# Patient Record
Sex: Male | Born: 1955 | Smoking: Former smoker
Health system: Southern US, Community
[De-identification: ages and names within clinical notes are randomized; demographics above are authoritative.]

## PROBLEM LIST (undated history)

## (undated) DIAGNOSIS — I82409 Acute embolism and thrombosis of unspecified deep veins of unspecified lower extremity: Secondary | ICD-10-CM

## (undated) DIAGNOSIS — I219 Acute myocardial infarction, unspecified: Secondary | ICD-10-CM

## (undated) DIAGNOSIS — I10 Essential (primary) hypertension: Secondary | ICD-10-CM

## (undated) DIAGNOSIS — E785 Hyperlipidemia, unspecified: Secondary | ICD-10-CM

## (undated) HISTORY — DX: Acute myocardial infarction, unspecified: I21.9

## (undated) HISTORY — DX: Essential (primary) hypertension: I10

## (undated) HISTORY — DX: Acute embolism and thrombosis of unspecified deep veins of unspecified lower extremity: I82.409

## (undated) HISTORY — DX: Hyperlipidemia, unspecified: E78.5

---

## 2013-10-25 ENCOUNTER — Non-Acute Institutional Stay (SKILLED_NURSING_FACILITY): Payer: Medicare HMO | Admitting: Adult Health

## 2013-10-25 DIAGNOSIS — I6350 Cerebral infarction due to unspecified occlusion or stenosis of unspecified cerebral artery: Secondary | ICD-10-CM

## 2013-10-25 DIAGNOSIS — I82409 Acute embolism and thrombosis of unspecified deep veins of unspecified lower extremity: Secondary | ICD-10-CM

## 2013-10-25 DIAGNOSIS — I639 Cerebral infarction, unspecified: Secondary | ICD-10-CM

## 2013-10-25 DIAGNOSIS — I10 Essential (primary) hypertension: Secondary | ICD-10-CM

## 2013-10-25 DIAGNOSIS — I2510 Atherosclerotic heart disease of native coronary artery without angina pectoris: Secondary | ICD-10-CM

## 2013-10-25 DIAGNOSIS — E785 Hyperlipidemia, unspecified: Secondary | ICD-10-CM

## 2013-10-25 DIAGNOSIS — I219 Acute myocardial infarction, unspecified: Secondary | ICD-10-CM

## 2013-10-27 ENCOUNTER — Non-Acute Institutional Stay (SKILLED_NURSING_FACILITY): Payer: Medicare HMO | Admitting: Internal Medicine

## 2013-10-27 ENCOUNTER — Encounter: Payer: Self-pay | Admitting: Internal Medicine

## 2013-10-27 DIAGNOSIS — I219 Acute myocardial infarction, unspecified: Secondary | ICD-10-CM

## 2013-10-27 DIAGNOSIS — E785 Hyperlipidemia, unspecified: Secondary | ICD-10-CM | POA: Insufficient documentation

## 2013-10-27 DIAGNOSIS — I6350 Cerebral infarction due to unspecified occlusion or stenosis of unspecified cerebral artery: Secondary | ICD-10-CM

## 2013-10-27 DIAGNOSIS — I10 Essential (primary) hypertension: Secondary | ICD-10-CM | POA: Insufficient documentation

## 2013-10-27 DIAGNOSIS — I639 Cerebral infarction, unspecified: Secondary | ICD-10-CM | POA: Insufficient documentation

## 2013-10-27 DIAGNOSIS — I82409 Acute embolism and thrombosis of unspecified deep veins of unspecified lower extremity: Secondary | ICD-10-CM

## 2013-10-27 NOTE — Progress Notes (Signed)
Patient ID: Kenneth Yu, male   DOB: 30-Aug-1955, 58 y.o.   MRN: [id number]    [facility]  Chief Complaint  Patient presents with  . Hospitalization Follow-up    new admission   No Known Allergies  HPI 58 y/o male patient is here for short term rehabilitation after hospital admission from 10/16/13- 10/21/13 with acute CVA. He had right temporal and occipital stroke. Neurology was consulted and he was started on xarelto. 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 He is seen in his room today. He denies any complaints. He has been working with therapy team  Review of Systems  Constitutional: Negative for fever, chills, weight loss, malaise/fatigue and diaphoresis.  HENT: Negative for congestion, hearing loss and sore throat.   Eyes: Negative for blurred vision, double vision and discharge.  Respiratory: Negative for cough, sputum production, shortness of breath and wheezing.   Cardiovascular: Negative for chest pain, palpitations, orthopnea and leg swelling.  Gastrointestinal: Negative for heartburn, nausea, vomiting, abdominal pain, diarrhea and constipation.  Genitourinary: Negative for dysuria, urgency, frequency and flank pain.  Musculoskeletal: Negative for back pain, falls, joint pain and myalgias.  Skin: Negative for itching and rash.  Neurological: Positive for weakness. Negative for dizziness, tingling, focal weakness and headaches.  Psychiatric/Behavioral: Negative for depression and memory loss. The patient is not nervous/anxious.    Past Medical History  Diagnosis Date  . Myocardial infarction   . Hypertension   . DVT (deep venous thrombosis)   . Hyperlipidemia    Family History  Problem Relation Age of Onset  . Diabetes Sister   . Diabetes Brother    History   Social History  . Marital Status: Unknown    Spouse Name: N/A    Number of Children: N/A  . Years of Education: N/A   Occupational History  . divorced   .  lives alone    Social History Main Topics  . Smoking status: Former Games developer  . Smokeless tobacco: Not on file  . Alcohol Use: Yes  . Drug Use: Not on file  . Sexual Activity: Not on file   Other Topics Concern  . Not on file   Social History Narrative   Divorced, lives alone, smoked in past for 91 year    Outpatient Encounter Prescriptions as of 10/27/2013  Medication Sig  . acetaminophen (TYLENOL) 325 MG tablet Take 650 mg by mouth every 4 (four) hours as needed.  Marland Kitchen aspirin 81 MG tablet Take 81 mg by mouth daily.  Marland Kitchen atorvastatin (LIPITOR) 80 MG tablet Take 80 mg by mouth daily.  . bisacodyl (DULCOLAX) 10 MG suppository Place 10 mg rectally daily as needed for moderate constipation.  . docusate sodium (COLACE) 100 MG capsule Take 100 mg by mouth 2 (two) times daily.  Marland Kitchen lisinopril (PRINIVIL,ZESTRIL) 10 MG tablet Take 10 mg by mouth daily.  . magnesium hydroxide (MILK OF MAGNESIA) 800 MG/5ML suspension Take 30 mLs by mouth daily as needed for constipation.  . Rivaroxaban (XARELTO) 20 MG TABS tablet Take 20 mg by mouth daily with supper.   Physical exam BP 130/70  Pulse 88  Temp(Src) 98.1 F (36.7 C)  Resp 18  Ht 5\' 7"  (1.702 m)  Wt 237 lb (107.502 kg)  BMI 37.11 kg/m2  SpO2 96%  General- adult male in no acute distress Head- atraumatic, normocephalic Eyes- PERRLA, EOMI, no pallor, no icterus, no discharge Neck- no lymphadenopathy, no thyromegaly, no jugular vein distension, no  carotid bruit Nose- normal nasaal mucosa, no maxillary sinus tenderness, no frontal sinus tenderness Mouth- normal mucus membrane, no oral thrush, normal oropharynx Chest- no chest wall deformities, no chest wall tenderness Cardiovascular- normal s1,s2, no murmurs/ rubs/ gallops, normal distal pulses Respiratory- bilateral clear to auscultation, no wheeze, no rhonchi, no crackles Abdomen- bowel sounds present, soft, non tender, no CVA tenderness Musculoskeletal- able to move all 4 extremities, using a  cane, no spinal and paraspinal tenderness, no leg edema, right sided weakness noted more than left Neurological- no focal deficit Skin- warm and dry Psychiatry- alert and oriented to person, place and time, normal mood and affect  Labs- none available for review  Imaging and tests performed in hospital  T.chol 130, LDL 76, tg 127 Echocardiogram- no intracardiac thrombus, EF 50% with mild LVH MRI brain- acute infarct of right posterior cerebral artery involving dorsal right thalamus and posterior limb of internal capsule Carotid doppler- no significant stenosis noted Cta neck- no acute findings  Assessment/plan  Acute cva- here for rehabilitation. Will have him work with therapy team-PT and OT for gait training and strengthening exercises. Fall precautions to be taken. Continue xarelto with aspirin and statin  Hypertension- bp stable at present. Continue lisinopril 10 mg daily with baby aspirin and monitor bp. Check cmp  Hyperlipidemia- continue lipitor. Reviewed lipid panel  Constipation- last bowel movement yesterday. Monitor bowel movement. Continue colace bid with prn suppository and MOM  CAD- remains chest pain free. Continue baby aspirin and ACEI. Monitor clinically  DVT- continue xarelto, monitor for signs of bleeding. Check cbc  Family/ staff Communication: reviewed care plan with patient and nursing supervisor  Goal of care-str  Labs- cbc, cmp

## 2013-10-28 DIAGNOSIS — I2510 Atherosclerotic heart disease of native coronary artery without angina pectoris: Secondary | ICD-10-CM | POA: Insufficient documentation

## 2013-10-28 NOTE — Progress Notes (Signed)
Patient ID: Janeece AgeeWalter Kuhar, male   DOB: 04/27/1956, 58 y.o.   MRN: [id number]     [facility]  No Known Allergies   Chief Complaint  Patient presents with  . Hospitalization Follow-up    HPI:  He has been hospitalized for a cva. He has a history of a previous cva in the past. He is here at this time for short term rehab. He is not voicing any complaints today. There are no concerns being voiced by the nursing staff at this time.    Past Medical History  Diagnosis Date  . Myocardial infarction   . Hypertension   . DVT (deep venous thrombosis)   . Hyperlipidemia     No past surgical history on file.  VITAL SIGNS BP 142/70  Pulse 60  Ht 5\' 7"  (1.702 m)  Wt 237 lb (107.502 kg)  BMI 37.11 kg/m2   Patient's Medications  New Prescriptions   No medications on file  Previous Medications   ACETAMINOPHEN (TYLENOL) 325 MG TABLET    Take 650 mg by mouth every 4 (four) hours as needed.   ASPIRIN 81 MG TABLET    Take 81 mg by mouth daily.   ATORVASTATIN (LIPITOR) 80 MG TABLET    Take 80 mg by mouth daily.   BISACODYL (DULCOLAX) 10 MG SUPPOSITORY    Place 10 mg rectally daily as needed for moderate constipation.   DOCUSATE SODIUM (COLACE) 100 MG CAPSULE    Take 100 mg by mouth 2 (two) times daily.   LISINOPRIL (PRINIVIL,ZESTRIL) 10 MG TABLET    Take 10 mg by mouth daily.   MAGNESIUM HYDROXIDE (MILK OF MAGNESIA) 800 MG/5ML SUSPENSION    Take 30 mLs by mouth daily as needed for constipation.   RIVAROXABAN (XARELTO) 20 MG TABS TABLET    Take 20 mg by mouth daily with supper.  Modified Medications   No medications on file  Discontinued Medications   No medications on file    SIGNIFICANT DIAGNOSTIC EXAMS  09/2013: 2-d echo: ef 50% mild LVH  09-2013: MRI of head: acute infract right posterior cerebral artery circulation involving the dorsal right thalamus and post limb of the internal capsule  09-2013: carotid doppler: without significant hemodynamically significant stenosis    09-2013: ct of head and neck: without acute findings.     LABS REVIEWED:   10-25-13: wbc 6.4; hgb 15.6; hct 46.0; mcv 83 plt 264; glucose 92; bun 17; creat 1.3; k+5.0; na++145 Chol 177; ldl 74; trig 288     Review of Systems  Constitutional: Negative for malaise/fatigue.  Eyes: Negative for blurred vision.  Respiratory: Negative for cough and shortness of breath.   Cardiovascular: Negative for chest pain, palpitations and leg swelling.  Gastrointestinal: Negative for heartburn, abdominal pain and constipation.  Musculoskeletal: Negative for back pain, joint pain and myalgias.  Skin: Negative.   Neurological: Negative for dizziness and headaches.  Psychiatric/Behavioral: Negative for depression. The patient is not nervous/anxious.       Physical Exam  Constitutional: He appears well-developed and well-nourished. No distress.  obese  Neck: Neck supple. No JVD present. No thyromegaly present.  Cardiovascular: Normal rate, regular rhythm and intact distal pulses.   Respiratory: Effort normal and breath sounds normal. No respiratory distress. He has no wheezes.  GI: Soft. Bowel sounds are normal. He exhibits no distension. There is no tenderness.  Musculoskeletal: He exhibits no edema.  Is able to move all extremities; has some mild generalized weakness present.   Neurological: He is  alert.  Skin: Skin is warm and dry. He is not diaphoretic.  Psychiatric: He has a normal mood and affect.       ASSESSMENT/ PLAN:  1. CVA: he is presently neurologically stable; will continue therapy as indicated; will continue xarelto 20 mg daily; asa 81 mg daily and will continue to monitor his status.   2. dvt in lower extremities; he is presently stable will continue xarelto 20 mg daily  3. Hypertension: will continue lisinopril 20 mg daily and will monitor his status   4. MI/CAD: he is presently without complaint of chest pain present will continue asa 81 mg daily   5. Dyslipidemia:  will continue lipitor 80 mg daily; his last ldl was 74      Time spent with patient 50 minutes.    Synthia Innocent NP [facility]  Contact [phone] Monday through Friday 8am- 5pm  After hours call [phone]

## 2013-11-01 ENCOUNTER — Non-Acute Institutional Stay (SKILLED_NURSING_FACILITY): Payer: Medicare HMO | Admitting: Adult Health

## 2013-11-01 ENCOUNTER — Encounter: Payer: Self-pay | Admitting: Adult Health

## 2013-11-01 DIAGNOSIS — I2510 Atherosclerotic heart disease of native coronary artery without angina pectoris: Secondary | ICD-10-CM

## 2013-11-01 DIAGNOSIS — I639 Cerebral infarction, unspecified: Secondary | ICD-10-CM

## 2013-11-01 DIAGNOSIS — I6350 Cerebral infarction due to unspecified occlusion or stenosis of unspecified cerebral artery: Secondary | ICD-10-CM

## 2013-11-01 DIAGNOSIS — I10 Essential (primary) hypertension: Secondary | ICD-10-CM

## 2013-11-01 DIAGNOSIS — I82409 Acute embolism and thrombosis of unspecified deep veins of unspecified lower extremity: Secondary | ICD-10-CM

## 2013-11-01 NOTE — Progress Notes (Signed)
Patient ID: Kenneth Yu, male   DOB: [birth year], 58 y.o.   MRN: [id number]     [facility]  No Known Allergies   Chief Complaint  Patient presents with  . Discharge Note    HPI:  He is being discharged to home. He will need home health for skilled nursing for medication management. He will need ot to improve upon his level of independence with adl's due to his cva. He will need speech therapy to improve upon his short term memory problem solving due to his cva.  He is unable to leave his home safely without assistance. He will need prescriptions to be written.     Past Medical History  Diagnosis Date  . Myocardial infarction   . Hypertension   . DVT (deep venous thrombosis)   . Hyperlipidemia     No past surgical history on file.  VITAL SIGNS BP 138/90  Pulse 70  Ht 5\' 7"  (1.702 m)  Wt 237 lb (107.502 kg)  BMI 37.11 kg/m2   Patient's Medications  New Prescriptions   No medications on file  Previous Medications   ACETAMINOPHEN (TYLENOL) 325 MG TABLET    Take 650 mg by mouth every 4 (four) hours as needed.   ASPIRIN 81 MG TABLET    Take 81 mg by mouth daily.   ATORVASTATIN (LIPITOR) 80 MG TABLET    Take 80 mg by mouth daily.   BISACODYL (DULCOLAX) 10 MG SUPPOSITORY    Place 10 mg rectally daily as needed for moderate constipation.   DOCUSATE SODIUM (COLACE) 100 MG CAPSULE    Take 100 mg by mouth 2 (two) times daily.   LISINOPRIL (PRINIVIL,ZESTRIL) 10 MG TABLET    Take 10 mg by mouth daily.   MAGNESIUM HYDROXIDE (MILK OF MAGNESIA) 800 MG/5ML SUSPENSION    Take 30 mLs by mouth daily as needed for constipation.   RIVAROXABAN (XARELTO) 20 MG TABS TABLET    Take 20 mg by mouth daily with supper.  Modified Medications   No medications on file  Discontinued Medications   No medications on file    SIGNIFICANT DIAGNOSTIC EXAMS  09/2013: 2-d echo: ef 50% mild LVH  09-2013: MRI of head: acute infract right posterior cerebral artery circulation involving the dorsal  right thalamus and post limb of the internal capsule  09-2013: carotid doppler: without significant hemodynamically significant stenosis   09-2013: ct of head and neck: without acute findings.     LABS REVIEWED:   10-25-13: wbc 6.4; hgb 15.6; hct 46.0; mcv 83 plt 264; glucose 92; bun 17; creat 1.3; k+5.0; na++145 Chol 177; ldl 74; trig 288     Review of Systems  Constitutional: Negative for malaise/fatigue.  Eyes: Negative for blurred vision.  Respiratory: Negative for cough and shortness of breath.   Cardiovascular: Negative for chest pain, palpitations and leg swelling.  Gastrointestinal: Negative for heartburn, abdominal pain and constipation.  Musculoskeletal: Negative for back pain, joint pain and myalgias.  Skin: Negative.   Neurological: Negative for dizziness and headaches.  Psychiatric/Behavioral: Negative for depression. The patient is not nervous/anxious.       Physical Exam  Constitutional: He appears well-developed and well-nourished. No distress.  obese  Neck: Neck supple. No JVD present. No thyromegaly present.  Cardiovascular: Normal rate, regular rhythm and intact distal pulses.   Respiratory: Effort normal and breath sounds normal. No respiratory distress. He has no wheezes.  GI: Soft. Bowel sounds are normal. He exhibits no distension. There is no tenderness.  Musculoskeletal: He exhibits no edema.  Is able to move all extremities Neurological: He is alert.  Skin: Skin is warm and dry. He is not diaphoretic.  Psychiatric: He has a normal mood and affect.      ASSESSMENT/ PLAN:  Will discharge to home with home health for ot/st/nursing. He will not need dme. His prescriptions have been written.   Time spent with patient 40 minutes.        Synthia Innocenteborah Zeniya Lapidus NP [facility]  Contact [phone] Monday through Friday 8am- 5pm  After hours call [phone]

## 2021-01-28 ENCOUNTER — Encounter (HOSPITAL_COMMUNITY)
Admission: EM | Disposition: A | Discharge status: Skilled Nursing Facility | Payer: Self-pay | Source: Home / Self Care | Attending: Neurology

## 2021-01-28 ENCOUNTER — Other Ambulatory Visit (HOSPITAL_COMMUNITY): Payer: Self-pay

## 2021-01-28 ENCOUNTER — Inpatient Hospital Stay (HOSPITAL_COMMUNITY): Payer: Medicare (Managed Care) | Admitting: Critical Care Medicine

## 2021-01-28 ENCOUNTER — Encounter (HOSPITAL_COMMUNITY): Payer: Self-pay | Admitting: Emergency Medicine

## 2021-01-28 ENCOUNTER — Other Ambulatory Visit: Payer: Self-pay

## 2021-01-28 ENCOUNTER — Inpatient Hospital Stay (HOSPITAL_COMMUNITY)
Admission: EM | Admit: 2021-01-28 | Discharge: 2021-02-08 | DRG: 023 | Disposition: A | Discharge status: Skilled Nursing Facility | Payer: Medicare (Managed Care) | Attending: Neurology | Admitting: Neurology

## 2021-01-28 ENCOUNTER — Emergency Department (HOSPITAL_COMMUNITY): Payer: Medicare (Managed Care)

## 2021-01-28 ENCOUNTER — Inpatient Hospital Stay (HOSPITAL_COMMUNITY): Payer: Medicare (Managed Care)

## 2021-01-28 DIAGNOSIS — I252 Old myocardial infarction: Secondary | ICD-10-CM

## 2021-01-28 DIAGNOSIS — D72829 Elevated white blood cell count, unspecified: Secondary | ICD-10-CM | POA: Diagnosis present

## 2021-01-28 DIAGNOSIS — I480 Paroxysmal atrial fibrillation: Secondary | ICD-10-CM | POA: Diagnosis present

## 2021-01-28 DIAGNOSIS — I10 Essential (primary) hypertension: Secondary | ICD-10-CM | POA: Diagnosis present

## 2021-01-28 DIAGNOSIS — Z0189 Encounter for other specified special examinations: Secondary | ICD-10-CM

## 2021-01-28 DIAGNOSIS — R471 Dysarthria and anarthria: Secondary | ICD-10-CM | POA: Diagnosis present

## 2021-01-28 DIAGNOSIS — E785 Hyperlipidemia, unspecified: Secondary | ICD-10-CM | POA: Diagnosis present

## 2021-01-28 DIAGNOSIS — G8191 Hemiplegia, unspecified affecting right dominant side: Secondary | ICD-10-CM | POA: Diagnosis present

## 2021-01-28 DIAGNOSIS — I6389 Other cerebral infarction: Secondary | ICD-10-CM | POA: Diagnosis not present

## 2021-01-28 DIAGNOSIS — Z87891 Personal history of nicotine dependence: Secondary | ICD-10-CM | POA: Diagnosis not present

## 2021-01-28 DIAGNOSIS — Z01818 Encounter for other preprocedural examination: Secondary | ICD-10-CM

## 2021-01-28 DIAGNOSIS — R0789 Other chest pain: Secondary | ICD-10-CM | POA: Diagnosis not present

## 2021-01-28 DIAGNOSIS — Z7901 Long term (current) use of anticoagulants: Secondary | ICD-10-CM | POA: Diagnosis not present

## 2021-01-28 DIAGNOSIS — Z79899 Other long term (current) drug therapy: Secondary | ICD-10-CM

## 2021-01-28 DIAGNOSIS — I611 Nontraumatic intracerebral hemorrhage in hemisphere, cortical: Secondary | ICD-10-CM | POA: Diagnosis present

## 2021-01-28 DIAGNOSIS — Z20822 Contact with and (suspected) exposure to covid-19: Secondary | ICD-10-CM | POA: Diagnosis present

## 2021-01-28 DIAGNOSIS — R0689 Other abnormalities of breathing: Secondary | ICD-10-CM | POA: Diagnosis not present

## 2021-01-28 DIAGNOSIS — R519 Headache, unspecified: Secondary | ICD-10-CM | POA: Diagnosis not present

## 2021-01-28 DIAGNOSIS — Z86718 Personal history of other venous thrombosis and embolism: Secondary | ICD-10-CM

## 2021-01-28 DIAGNOSIS — R531 Weakness: Secondary | ICD-10-CM | POA: Diagnosis not present

## 2021-01-28 DIAGNOSIS — J9601 Acute respiratory failure with hypoxia: Secondary | ICD-10-CM | POA: Diagnosis not present

## 2021-01-28 DIAGNOSIS — I63412 Cerebral infarction due to embolism of left middle cerebral artery: Secondary | ICD-10-CM | POA: Diagnosis present

## 2021-01-28 DIAGNOSIS — R4702 Dysphasia: Secondary | ICD-10-CM | POA: Diagnosis not present

## 2021-01-28 DIAGNOSIS — I639 Cerebral infarction, unspecified: Secondary | ICD-10-CM | POA: Diagnosis present

## 2021-01-28 DIAGNOSIS — R1310 Dysphagia, unspecified: Secondary | ICD-10-CM | POA: Diagnosis present

## 2021-01-28 DIAGNOSIS — I2510 Atherosclerotic heart disease of native coronary artery without angina pectoris: Secondary | ICD-10-CM | POA: Diagnosis present

## 2021-01-28 DIAGNOSIS — Z833 Family history of diabetes mellitus: Secondary | ICD-10-CM

## 2021-01-28 DIAGNOSIS — Z8673 Personal history of transient ischemic attack (TIA), and cerebral infarction without residual deficits: Secondary | ICD-10-CM

## 2021-01-28 DIAGNOSIS — I6602 Occlusion and stenosis of left middle cerebral artery: Secondary | ICD-10-CM | POA: Diagnosis present

## 2021-01-28 DIAGNOSIS — E7800 Pure hypercholesterolemia, unspecified: Secondary | ICD-10-CM | POA: Diagnosis not present

## 2021-01-28 DIAGNOSIS — I63512 Cerebral infarction due to unspecified occlusion or stenosis of left middle cerebral artery: Secondary | ICD-10-CM | POA: Diagnosis not present

## 2021-01-28 DIAGNOSIS — R29720 NIHSS score 20: Secondary | ICD-10-CM | POA: Diagnosis present

## 2021-01-28 DIAGNOSIS — R4701 Aphasia: Secondary | ICD-10-CM | POA: Diagnosis present

## 2021-01-28 DIAGNOSIS — M6282 Rhabdomyolysis: Secondary | ICD-10-CM | POA: Diagnosis present

## 2021-01-28 DIAGNOSIS — Z7982 Long term (current) use of aspirin: Secondary | ICD-10-CM | POA: Diagnosis not present

## 2021-01-28 HISTORY — PX: IR PERCUTANEOUS ART THROMBECTOMY/INFUSION INTRACRANIAL INC DIAG ANGIO: IMG6087

## 2021-01-28 HISTORY — PX: IR CT HEAD LTD: IMG2386

## 2021-01-28 HISTORY — PX: RADIOLOGY WITH ANESTHESIA: SHX6223

## 2021-01-28 LAB — COMPREHENSIVE METABOLIC PANEL
ALT: 40 U/L (ref 0–44)
AST: 91 U/L — ABNORMAL HIGH (ref 15–41)
Albumin: 3.7 g/dL (ref 3.5–5.0)
Alkaline Phosphatase: 46 U/L (ref 38–126)
Anion gap: 11 (ref 5–15)
BUN: 13 mg/dL (ref 8–23)
CO2: 26 mmol/L (ref 22–32)
Calcium: 9.1 mg/dL (ref 8.9–10.3)
Chloride: 105 mmol/L (ref 98–111)
Creatinine, Ser: 1.13 mg/dL (ref 0.61–1.24)
GFR, Estimated: >60 mL/min (ref >60)
Glucose, Bld: 101 mg/dL — ABNORMAL HIGH (ref 70–99)
Potassium: 3.9 mmol/L (ref 3.5–5.1)
Sodium: 142 mmol/L (ref 135–145)
Total Bilirubin: 1.3 mg/dL — ABNORMAL HIGH (ref 0.3–1.2)
Total Protein: 6.5 g/dL (ref 6.5–8.1)

## 2021-01-28 LAB — I-STAT CHEM 8, ED
BUN: 13 mg/dL (ref 8–23)
Calcium, Ion: 1.17 mmol/L (ref 1.15–1.40)
Chloride: 105 mmol/L (ref 98–111)
Creatinine, Ser: 1 mg/dL (ref 0.61–1.24)
Glucose, Bld: 96 mg/dL (ref 70–99)
HCT: 49 % (ref 39.0–52.0)
Hemoglobin: 16.7 g/dL (ref 13.0–17.0)
Potassium: 3.8 mmol/L (ref 3.5–5.1)
Sodium: 143 mmol/L (ref 135–145)
TCO2: 25 mmol/L (ref 22–32)

## 2021-01-28 LAB — PROTIME-INR
INR: 1.1 (ref 0.8–1.2)
Prothrombin Time: 13.8 seconds (ref 11.4–15.2)

## 2021-01-28 LAB — DIFFERENTIAL
Abs Immature Granulocytes: 0.06 10*3/uL (ref 0.00–0.07)
Basophils Absolute: 0 10*3/uL (ref 0.0–0.1)
Basophils Relative: 0 %
Eosinophils Absolute: 0.3 10*3/uL (ref 0.0–0.5)
Eosinophils Relative: 3 %
Immature Granulocytes: 1 %
Lymphocytes Relative: 6 %
Lymphs Abs: 0.7 10*3/uL (ref 0.7–4.0)
Monocytes Absolute: 0.7 10*3/uL (ref 0.1–1.0)
Monocytes Relative: 6 %
Neutro Abs: 10.3 10*3/uL — ABNORMAL HIGH (ref 1.7–7.7)
Neutrophils Relative %: 84 %

## 2021-01-28 LAB — CBG MONITORING, ED: Glucose-Capillary: 94 mg/dL (ref 70–99)

## 2021-01-28 LAB — CBC
HCT: 44.5 % (ref 39.0–52.0)
Hemoglobin: 15.3 g/dL (ref 13.0–17.0)
MCH: 29.4 pg (ref 26.0–34.0)
MCHC: 34.4 g/dL (ref 30.0–36.0)
MCV: 85.4 fL (ref 80.0–100.0)
Platelets: 249 10*3/uL (ref 150–400)
RBC: 5.21 MIL/uL (ref 4.22–5.81)
RDW: 14.1 % (ref 11.5–15.5)
WBC: 12.1 10*3/uL — ABNORMAL HIGH (ref 4.0–10.5)
nRBC: 0 % (ref 0.0–0.2)

## 2021-01-28 LAB — RESP PANEL BY RT-PCR (FLU A&B, COVID) ARPGX2
Influenza A by PCR: NEGATIVE
Influenza B by PCR: NEGATIVE
SARS Coronavirus 2 by RT PCR: NEGATIVE

## 2021-01-28 LAB — APTT: aPTT: 24 seconds (ref 24–36)

## 2021-01-28 LAB — MRSA NEXT GEN BY PCR, NASAL: MRSA by PCR Next Gen: NOT DETECTED

## 2021-01-28 LAB — HIV ANTIBODY (ROUTINE TESTING W REFLEX): HIV Screen 4th Generation wRfx: NONREACTIVE

## 2021-01-28 LAB — CK: Total CK: 5752 U/L — ABNORMAL HIGH (ref 49–397)

## 2021-01-28 SURGERY — IR WITH ANESTHESIA
Anesthesia: General

## 2021-01-28 MED ORDER — STROKE: EARLY STAGES OF RECOVERY BOOK
Freq: Once | Status: AC
  Filled 2021-01-28: qty 1

## 2021-01-28 MED ORDER — PROPOFOL 10 MG/ML IV BOLUS
INTRAVENOUS | Status: DC | PRN
  Administered 2021-01-28: 150 mg via INTRAVENOUS

## 2021-01-28 MED ORDER — IOHEXOL 300 MG/ML  SOLN
100.0000 mL | Freq: Once | INTRAMUSCULAR | Status: AC | PRN
  Administered 2021-01-28: 50 mL via INTRA_ARTERIAL

## 2021-01-28 MED ORDER — SODIUM CHLORIDE 0.9% FLUSH
3.0000 mL | Freq: Once | INTRAVENOUS | Status: AC
  Administered 2021-01-28: 3 mL via INTRAVENOUS

## 2021-01-28 MED ORDER — SODIUM CHLORIDE (PF) 0.9 % IJ SOLN
INTRAVENOUS | Status: AC | PRN
  Administered 2021-01-28 (×5): 25 ug via INTRA_ARTERIAL

## 2021-01-28 MED ORDER — ACETAMINOPHEN 325 MG PO TABS
650.0000 mg | ORAL_TABLET | ORAL | Status: DC | PRN
  Administered 2021-01-29 – 2021-02-06 (×7): 650 mg via ORAL
  Filled 2021-01-28 (×8): qty 2

## 2021-01-28 MED ORDER — ACETAMINOPHEN 160 MG/5ML PO SOLN: 650.0000 mg | ORAL | Status: DC | PRN

## 2021-01-28 MED ORDER — IOHEXOL 350 MG/ML SOLN
80.0000 mL | Freq: Once | INTRAVENOUS | Status: AC | PRN
  Administered 2021-01-28: 80 mL via INTRAVENOUS

## 2021-01-28 MED ORDER — ASPIRIN 300 MG RE SUPP
300.0000 mg | Freq: Every day | RECTAL | Status: DC
  Administered 2021-01-28 – 2021-01-29 (×2): 300 mg via RECTAL
  Filled 2021-01-28 (×3): qty 1

## 2021-01-28 MED ORDER — PROPOFOL 1000 MG/100ML IV EMUL
5.0000 ug/kg/min | INTRAVENOUS | Status: DC
Start: 2021-01-28 — End: 2021-01-29
  Administered 2021-01-28: 20 ug/kg/min via INTRAVENOUS
  Administered 2021-01-29: 40 ug/kg/min via INTRAVENOUS
  Filled 2021-01-28 (×3): qty 100

## 2021-01-28 MED ORDER — ONDANSETRON HCL 4 MG/2ML IJ SOLN
INTRAMUSCULAR | Status: DC | PRN
  Administered 2021-01-28: 4 mg via INTRAVENOUS

## 2021-01-28 MED ORDER — ACETAMINOPHEN 160 MG/5ML PO SOLN
650.0000 mg | ORAL | Status: DC | PRN
  Administered 2021-01-28: 650 mg
  Filled 2021-01-28: qty 20.3

## 2021-01-28 MED ORDER — CEFAZOLIN SODIUM-DEXTROSE 2-4 GM/100ML-% IV SOLN
INTRAVENOUS | Status: AC
  Filled 2021-01-28: qty 100

## 2021-01-28 MED ORDER — NITROGLYCERIN 1 MG/10 ML FOR IR/CATH LAB
INTRA_ARTERIAL | Status: AC
  Filled 2021-01-28: qty 10

## 2021-01-28 MED ORDER — ACETAMINOPHEN 325 MG PO TABS: 650.0000 mg | ORAL_TABLET | ORAL | Status: DC | PRN

## 2021-01-28 MED ORDER — LACTATED RINGERS IV SOLN: INTRAVENOUS | Status: DC | PRN

## 2021-01-28 MED ORDER — LIDOCAINE 2% (20 MG/ML) 5 ML SYRINGE
INTRAMUSCULAR | Status: DC | PRN
  Administered 2021-01-28: 80 mg via INTRAVENOUS

## 2021-01-28 MED ORDER — SODIUM CHLORIDE 0.9 % IV SOLN: INTRAVENOUS | Status: DC

## 2021-01-28 MED ORDER — PHENYLEPHRINE HCL-NACL 10-0.9 MG/250ML-% IV SOLN
INTRAVENOUS | Status: DC | PRN
  Administered 2021-01-28: 10 ug/min via INTRAVENOUS

## 2021-01-28 MED ORDER — CEFAZOLIN SODIUM-DEXTROSE 2-3 GM-%(50ML) IV SOLR
INTRAVENOUS | Status: DC | PRN
  Administered 2021-01-28: 2 g via INTRAVENOUS

## 2021-01-28 MED ORDER — CHLORHEXIDINE GLUCONATE 0.12% ORAL RINSE (MEDLINE KIT)
15.0000 mL | Freq: Two times a day (BID) | OROMUCOSAL | Status: DC
  Administered 2021-01-28 – 2021-02-08 (×21): 15 mL via OROMUCOSAL

## 2021-01-28 MED ORDER — ORAL CARE MOUTH RINSE
15.0000 mL | OROMUCOSAL | Status: DC
  Administered 2021-01-28 – 2021-01-30 (×17): 15 mL via OROMUCOSAL

## 2021-01-28 MED ORDER — CLEVIDIPINE BUTYRATE 0.5 MG/ML IV EMUL
0.0000 mg/h | INTRAVENOUS | Status: AC
  Administered 2021-01-28: 2 mg/h via INTRAVENOUS
  Administered 2021-01-29 (×2): 18 mg/h via INTRAVENOUS
  Administered 2021-01-29: 2 mg/h via INTRAVENOUS
  Administered 2021-01-29: 18 mg/h via INTRAVENOUS
  Administered 2021-01-29: 16 mg/h via INTRAVENOUS
  Filled 2021-01-28 (×7): qty 50

## 2021-01-28 MED ORDER — ACETAMINOPHEN 650 MG RE SUPP
650.0000 mg | RECTAL | Status: DC | PRN
  Administered 2021-02-01: 650 mg via RECTAL
  Filled 2021-01-28: qty 1

## 2021-01-28 MED ORDER — FENTANYL 2500MCG IN NS 250ML (10MCG/ML) PREMIX INFUSION
INTRAVENOUS | Status: AC
  Administered 2021-01-28: 50 ug/h via INTRAVENOUS
  Filled 2021-01-28: qty 250

## 2021-01-28 MED ORDER — ROCURONIUM BROMIDE 10 MG/ML (PF) SYRINGE
PREFILLED_SYRINGE | INTRAVENOUS | Status: DC | PRN
  Administered 2021-01-28: 50 mg via INTRAVENOUS

## 2021-01-28 MED ORDER — ACETAMINOPHEN 650 MG RE SUPP: 650.0000 mg | RECTAL | Status: DC | PRN

## 2021-01-28 MED ORDER — DEXAMETHASONE SODIUM PHOSPHATE 10 MG/ML IJ SOLN
INTRAMUSCULAR | Status: DC | PRN
  Administered 2021-01-28: 4 mg via INTRAVENOUS

## 2021-01-28 MED ORDER — FAMOTIDINE IN NACL 20-0.9 MG/50ML-% IV SOLN
20.0000 mg | Freq: Two times a day (BID) | INTRAVENOUS | Status: DC
  Administered 2021-01-28 – 2021-01-29 (×3): 20 mg via INTRAVENOUS
  Filled 2021-01-28 (×4): qty 50

## 2021-01-28 MED ORDER — CHLORHEXIDINE GLUCONATE CLOTH 2 % EX PADS
6.0000 | MEDICATED_PAD | Freq: Every day | CUTANEOUS | Status: DC
  Administered 2021-01-29 – 2021-02-01 (×4): 6 via TOPICAL

## 2021-01-28 MED ORDER — FENTANYL 2500MCG IN NS 250ML (10MCG/ML) PREMIX INFUSION: 0.0000 ug/h | INTRAVENOUS | Status: DC

## 2021-01-28 MED ORDER — IOHEXOL 300 MG/ML  SOLN
50.0000 mL | Freq: Once | INTRAMUSCULAR | Status: AC | PRN
  Administered 2021-01-28: 10 mL via INTRA_ARTERIAL

## 2021-01-28 MED ORDER — SUCCINYLCHOLINE CHLORIDE 200 MG/10ML IV SOSY
PREFILLED_SYRINGE | INTRAVENOUS | Status: DC | PRN
  Administered 2021-01-28: 120 mg via INTRAVENOUS

## 2021-01-28 MED ORDER — ALBUTEROL SULFATE (2.5 MG/3ML) 0.083% IN NEBU: 2.5000 mg | INHALATION_SOLUTION | RESPIRATORY_TRACT | Status: DC | PRN

## 2021-01-28 MED ORDER — PROPOFOL 500 MG/50ML IV EMUL
INTRAVENOUS | Status: DC | PRN
  Administered 2021-01-28: 50 ug/kg/min via INTRAVENOUS

## 2021-01-28 NOTE — Progress Notes (Signed)
RT assisted with transportation of this pt from PACU to 4N19 while on full ventilatory support with no complications and SVS. RN at bedside at this time.

## 2021-01-28 NOTE — Sedation Documentation (Signed)
Spoke with Susie, RN, 4N Charge, not bed available at this time. Pt will go to PACU.

## 2021-01-28 NOTE — Anesthesia Preprocedure Evaluation (Signed)
Anesthesia Evaluation  Patient identified by MRN, date of birth, ID band Patient awake    Reviewed: Allergy & Precautions, NPO status , Patient's Chart, lab work & pertinent test results  Airway Mallampati: II  TM Distance: >3 FB     Dental   Pulmonary former smoker,    breath sounds clear to auscultation       Cardiovascular hypertension, + CAD and + Past MI   Rhythm:Regular Rate:Normal     Neuro/Psych CVA    GI/Hepatic   Endo/Other    Renal/GU      Musculoskeletal   Abdominal   Peds  Hematology   Anesthesia Other Findings   Reproductive/Obstetrics                             Anesthesia Physical Anesthesia Plan  ASA: 3 and emergent  Anesthesia Plan: General   Post-op Pain Management:    Induction: Intravenous  PONV Risk Score and Plan: 2 and Ondansetron and Dexamethasone  Airway Management Planned: Oral ETT  Additional Equipment:   Intra-op Plan:   Post-operative Plan: Possible Post-op intubation/ventilation  Informed Consent:   Plan Discussed with: CRNA, Anesthesiologist and Surgeon  Anesthesia Plan Comments:         Anesthesia Quick Evaluation

## 2021-01-28 NOTE — Anesthesia Postprocedure Evaluation (Signed)
Anesthesia Post Note  Patient: Adan Baehr  Procedure(s) Performed: IR WITH ANESTHESIA -     Patient location during evaluation: PACU Anesthesia Type: General Level of consciousness: patient remains intubated per anesthesia plan Pain management: pain level controlled Vital Signs Assessment: post-procedure vital signs reviewed and stable Respiratory status: patient remains intubated per anesthesia plan Cardiovascular status: stable Postop Assessment: no apparent nausea or vomiting Anesthetic complications: no   No notable events documented.  Last Vitals:  Vitals:   01/28/21 1642 01/28/21 1644  BP: (!) 149/91 (!) 149/91  Pulse: 78 74  Resp: 15 15  Temp:  37.2 C  SpO2: 100% 100%    Last Pain:  Vitals:   01/28/21 1629  PainSc: Asleep                 Deiondra Denley

## 2021-01-28 NOTE — H&P (Signed)
Neurology H&P   CC: Right sided weakness   History is obtained from: EMS   HPI: Kenneth Yu is a 65 y.o. male who was last known well at 1900 on 07/03. He spoke on the phone with clear speech at that time. This morning, his ex-wife went to see him and found him aphasic with right sided weakness. He has a history of stroke, but was able to live alone, though he did have people who would check on him.  Due to his deficits, he was taken for an emergent CT/CTA/CTP.  There was infarct on the CT, but it seems there is a large area of penumbra and the CT perfusion is favorable for intervention.  He was therefore taken for emergent thrombectomy.  I tried calling his daughter and brother but was unable to reach them and therefore the procedure was done with emergency consent.    He has a history of atrial fibrillation and is supposed to be on anticoagulation with Coumadin, but apparently EMS found multiple bottles strewn around and he looks to be noncompliant per EMS assessment.   LKW: 1900 tpa given?: no, out of window.  Premorbid modified rankin scale: unclear but lives alone. NIHSS: 20   ROS: Unable to obtain due to altered mental status.   Past Medical History:  Diagnosis Date   DVT (deep venous thrombosis) (HCC)    Hyperlipidemia    Hypertension    Myocardial infarction [facility])      Family History  Problem Relation Age of Onset   Diabetes Sister    Diabetes Brother      Social History:  reports that he has quit smoking. He does not have any smokeless tobacco history on file. He reports current alcohol use. No history on file for drug use.      Exam: Current vital signs: BP (!) 170/101   Pulse (!) 106   Resp (!) 24   Wt 94 kg   SpO2 95%   BMI 32.46 kg/m    Physical Exam  Constitutional: Appears well-developed and well-nourished.  Psych: Affect appropriate to situation Eyes: No scleral injection HENT: No OP obstrucion Head: Normocephalic.  Cardiovascular: Normal  rate and regular rhythm.  Respiratory: Effort normal and breath sounds normal to anterior ascultation GI: Soft.  No distension. There is no tenderness.  Skin: He has some erythema over the left leg, likely from being down.  Neuro: Mental Status: Patient is awake, alert, densely aphasic, when asked to show thumb he shows his whole hand. Cranial Nerves: II: Visual Fields are full. Pupils are equal, round, and reactive to light. III,IV, VI: EOMI without ptosis or diploplia. V: Facial sensation is symmetric to temperature VII: Facial movement is symmetric. VIII: hearing is intact to voice X: Uvula elevates symmetrically XI: Shoulder shrug is symmetric. XII: tongue is midline without atrophy or fasciculations. Motor: Tone is normal. Bulk is normal. 5/5 strength was present on the left.  He has no movement on the right Sensory: He responds less on the right than left.  Cerebellar: Does not perform.   I have reviewed labs in epic and the pertinent results are: CMP is relatively unremarkable other than a mildly elevated AST. CBC with mild leukocytosis  I have reviewed the images obtained: CT head/CTA/CT perfusion-M2 occlusion with favorable CTP profile  Primary Diagnosis:  Cerebral infarction due to embolism of  left middle cerebral artery.   Secondary Diagnosis: Paroxysmal atrial fibrillation Essential hypertension  Impression: 65 year old male with embolic left MCA stroke in  the setting of a history of paroxysmal atrial fibrillation, supposed to be on anticoagulation with Coumadin though of presumed noncompliance given normal INR.  He is going for emergent thrombectomy.  Plan: - HgbA1c, fasting lipid panel - MRI  of the brain without contrast - Frequent neuro checks - Echocardiogram -Aspirin 325 mg p.o. or 300 mg PR - Risk factor modification - Telemetry monitoring - PT consult, OT consult, Speech consult - Stroke team to follow    This patient is critically ill and at  significant risk of neurological worsening, death and care requires constant monitoring of vital signs, hemodynamics,respiratory and cardiac monitoring, neurological assessment, discussion with family, other specialists and medical decision making of high complexity. I spent 55 minutes of neurocritical care time  in the care of  this patient. This was time spent independent of any time provided by nurse practitioner or PA.  Ritta Slot, MD [facility] [phone]  If 7pm- 7am, please page neurology on call as listed in AMION.

## 2021-01-28 NOTE — Anesthesia Procedure Notes (Addendum)
Procedure Name: Intubation Date/Time: 01/28/2021 1:16 PM Performed by: Rachel Moulds, CRNA Pre-anesthesia Checklist: Patient identified, Emergency Drugs available, Suction available and Patient being monitored Patient Re-evaluated:Patient Re-evaluated prior to induction Oxygen Delivery Method: Circle system utilized Preoxygenation: Pre-oxygenation with 100% oxygen Induction Type: IV induction Ventilation: Mask ventilation without difficulty Laryngoscope Size: Glidescope Grade View: Grade I Tube type: Oral Tube size: 8.0 mm Number of attempts: 1 Airway Equipment and Method: Stylet and Oral airway Placement Confirmation: ETT inserted through vocal cords under direct vision, positive ETCO2 and breath sounds checked- equal and bilateral Secured at: 25 cm Tube secured with: Tape Dental Injury: Teeth and Oropharynx as per pre-operative assessment

## 2021-01-28 NOTE — ED Notes (Signed)
TO IR NOW 

## 2021-01-28 NOTE — Consult Note (Signed)
NAME:  Kenneth Yu, MRN:  [id number], DOB:  [birth year], LOS: 0 ADMISSION DATE:  01/28/2021, CONSULTATION DATE: 01/28/2021 REFERRING MD:  Julieanne Cotton, MD , CHIEF COMPLAINT: Right-sided weakness  History of Present Illness:  Patient is currently sedated and intubated so most of the history is obtained from chart review 65 year old male with history of hypertension and hyperlipidemia who was brought in the emergency department with complaint of right-sided weakness and aphasia. Stroke code was called, patient had CT head which was negative for intracranial hemorrhage, positive for left MCA stroke, CTA head and neck showed M2 occlusion, patient did not receive tPA because he was out of window, he underwent thrombectomy with TICI 3 results, postprocedure patient remained on ventilator, PCCM was consulted for help with evaluation and management  Pertinent  Medical History   Past Medical History:  Diagnosis Date   DVT (deep venous thrombosis) (HCC)    Hyperlipidemia    Hypertension    Myocardial infarction (HCC)      Significant Hospital Events: Including procedures, antibiotic start and stop dates in addition to other pertinent events   Underwent mechanical thrombectomy  Interim History / Subjective:    Objective   Blood pressure (!) 155/99, pulse 82, temperature 98.6 F (37 C), resp. rate 15, weight 94 kg, SpO2 100 %.    Vent Mode: PRVC FiO2 (%):  [50 %-100 %] 50 % Set Rate:  [15 bmp-18 bmp] 15 bmp Vt Set:  [560 mL] 560 mL PEEP:  [5 cmH20] 5 cmH20 Plateau Pressure:  [17 cmH20] 17 cmH20   Intake/Output Summary (Last 24 hours) at 01/28/2021 1550 Last data filed at 01/28/2021 1519 Gross per 24 hour  Intake --  Output 15 ml  Net -15 ml   Filed Weights   01/28/21 1200  Weight: 94 kg    Examination: Physical exam: General: Crtitically ill-appearing male, orally intubated HEENT: [place]/AT, eyes anicteric.  ETT and OGT in place Neuro: Sedated, not following commands.  Eyes  are closed.  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 Acute respiratory insufficiency postprocedure Hypertension Hyperlipidemia History of DVT on anticoagulation  Admit to neuro ICU Continue neuro watch every hour Stroke team is following Continue secondary stroke prophylaxis Continue lung protective ventilation VAP bundle Minimize sedation with RASS goal -1/-2 until he gets to the ICU Closely monitor blood pressure, SBP goal 120-140 Continue atorvastatin Patient was on Xarelto, which will be kept on hold for now, will resume once stable  Best Practice (right click and "Reselect all SmartList Selections" daily)   Diet/type: NPO DVT prophylaxis: DOAC GI prophylaxis: H2B Lines: N/A Foley:  N/A Code Status:  full code Last date of multidisciplinary goals of care discussion [Per primary team]  Labs   CBC: Recent Labs  Lab 01/28/21 1210 01/28/21 1218  WBC 12.1*  --   NEUTROABS 10.3*  --   HGB 15.3 16.7  HCT 44.5 49.0  MCV 85.4  --   PLT 249  --     Basic Metabolic Panel: Recent Labs  Lab 01/28/21 1210 01/28/21 1218  NA 142 143  K 3.9 3.8  CL 105 105  CO2 26  --   GLUCOSE 101* 96  BUN 13 13  CREATININE 1.13 1.00  CALCIUM 9.1  --    GFR: CrCl cannot be calculated (Unknown ideal weight.). Recent Labs  Lab 01/28/21 1210  WBC 12.1*    Liver Function Tests: Recent Labs  Lab 01/28/21 1210  AST 91*  ALT 40  ALKPHOS 46  BILITOT 1.3*  PROT 6.5  ALBUMIN 3.7   No results for input(s): LIPASE, AMYLASE in the last 168 hours. No results for input(s): AMMONIA in the last 168 hours.  ABG    Component Value Date/Time   TCO2 25 01/28/2021 1218     Coagulation  Profile: Recent Labs  Lab 01/28/21 1210  INR 1.1    Cardiac Enzymes: No results for input(s): CKTOTAL, CKMB, CKMBINDEX, TROPONINI in the last 168 hours.  HbA1C: No results found for: HGBA1C  CBG: Recent Labs  Lab 01/28/21 1208  GLUCAP 94    Review of Systems:   Unable to obtain as patient is intubated and sedated  Past Medical History:  He,  has a past medical history of DVT (deep venous thrombosis) (HCC), Hyperlipidemia, Hypertension, and Myocardial infarction (HCC).   Surgical History:  No past surgical history on file.   Social History:   reports that he has quit smoking. He does not have any smokeless tobacco history on file. He reports current alcohol use.   Family History:  His family history includes Diabetes in his brother and sister.   Allergies No Known Allergies   Home Medications  Prior to Admission medications   Medication Sig Start Date End Date Taking? Authorizing Provider  acetaminophen (TYLENOL) 325 MG tablet Take 650 mg by mouth every 4 (four) hours as needed.    [provider]  aspirin 81 MG tablet Take 81 mg by mouth daily.    [provider]  atorvastatin (LIPITOR) 80 MG tablet Take 80 mg by mouth daily.    [provider]  bisacodyl (DULCOLAX) 10 MG suppository Place 10 mg rectally daily as needed for moderate constipation.    [provider]  docusate sodium (COLACE) 100 MG capsule Take 100 mg by mouth 2 (two) times daily.    [provider]  lisinopril (PRINIVIL,ZESTRIL) 10 MG tablet Take 10 mg by mouth daily.    [provider]  magnesium hydroxide (MILK OF MAGNESIA) 800 MG/5ML suspension Take 30 mLs by mouth daily as needed for constipation.    [provider]  rivaroxaban (XARELTO) 20 MG TABS tablet Take 20 mg by mouth daily with supper.    [provider]     Total critical care time: 35 minutes  Performed by: Cheri Fowler   Critical care time was exclusive of  separately billable procedures and treating other patients.   Critical care was necessary to treat or prevent imminent or life-threatening deterioration.   Critical care was time spent personally by me on the following activities: development of treatment plan with patient and/or surrogate as well as nursing, discussions with consultants, evaluation of patient's response to treatment, examination of patient, obtaining history from patient or surrogate, ordering and performing treatments and interventions, ordering and review of laboratory studies, ordering and review of radiographic studies, pulse oximetry and re-evaluation of patient's condition.   Cheri Fowler MD [facility] See Amion for pager If no response to pager, please call [phone] until 7pm After 7pm, Please call E-link [phone]

## 2021-01-28 NOTE — Procedures (Signed)
S/P Lt common carotid arteriogram . RT CFAapproach  S/P complete revascularization of occluded Lt MCA M2 branch of  Sup division with x 1 pass with 4 mm x 40 mm solitaireX retriever and contact aspiration acieving a TICI 3 revascularization. Post CT No ICH  21F sheath removed. Distal pulses dopplerable bilaterally, less prominent RT DP Patient left intubated awaiting Covid test results. S.Nicolette Gieske MD

## 2021-01-28 NOTE — Sedation Documentation (Signed)
SBAR given to [employer], Charity fundraiser in PACU. Groin and pulses assessed. No change, see flowsheet.

## 2021-01-28 NOTE — Sedation Documentation (Signed)
Spoke with Dorene Grebe, RN in PACU to inform that pt will come to PACU intubated to await 4N bed.

## 2021-01-28 NOTE — Sedation Documentation (Signed)
Spoke with Margo in pt placement. She is aware of need for 4N bed. No bed available at this time, but she is transferring pts to make one.

## 2021-01-28 NOTE — Transfer of Care (Signed)
Immediate Anesthesia Transfer of Care Note  Patient: Kenneth Yu  Procedure(s) Performed: IR WITH ANESTHESIA -  Patient Location: PACU  Anesthesia Type:General  Level of Consciousness: sedated and Patient remains intubated per anesthesia plan  Airway & Oxygen Therapy: Patient remains intubated per anesthesia plan and Patient placed on Ventilator (see vital sign flow sheet for setting)  Post-op Assessment: Report given to RN and Post -op Vital signs reviewed and stable  Post vital signs: Reviewed and stable  Last Vitals:  Vitals Value Taken Time  BP 164/102 01/28/21 1514  Temp    Pulse 84 01/28/21 1517  Resp 18 01/28/21 1517  SpO2 100 % 01/28/21 1517  Vitals shown include unvalidated device data.  Last Pain:  Vitals:   01/28/21 1239  PainSc: 0-No pain         Complications: No notable events documented.

## 2021-01-28 NOTE — ED Triage Notes (Signed)
Patient BIB [facility] from home. Per EMS patient was found by ex wife this morning, seen through a storm door at the house. LKW 1900 last night. EMS notes left sided facial droop and right sided weakness. VSS. 18G lac.

## 2021-01-28 NOTE — Code Documentation (Addendum)
Pt is a 65 yr old male BIB EMS at 1205 for evaluation of right sided weakness and aphasia. Kenneth Yu was known to be well last night at 1900, but was found down and unable to speak or move his right side this morning. Per EMS, he lives alone and is usually independent, but his neighbors and ex-wife check on him at times. Pt was cleared at bridge by EDP, CBG and blood work obtained. He was then taken to [facility] at 1208. Pt was still unable to speak. He was very weak on right side and had right Hemianopsia. (See NIHSS for details). CTH negative for hemorrhage per Dr Amada Jupiter. CTA and P was then performed. Per neurologist, CTA positive for an LVO (LM2), with 11cc core and 28cc pneumbra. [facility] called by neurologist at 1246 to change pt to a Code IR. Pt taken to [facility] by [facility] and [facility] at 1253. Handoff given to CRNA and ED RN Alycia Rossetti. No TPA as pt outside of treatment window. Per Dr Amada Jupiter, this case is to be done as Emergent Consent as no family could be reached by phone.

## 2021-01-28 NOTE — ED Provider Notes (Signed)
[facility] EMERGENCY DEPARTMENT Provider Note   CSN: [id number] Arrival date & time: 01/28/21  1205  An emergency department physician performed an initial assessment on this suspected stroke patient at 1207.  History Chief Complaint  Patient presents with   Code Stroke    Kenneth Yu is a 65 y.o. male.  The history is provided by the EMS personnel and medical records.  Kenneth Yu is a 65 y.o. male who presents to the Emergency Department complaining of AMS.  Level V caveat due to aphasia.  Hx is provided by EMS. He lives at home alone. He was found on the floor today with aphasia had right-sided weakness. Last known well at 7 PM.    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     Family History  Problem Relation Age of Onset   Diabetes Sister    Diabetes Brother     Social History   Tobacco Use   Smoking status: Former    Pack years: 0.00  Substance Use Topics   Alcohol use: Yes    Home Medications Prior to Admission medications   Medication Sig Start Date End Date Taking? Authorizing Provider  acetaminophen (TYLENOL) 325 MG tablet Take 650 mg by mouth every 4 (four) hours as needed.    [provider]  aspirin 81 MG tablet Take 81 mg by mouth daily.    [provider]  atorvastatin (LIPITOR) 80 MG tablet Take 80 mg by mouth daily.    [provider]  bisacodyl (DULCOLAX) 10 MG suppository Place 10 mg rectally daily as needed for moderate constipation.    [provider]   docusate sodium (COLACE) 100 MG capsule Take 100 mg by mouth 2 (two) times daily.    [provider]  lisinopril (PRINIVIL,ZESTRIL) 10 MG tablet Take 10 mg by mouth daily.    [provider]  magnesium hydroxide (MILK OF MAGNESIA) 800 MG/5ML suspension Take 30 mLs by mouth daily as needed for constipation.    [provider]  rivaroxaban (XARELTO) 20 MG TABS tablet Take 20 mg by mouth daily with supper.    [provider]    Allergies    Patient has no known allergies.  Review of Systems   Review of Systems  Unable to perform ROS: Mental status change   Physical Exam Updated Vital Signs BP (!) 152/92   Pulse 74   Temp 98.6 F (37 C)   Resp 15   Wt 94 kg   SpO2 100%   BMI 32.46 kg/m   Physical Exam Vitals and nursing note reviewed.  Constitutional:      Appearance: He is well-developed.  HENT:     Head: Normocephalic and atraumatic.  Cardiovascular:     Rate and Rhythm: Regular rhythm. Tachycardia present.  Pulmonary:     Effort: Pulmonary effort is normal. No respiratory distress.  Abdominal:     Palpations: Abdomen is soft.     Tenderness: There is no abdominal tenderness.  There is no guarding or rebound.  Musculoskeletal:        General: No tenderness.  Skin:    General: Skin is warm and dry.  Neurological:     Mental Status: He is alert.     Comments: Dense aphasia. Right hemiparesis.  Psychiatric:        Behavior: Behavior normal.    ED Results / Procedures / Treatments   Labs (all labs ordered are listed, but only abnormal results are displayed) Labs Reviewed  CBC - Abnormal; Notable for the following components:      Result Value   WBC 12.1 (*)    All other components within normal limits  DIFFERENTIAL - Abnormal; Notable for the following components:   Neutro Abs 10.3 (*)    All other components within normal limits  COMPREHENSIVE METABOLIC PANEL - Abnormal; Notable for the following components:   Glucose, Bld  101 (*)    AST 91 (*)    Total Bilirubin 1.3 (*)    All other components within normal limits  RESP PANEL BY RT-PCR (FLU A&B, COVID) ARPGX2  PROTIME-INR  APTT  HIV ANTIBODY (ROUTINE TESTING W REFLEX)  I-STAT CHEM 8, ED  CBG MONITORING, ED    EKG None  Radiology CT HEAD CODE STROKE WO CONTRAST  Result Date: 01/28/2021 CLINICAL DATA:  Right-sided weakness EXAM: CT ANGIOGRAPHY HEAD AND NECK CT PERFUSION BRAIN TECHNIQUE: Multidetector CT imaging of the head and neck was performed using the standard protocol during bolus administration of intravenous contrast. Multiplanar CT image reconstructions and MIPs were obtained to evaluate the vascular anatomy. Carotid stenosis measurements (when applicable) are obtained utilizing NASCET criteria, using the distal internal carotid diameter as the denominator. Multiphase CT imaging of the brain was performed following IV bolus contrast injection. Subsequent parametric perfusion maps were calculated using RAPID software. CONTRAST:  80 mL Omnipaque 350 COMPARISON:  Report of CTA from 2015 only. Images are unavailable at the time of dictation. FINDINGS: CT HEAD Brain: There is no acute intracranial hemorrhage or mass effect. There is hypoattenuation with loss of gray-white differentiation involving left insula and left frontoparietal lobes (pre and postcentral gyri). There is likely a chronic small left frontal cortical infarct involving the precentral gyrus. Chronic right occipitotemporal infarct. There are age-indeterminate but probably chronic small vessel infarcts of basal ganglia and central white matter bilaterally. Additional patchy and confluent areas of low-attenuation in the supratentorial white matter nonspecific but may reflect moderate chronic microvascular ischemic changes. Vascular: No definite hyperdense vessel. There may be focal hyperdensity of distal left M2 branch within the sylvian fissure, which would correspond to CTA findings. Skull: Calvarium  is unremarkable. Sinuses/Orbits: No acute finding. Other: None. Review of the MIP images confirms the above findings ASPECTS [facility] Stroke Program Early CT Score) - Ganglionic level infarction (caudate, lentiform nuclei, internal capsule, insula, M1-M3 cortex): 5 - Supraganglionic infarction (M4-M6 cortex): 2 Total score (0-10 with 10 being normal): 7 CTA NECK Aortic arch: Great vessel origins are patent with mild calcified plaque. Right carotid system: Patent. Calcified plaque at the bifurcation and along the proximal internal carotid with less than 50% stenosis. Retropharyngeal course of the ICA. Left carotid system: Patent. Mixed plaque at the bifurcation and proximal internal carotid without stenosis. Retropharyngeal course of the ICA. Vertebral arteries: Patent and codominant.  No stenosis. Skeleton: Mild degenerative changes of the cervical spine. Other neck: Diffusely enlarged but homogeneous appearing thyroid. No ultrasound follow-up is recommended by current guidelines. Upper chest: Mild centrilobular emphysema. Review  of the MIP images confirms the above findings CTA HEAD Anterior circulation: Intracranial internal carotid arteries are patent with calcified plaque along the cavernous and proximal supraclinoid portions. There is moderate to marked stenosis of the proximal left cavernous segment. Additional areas of moderate stenosis bilaterally. Left M1 MCA is patent. There is distal left M2 branch occlusion within the posterior sylvian fissure. Right middle cerebral artery is patent. Anterior cerebral arteries are patent. Anterior communicating artery is present. Posterior circulation: Intracranial vertebral arteries are patent. Basilar artery is patent. Major cerebellar artery origins are patent. Left posterior cerebral artery is patent. There is significantly decreased flow within the right PCA beginning at the P2 segment probably related to prior infarct. Venous sinuses: Patent as allowed by contrast  bolus timing. Review of the MIP images confirms the above findings CT Brain Perfusion Findings: CBF (<30%) Volume: 11mL Perfusion (Tmax>6.0s) volume: 39mL Mismatch Volume: 28mL Infarction Location: Left MCA territory IMPRESSION: No acute intracranial hemorrhage. Acute left MCA territory infarction with ASPECT score of 7. Distal left M2 MCA branch occlusion. Perfusion imaging demonstrates core infarction of 11 mL congruent with noncontrast CT findings. There is a calculated penumbra of 28 mL. Additional chronic findings detailed above including infarcts and microvascular ischemic changes. Plaque without hemodynamically significant stenosis at the ICA origins. Calcified plaque along the intracranial internal carotid arteries. Suspect moderate to marked stenosis of the proximal left cavernous segment. Additional areas of moderate stenosis bilaterally. Significantly decreased flow within the right PCA beginning at the P2 segment probably related to prior infarct. Emphysema. Mild enlargement of the main pulmonary artery, which could indicate pulmonary arterial hypertension. Initial results were communicated to Dr. Amada JupiterKirkpatrick at 12:27 pm on 01/28/2021 by text page via the [facility] messaging system. Electronically Signed   By: Guadlupe SpanishPraneil  Patel M.D.   On: 01/28/2021 12:44   CT ANGIO HEAD NECK W WO CM W PERF (CODE STROKE)  Result Date: 01/28/2021 CLINICAL DATA:  Right-sided weakness EXAM: CT ANGIOGRAPHY HEAD AND NECK CT PERFUSION BRAIN TECHNIQUE: Multidetector CT imaging of the head and neck was performed using the standard protocol during bolus administration of intravenous contrast. Multiplanar CT image reconstructions and MIPs were obtained to evaluate the vascular anatomy. Carotid stenosis measurements (when applicable) are obtained utilizing NASCET criteria, using the distal internal carotid diameter as the denominator. Multiphase CT imaging of the brain was performed following IV bolus contrast injection. Subsequent  parametric perfusion maps were calculated using RAPID software. CONTRAST:  80 mL Omnipaque 350 COMPARISON:  Report of CTA from 2015 only. Images are unavailable at the time of dictation. FINDINGS: CT HEAD Brain: There is no acute intracranial hemorrhage or mass effect. There is hypoattenuation with loss of gray-white differentiation involving left insula and left frontoparietal lobes (pre and postcentral gyri). There is likely a chronic small left frontal cortical infarct involving the precentral gyrus. Chronic right occipitotemporal infarct. There are age-indeterminate but probably chronic small vessel infarcts of basal ganglia and central white matter bilaterally. Additional patchy and confluent areas of low-attenuation in the supratentorial white matter nonspecific but may reflect moderate chronic microvascular ischemic changes. Vascular: No definite hyperdense vessel. There may be focal hyperdensity of distal left M2 branch within the sylvian fissure, which would correspond to CTA findings. Skull: Calvarium is unremarkable. Sinuses/Orbits: No acute finding. Other: None. Review of the MIP images confirms the above findings ASPECTS [facility]([place] Stroke Program Early CT Score) - Ganglionic level infarction (caudate, lentiform nuclei, internal capsule, insula, M1-M3 cortex): 5 - Supraganglionic infarction (M4-M6 cortex): 2 Total  score (0-10 with 10 being normal): 7 CTA NECK Aortic arch: Great vessel origins are patent with mild calcified plaque. Right carotid system: Patent. Calcified plaque at the bifurcation and along the proximal internal carotid with less than 50% stenosis. Retropharyngeal course of the ICA. Left carotid system: Patent. Mixed plaque at the bifurcation and proximal internal carotid without stenosis. Retropharyngeal course of the ICA. Vertebral arteries: Patent and codominant.  No stenosis. Skeleton: Mild degenerative changes of the cervical spine. Other neck: Diffusely enlarged but homogeneous  appearing thyroid. No ultrasound follow-up is recommended by current guidelines. Upper chest: Mild centrilobular emphysema. Review of the MIP images confirms the above findings CTA HEAD Anterior circulation: Intracranial internal carotid arteries are patent with calcified plaque along the cavernous and proximal supraclinoid portions. There is moderate to marked stenosis of the proximal left cavernous segment. Additional areas of moderate stenosis bilaterally. Left M1 MCA is patent. There is distal left M2 branch occlusion within the posterior sylvian fissure. Right middle cerebral artery is patent. Anterior cerebral arteries are patent. Anterior communicating artery is present. Posterior circulation: Intracranial vertebral arteries are patent. Basilar artery is patent. Major cerebellar artery origins are patent. Left posterior cerebral artery is patent. There is significantly decreased flow within the right PCA beginning at the P2 segment probably related to prior infarct. Venous sinuses: Patent as allowed by contrast bolus timing. Review of the MIP images confirms the above findings CT Brain Perfusion Findings: CBF (<30%) Volume: 90mL Perfusion (Tmax>6.0s) volume: 66mL Mismatch Volume: 45mL Infarction Location: Left MCA territory IMPRESSION: No acute intracranial hemorrhage. Acute left MCA territory infarction with ASPECT score of 7. Distal left M2 MCA branch occlusion. Perfusion imaging demonstrates core infarction of 11 mL congruent with noncontrast CT findings. There is a calculated penumbra of 28 mL. Additional chronic findings detailed above including infarcts and microvascular ischemic changes. Plaque without hemodynamically significant stenosis at the ICA origins. Calcified plaque along the intracranial internal carotid arteries. Suspect moderate to marked stenosis of the proximal left cavernous segment. Additional areas of moderate stenosis bilaterally. Significantly decreased flow within the right PCA  beginning at the P2 segment probably related to prior infarct. Emphysema. Mild enlargement of the main pulmonary artery, which could indicate pulmonary arterial hypertension. Initial results were communicated to Dr. Amada Jupiter at 12:27 pm on 01/28/2021 by text page via the [facility] messaging system. Electronically Signed   By: Guadlupe Spanish M.D.   On: 01/28/2021 12:44    Procedures Procedures   Medications Ordered in ED Medications   stroke: mapping our early stages of recovery book (has no administration in time range)  acetaminophen (TYLENOL) tablet 650 mg (has no administration in time range)    Or  acetaminophen (TYLENOL) 160 MG/5ML solution 650 mg (has no administration in time range)    Or  acetaminophen (TYLENOL) suppository 650 mg (has no administration in time range)  0.9 %  sodium chloride infusion (has no administration in time range)  ceFAZolin (ANCEF) 2-4 GM/100ML-% IVPB (has no administration in time range)  nitroGLYCERIN 100 mcg/mL intra-arterial injection (has no administration in time range)  fentaNYL in NS (58mcg/ml) infusion-PREMIX (50 mcg/hr Intravenous New Bag/Given 01/28/21 1544)  propofol (DIPRIVAN) 1000 MG/100ML infusion (50 mcg/kg/min  94 kg Intravenous Rate/Dose Change 01/28/21 1544)  sodium chloride flush (NS) 0.9 % injection 3 mL (3 mLs Intravenous Given 01/28/21 1238)  iohexol (OMNIPAQUE) 350 MG/ML injection 80 mL (80 mLs Intravenous Contrast Given 01/28/21 1235)  iohexol (OMNIPAQUE) 300 MG/ML solution 50 mL (10 mLs Intra-arterial  Contrast Given 01/28/21 1359)  nitroGLYCERIN 25 mcg in sodium chloride (PF) 0.9 % 59.71 mL (25 mcg/mL) syringe (25 mcg Intra-arterial Given 01/28/21 1410)  iohexol (OMNIPAQUE) 300 MG/ML solution 100 mL (50 mLs Intra-arterial Contrast Given 01/28/21 1400)    ED Course  I have reviewed the triage vital signs and the nursing notes.  Pertinent labs & imaging results that were available during my care of the patient were reviewed by me and  considered in my medical decision making (see chart for details).    MDM Rules/Calculators/A&P                         patient presents the emergency department as a code stroke, last known well last night. He has right hemiparesis as well as a dense expressive aphasia. He was evaluated by neurology on his ED presentation. CT head is negative for acute bleeding. Plan to transfer to interventional radiology for possible thrombotectomy.  Final Clinical Impression(s) / ED Diagnoses Final diagnoses:  None    Rx / DC Orders ED Discharge Orders     None        Tilden Fossa, MD 01/28/21 1600

## 2021-01-29 ENCOUNTER — Inpatient Hospital Stay (HOSPITAL_COMMUNITY): Payer: Medicare (Managed Care)

## 2021-01-29 ENCOUNTER — Encounter (HOSPITAL_COMMUNITY): Payer: Self-pay | Admitting: Interventional Radiology

## 2021-01-29 DIAGNOSIS — I6389 Other cerebral infarction: Secondary | ICD-10-CM | POA: Diagnosis not present

## 2021-01-29 DIAGNOSIS — I6602 Occlusion and stenosis of left middle cerebral artery: Secondary | ICD-10-CM

## 2021-01-29 DIAGNOSIS — J9601 Acute respiratory failure with hypoxia: Secondary | ICD-10-CM

## 2021-01-29 LAB — LIPID PANEL
Cholesterol: 172 mg/dL (ref 0–200)
HDL: 48 mg/dL (ref >40)
LDL Cholesterol: 94 mg/dL (ref 0–99)
Total CHOL/HDL Ratio: 3.6 RATIO
Triglycerides: 150 mg/dL — ABNORMAL HIGH (ref <150)
VLDL: 30 mg/dL (ref 0–40)

## 2021-01-29 LAB — POCT I-STAT 7, (LYTES, BLD GAS, ICA,H+H)
Acid-Base Excess: 2 mmol/L (ref 0.0–2.0)
Bicarbonate: 29 mmol/L — ABNORMAL HIGH (ref 20.0–28.0)
Calcium, Ion: 1.25 mmol/L (ref 1.15–1.40)
HCT: 47 % (ref 39.0–52.0)
Hemoglobin: 16 g/dL (ref 13.0–17.0)
O2 Saturation: 97 %
Potassium: 3.9 mmol/L (ref 3.5–5.1)
Sodium: 140 mmol/L (ref 135–145)
TCO2: 31 mmol/L (ref 22–32)
pCO2 arterial: 53.7 mmHg — ABNORMAL HIGH (ref 32.0–48.0)
pH, Arterial: 7.341 — ABNORMAL LOW (ref 7.350–7.450)
pO2, Arterial: 98 mmHg (ref 83.0–108.0)

## 2021-01-29 LAB — CBC WITH DIFFERENTIAL/PLATELET
Abs Immature Granulocytes: 0.03 10*3/uL (ref 0.00–0.07)
Basophils Absolute: 0 10*3/uL (ref 0.0–0.1)
Basophils Relative: 0 %
Eosinophils Absolute: 0 10*3/uL (ref 0.0–0.5)
Eosinophils Relative: 0 %
HCT: 43 % (ref 39.0–52.0)
Hemoglobin: 14.3 g/dL (ref 13.0–17.0)
Immature Granulocytes: 0 %
Lymphocytes Relative: 8 %
Lymphs Abs: 0.8 10*3/uL (ref 0.7–4.0)
MCH: 28.8 pg (ref 26.0–34.0)
MCHC: 33.3 g/dL (ref 30.0–36.0)
MCV: 86.7 fL (ref 80.0–100.0)
Monocytes Absolute: 0.5 10*3/uL (ref 0.1–1.0)
Monocytes Relative: 5 %
Neutro Abs: 8.5 10*3/uL — ABNORMAL HIGH (ref 1.7–7.7)
Neutrophils Relative %: 87 %
Platelets: 236 10*3/uL (ref 150–400)
RBC: 4.96 MIL/uL (ref 4.22–5.81)
RDW: 14.4 % (ref 11.5–15.5)
WBC: 9.8 10*3/uL (ref 4.0–10.5)
nRBC: 0 % (ref 0.0–0.2)

## 2021-01-29 LAB — ECHOCARDIOGRAM COMPLETE
AR max vel: 1.66 cm2
AV Area VTI: 1.59 cm2
AV Area mean vel: 1.4 cm2
AV Mean grad: 5 mmHg
AV Peak grad: 10.1 mmHg
Ao pk vel: 1.59 m/s
Area-P 1/2: 4.41 cm2
Height: 66 in
MV VTI: 1.85 cm2
S' Lateral: 3.6 cm
Weight: 3234.59 oz

## 2021-01-29 LAB — CK: Total CK: 3771 U/L — ABNORMAL HIGH (ref 49–397)

## 2021-01-29 LAB — HEMOGLOBIN A1C
Hgb A1c MFr Bld: 5.9 % — ABNORMAL HIGH (ref 4.8–5.6)
Mean Plasma Glucose: 122.63 mg/dL

## 2021-01-29 LAB — BASIC METABOLIC PANEL
Anion gap: 8 (ref 5–15)
BUN: 17 mg/dL (ref 8–23)
CO2: 26 mmol/L (ref 22–32)
Calcium: 8.8 mg/dL — ABNORMAL LOW (ref 8.9–10.3)
Chloride: 105 mmol/L (ref 98–111)
Creatinine, Ser: 1.24 mg/dL (ref 0.61–1.24)
GFR, Estimated: >60 mL/min (ref >60)
Glucose, Bld: 123 mg/dL — ABNORMAL HIGH (ref 70–99)
Potassium: 4.2 mmol/L (ref 3.5–5.1)
Sodium: 139 mmol/L (ref 135–145)

## 2021-01-29 LAB — TROPONIN I (HIGH SENSITIVITY): Troponin I (High Sensitivity): 66 ng/L — ABNORMAL HIGH (ref <18)

## 2021-01-29 MED ORDER — LACTATED RINGERS IV BOLUS
1000.0000 mL | Freq: Once | INTRAVENOUS | Status: AC
  Administered 2021-01-29: 1000 mL via INTRAVENOUS

## 2021-01-29 MED ORDER — LISINOPRIL 20 MG PO TABS
40.0000 mg | ORAL_TABLET | Freq: Every day | ORAL | Status: DC
  Administered 2021-01-29 – 2021-01-31 (×3): 40 mg via ORAL
  Filled 2021-01-29 (×3): qty 2

## 2021-01-29 MED ORDER — CLEVIDIPINE BUTYRATE 0.5 MG/ML IV EMUL
0.0000 mg/h | INTRAVENOUS | Status: DC
  Administered 2021-01-29: 6 mg/h via INTRAVENOUS
  Filled 2021-01-29: qty 50

## 2021-01-29 MED ORDER — SODIUM CHLORIDE 0.9 % IV SOLN
INTRAVENOUS | Status: DC | PRN
  Administered 2021-01-29: 250 mL via INTRAVENOUS

## 2021-01-29 MED ORDER — AMLODIPINE BESYLATE 5 MG PO TABS
5.0000 mg | ORAL_TABLET | Freq: Every day | ORAL | Status: DC
  Administered 2021-01-29 – 2021-01-31 (×3): 5 mg via ORAL
  Filled 2021-01-29 (×3): qty 1

## 2021-01-29 MED ORDER — PERFLUTREN LIPID MICROSPHERE
1.0000 mL | INTRAVENOUS | Status: AC | PRN
  Administered 2021-01-29: 3 mL via INTRAVENOUS
  Filled 2021-01-29: qty 10

## 2021-01-29 NOTE — Progress Notes (Addendum)
STROKE TEAM PROGRESS NOTE   SUBJECTIVE (INTERVAL HISTORY) Kenneth Yu has had no acute events since the last neurology visit. He was extubated during a.m. rounds and was breathing well.  He has expressive aphasia has severe dysarthria speaks barely a few words.  He has right hemiparesis with can move the right leg off the bed with only slight drift.  He also has leg hemianopsia.   blood pressure adequately controlled.  OBJECTIVE Vitals:   01/29/21 1200 01/29/21 1244 01/29/21 1300 01/29/21 1400  BP: (!) 155/81 138/87 129/79 135/76  Pulse: (!) 104 (!) 110 91 99  Resp: 16 (!) 23 20 19   Temp: (!) 100.4 F (38 C)     TempSrc: Axillary     SpO2: 93% 92% 93% 94%  Weight:      Height:        CBC:  Recent Labs  Lab 01/28/21 1210 01/28/21 1218 01/29/21 0017  WBC 12.1*  --  9.8  NEUTROABS 10.3*  --  8.5*  HGB 15.3 16.7 14.3  HCT 44.5 49.0 43.0  MCV 85.4  --  86.7  PLT 249  --  236    Basic Metabolic Panel:  Recent Labs  Lab 01/28/21 1210 01/28/21 1218 01/29/21 0017  NA 142 143 139  K 3.9 3.8 4.2  CL 105 105 105  CO2 26  --  26  GLUCOSE 101* 96 123*  BUN 13 13 17   CREATININE 1.13 1.00 1.24  CALCIUM 9.1  --  8.8*    Lipid Panel:  Recent Labs  Lab 01/29/21 0017  CHOL 172  TRIG 150*  HDL 48  CHOLHDL 3.6  VLDL 30  LDLCALC 94   HgbA1c:  Lab Results  Component Value Date   HGBA1C 5.9 (H) 01/29/2021   Urine Drug Screen: No results found for: LABOPIA, COCAINSCRNUR, LABBENZ, AMPHETMU, THCU, LABBARB  Alcohol Level No results found for: [facility]  IMAGING  Results for orders placed or performed during the hospital encounter of 01/28/21  MR BRAIN WO CONTRAST   Narrative   CLINICAL DATA:  Right-sided weakness.  History of stroke  EXAM: MRI HEAD WITHOUT CONTRAST  TECHNIQUE: Multiplanar, multiecho pulse sequences of the brain and surrounding structures were obtained without intravenous contrast.  COMPARISON:  CT and CTA from yesterday  FINDINGS: Brain: Large area  of cortically based infarction in the lateral left frontal lobe, MCA branch distribution. Minimal patchy left caudate acute infarcts. Petechial hemorrhage is present.  Remote cortically based infarct affecting the right occipital lobe. Chronic lacunar infarcts in the bilateral deep gray nuclei. Chronic small right cerebellar infarct. Confluent chronic small vessel ischemic gliosis in the deep white matter. Brain volume is normal.  Chronic micro hemorrhages which are mainly peripheral but in areas that were likely affected by prior infarction.  No hydrocephalus, collection, or masslike finding.  Vascular: Preserved flow voids.  Skull and upper cervical spine: Normal marrow signal  Sinuses/Orbits: Nasopharyngeal and sinus opacification in the setting of intubation. Bilateral cataract resection.  IMPRESSION: 1. Large acute left MCA branch infarct with petechial hemorrhage. Minimal involvement in the left caudate head. 2. Chronic small vessel disease with chronic lacunar infarcts. Prior right PCA territory infarct.   Electronically Signed   By: [facility] M.D.   On: 01/29/2021 05:29     PHYSICAL EXAM  General exam Pleasant middle-aged African-American male HEENT-  Normocephalic, no lesions, without obvious abnormality.  Normal external eye and conjunctiva.   Cardiovascular- regular rhythm, on tele, tachycardic   Lungs-clear to  auscultation.  Abdomen- soft Musculoskeletal-no joint deformity; deep scarring on the medial aspect of the left mid-leg. Skin-warm and dry, well-perfused; redness on the left knee.  Neurologic Exam:  Mental Status: Alert, Able to follow commands.  Expressive aphasia speaks only occasional words and severe dysarthria.  Good comprehension and follows most commands. Cranial Nerves: II:  No blink to threat on the right, no gaze preference.  III,IV, VI: ptosis not present, extra-ocular motions intact bilaterally V,VII: Face symmetric but difficult  to assess 2/2 tube VIII: hearing normal bilaterally IX,X: Unable to assess XI: Unable to assess XII: midline tongue extension  Motor: Right : Upper extremity   2/5    Left:     Upper extremity   3/5  Lower extremity   1/5     Lower extremity   3/5 Cerebellar: Unable to assess Gait: Deferred   ASSESSMENT/PLAN Kenneth Yu is a 65 y.o. male with a past medical history significant for stroke, atrial fibrillation (INR 1.1), DVT, HTN, myocardial infarction, HLD who presented to [facility] after being found by his ex-wife with right-sided weakness and aphasia. On arrival, imaging showed a distal left M2 MCA branch occlusion with a large area of penumbra and the CT perfusion is favorable for intervention and was taken for emergent thrombectomy. More recent MR this morning showed a large acute left MCA branch infarct with petechial hemorrhage.  # Stroke: Left MCA branch infarct-due to left M2 occlusion  t s/p emergent thrombectomy Repeat MRI head in the morning 2D Echo completed, no significant findings. LDL 94; Add Lipitor 80mg  when cleared for PO intake HgbA1c 5.9 Continue 300mg  aspirin suppository until cleared for PO intake Ongoing aggressive stroke risk factor management PT/OT/SLP consults appreciated  Hypertension 120s-150s/70s-90s Continue lisinopril, cleviprex Permissive hypertension (OK if < 220/120) but gradually normalize in 5-7 days Long-term BP goal normotensive  Hyperlipidemia LDL 94, goal < 70 Add Lipitor 80mg  daily when cleared for PO intake Continue statin at discharge  Hospital day # 1  , PhD, PA-C Stroke [phone]  I have personally obtained history,examined this patient, reviewed notes, independently viewed imaging studies, participated in medical decision making and plan of care.ROS completed by me personally and pertinent positives fully documented  I have made any additions or clarifications directly to the above note. Agree with note above.   Patient presented with expressive aphasia right hemiplegia due to left M2 occlusion and underwent successful mechanical thrombectomy with remains with significant aphasia and right hemiparesis and right hemifield loss.  He has a history of A. fib but apparently has not been taking his anticoagulation.  Continue close neurological follow-up and strict blood pressure control as per post intervention protocol.  Speech therapy to check swallow eval.  Repeat CT scan of the head tomorrow morning and will start anticoagulation if there is no hemorrhage.  Check echocardiogram.  No family available at the bedside for discussion.This patient is critically ill and at significant risk of neurological worsening, death and care requires constant monitoring of vital signs, hemodynamics,respiratory and cardiac monitoring, extensive review of multiple databases, frequent neurological assessment, discussion with family, other specialists and medical decision making of high complexity.I have made any additions or clarifications directly to the above note.This critical care time does not reflect procedure time, or teaching time or supervisory time of PA/NP/Med Resident etc but could involve care discussion time.  I spent 30 minutes of neurocritical care time  in the care of  this patient.      ,  MD Medical Director [facility] Pager: [phone] 01/29/2021 4:21 PM  To contact Stroke Continuity provider, please refer to [email or web address]. After hours, contact General Neurology

## 2021-01-29 NOTE — Progress Notes (Signed)
NAME:  Kenneth Yu, MRN:  [id number], DOB:  July 02, 1956, LOS: 1 ADMISSION DATE:  01/28/2021, CONSULTATION DATE: 01/28/2021 REFERRING MD:  Julieanne Cotton, MD , CHIEF COMPLAINT: Right-sided weakness  History of Present Illness:  Patient is currently sedated and intubated so most of the history is obtained from chart review 65 year old male with history of hypertension and hyperlipidemia who was brought in the emergency department with complaint of right-sided weakness and aphasia. Stroke code was called, patient had CT head which was negative for intracranial hemorrhage, positive for left MCA stroke, CTA head and neck showed M2 occlusion, patient did not receive tPA because he was out of window, he underwent thrombectomy with TICI 3 results, postprocedure patient remained on ventilator, PCCM was consulted for help with evaluation and management  Pertinent  Medical History   Past Medical History:  Diagnosis Date   DVT (deep venous thrombosis) (HCC)    Hyperlipidemia    Hypertension    Myocardial infarction (HCC)      Significant Hospital Events: Including procedures, antibiotic start and stop dates in addition to other pertinent events   Underwent mechanical thrombectomy  Interim History / Subjective:  Patient is tolerating pressure support trial, he is awake and following commands Remained afebrile MRI brain was done  Objective   Blood pressure (!) 143/82, pulse 98, temperature 100.3 F (37.9 C), temperature source Axillary, resp. rate (!) 23, height 5\' 6"  (1.676 m), weight 91.7 kg, SpO2 98 %.    Vent Mode: CPAP;PSV FiO2 (%):  [40 %-100 %] 40 % Set Rate:  [15 bmp-18 bmp] 15 bmp Vt Set:  [530 mL-560 mL] 530 mL PEEP:  [5 cmH20] 5 cmH20 Pressure Support:  [8 cmH20] 8 cmH20 Plateau Pressure:  [16 cmH20-17 cmH20] 17 cmH20   Intake/Output Summary (Last 24 hours) at 01/29/2021 04/01/2021 Last data filed at 01/29/2021 0800 Gross per 24 hour  Intake 1226.23 ml  Output 465 ml  Net 761.23  ml   Filed Weights   01/28/21 1200 01/28/21 1700  Weight: 94 kg 91.7 kg    Examination: Physical exam: General: Acutely ill-appearing male, orally intubated HEENT: [place]/AT, eyes anicteric.  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 Acute respiratory insufficiency postprocedure Hypertension Hyperlipidemia History of DVT on anticoagulation  Continue neuro watch every hour Management per stroke team Continue secondary stroke prophylaxis Continue lung protective ventilation Patient is tolerating pressure support trial, will try to see if he can be extubated Avoid sedation with RASS goal 0 VAP bundle Closely monitor blood pressure, SBP goal 120-140 Holding home antihypertensive meds for now Patient was on Xarelto, which will be kept on hold for now, will resume once stable  Best Practice (right click and "Reselect all SmartList Selections" daily)   Diet/type: NPO.  Swallow evaluation after extubation DVT prophylaxis: DOAC GI prophylaxis: H2B Lines: N/A Foley:  N/A Code Status:  full code Last date of multidisciplinary goals of care discussion [Per primary team]  Labs   CBC: Recent Labs  Lab 01/28/21 1210 01/28/21 1218 01/29/21 0017  WBC 12.1*  --  9.8  NEUTROABS 10.3*  --  8.5*  HGB 15.3 16.7 14.3  HCT 44.5 49.0 43.0  MCV 85.4  --  86.7  PLT 249  --  236    Basic Metabolic Panel: Recent Labs  Lab 01/28/21 1210 01/28/21 1218 01/29/21 0017  NA 142 143 139  K 3.9 3.8 4.2  CL 105 105 105  CO2 26  --  26  GLUCOSE 101* 96 123*  BUN 13 13 17   CREATININE 1.13 1.00  1.24  CALCIUM 9.1  --  8.8*   GFR: Estimated Creatinine Clearance: 63.8 mL/min (by C-G formula based on SCr of 1.24 mg/dL). Recent Labs  Lab 01/28/21 1210 01/29/21 0017  WBC 12.1* 9.8    Liver Function Tests: Recent Labs  Lab 01/28/21 1210  AST 91*  ALT 40  ALKPHOS 46  BILITOT 1.3*  PROT 6.5  ALBUMIN 3.7   No results for input(s): LIPASE, AMYLASE in the last 168 hours. No results for input(s): AMMONIA in the last 168 hours.  ABG    Component Value Date/Time   TCO2 25 01/28/2021 1218     Coagulation Profile: Recent Labs  Lab 01/28/21 1210  INR 1.1    Cardiac Enzymes: Recent Labs  Lab 01/28/21 1805  CKTOTAL 5,752*    HbA1C: Hgb A1c MFr Bld  Date/Time Value Ref Range Status  01/29/2021 12:17 AM 5.9 (H) 4.8 - 5.6 % Final    Comment:    REPEATED TO VERIFY (NOTE) Pre diabetes:          5.7%-6.4%  Diabetes:              >6.4%  Glycemic control for   <7.0% adults with diabetes     CBG: Recent Labs  Lab 01/28/21 1208  GLUCAP 94     Total critical care time: 32 minutes  Performed by: 03/31/21   Critical care time was exclusive of separately billable procedures and treating other patients.   Critical care was necessary to treat or prevent imminent or life-threatening deterioration.   Critical care was time spent personally by me on the following activities: development of treatment plan with patient and/or surrogate as well as nursing, discussions with consultants, evaluation of patient's response to treatment, examination of patient, obtaining history from patient or surrogate, ordering and performing treatments and interventions, ordering and review of laboratory studies, ordering and review of radiographic studies, pulse oximetry and re-evaluation of patient's condition.   Cheri Fowler MD [facility] See Amion for pager If no response to pager, please call [phone] until 7pm After 7pm, Please call E-link  [phone]

## 2021-01-29 NOTE — Evaluation (Addendum)
Physical Therapy Evaluation Patient Details Name: Kenneth Yu MRN: [id number] DOB: [birth year] Today's Date: 01/29/2021   History of Present Illness  65 yo male s/p R side weakness, aphasia and ?R hemianopsia.  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 PMHx: HTN, HLD.  Clinical Impression   Pt presents with R hemiparesis, R inattention but demonstrates ability to cross midline with eyes with mod cuing, impaired balance, impaired communication, difficulty performing mobility tasks, and decreased activity tolerance. Pt to benefit from acute PT to address deficits. Pt requiring mod +2 for bed mobility and transfer to standing, pt tolerating x2 small steps laterally. Pt was independent PTA per pt report, occasional use of cane only. Recommending CIR consult. PT to progress mobility as tolerated, and will continue to follow acutely.   BP sitting: 142/89 (103), 85-93% O2 on 3L, 103 BPM; supine after exertion: 138/87, 117 BPM.    Follow Up Recommendations CIR    Equipment Recommendations  Other (comment) (TBD next venue)    Recommendations for Other Services       Precautions / Restrictions Precautions Precautions: Fall Restrictions Weight Bearing Restrictions: No      Mobility  Bed Mobility Overal bed mobility: Needs Assistance Bed Mobility: Supine to Sit;Sit to Supine     Supine to sit: Mod assist;+2 for physical assistance Sit to supine: Mod assist;+2 for physical assistance   General bed mobility comments: mod +2 for trunk and LE management, scooting to/from EOB, and boost up in bed upon return to supine. Increased assist for RLE/RUE, pt choosing to exit bed towards L.    Transfers Overall transfer level: Needs assistance Equipment used: 2 person hand held assist Transfers: Sit to/from Stand Sit to Stand: Mod assist;+2 physical assistance         General transfer comment: Mod +2 for rise, steady, and  correcting posture to upright midline. Pt with preference for R lateral leaning. Pt took x2 steps bilat towards HOB, very untrusting of RLE and limited step length LLE due to this.  Ambulation/Gait             General Gait Details: nt  Information systems manager Rankin (Stroke Patients Only) Modified Rankin (Stroke Patients Only) Pre-Morbid Rankin Score: No symptoms Modified Rankin: Moderately severe disability     Balance Overall balance assessment: Needs assistance Sitting-balance support: Single extremity supported;Feet supported Sitting balance-Leahy Scale: Fair Sitting balance - Comments: able to sit statically EOB without external assist, posterior and R lateral leaning with dynamic activity/fatigue Postural control: Posterior lean;Right lateral lean Standing balance support: During functional activity;Bilateral upper extremity supported Standing balance-Leahy Scale: Poor Standing balance comment: reliant on external support                             Pertinent Vitals/Pain Pain Assessment: Faces Faces Pain Scale: No hurt Pain Intervention(s): Limited activity within patient's tolerance;Monitored during session;Repositioned    Home Living Family/patient expects to be discharged to:: Private residence Living Arrangements: Alone Available Help at Discharge: Family;Friend(s);Available PRN/intermittently Type of Home: Apartment Home Access: Stairs to enter   Entrance Stairs-Number of Steps: 10 Home Layout: One level Home Equipment: Walker - 2 wheels;Cane - single point Additional Comments: unsure of accuracy as pt was nonverbal but gesturing; attemtping to mouth words    Prior Function Level of Independence: Independent with  assistive device(s);Needs assistance   Gait / Transfers Assistance Needed: cane with mobility; requires transportation  ADL's / Homemaking Assistance Needed: independent; cooking; grocery shopping;  medication management        Hand Dominance   Dominant Hand: Right    Extremity/Trunk Assessment   Upper Extremity Assessment Upper Extremity Assessment: Defer to OT evaluation    Lower Extremity Assessment Lower Extremity Assessment: Generalized weakness;RLE deficits/detail;Difficult to assess due to impaired cognition RLE Deficits / Details: able to perform ~50% ROM heel slide in supine, formal MMT not conducted given pt command following deficit RLE Sensation: decreased light touch    Cervical / Trunk Assessment Cervical / Trunk Assessment: Normal  Communication   Communication: Expressive difficulties  Cognition Arousal/Alertness: Awake/alert Behavior During Therapy: WFL for tasks assessed/performed;Flat affect Overall Cognitive Status: Difficult to assess Area of Impairment: Attention;Following commands;Safety/judgement;Problem solving                   Current Attention Level: Sustained   Following Commands: Follows one step commands inconsistently;Follows one step commands with increased time Safety/Judgement: Decreased awareness of deficits;Decreased awareness of safety   Problem Solving: Slow processing;Decreased initiation;Difficulty sequencing;Requires verbal cues;Requires tactile cues General Comments: Pt perseverative on certain things during session (motioning desire to eat, counting stairs to reach apt with fingers), requires cues to focus elsewhere. Pt oriented to self, inconsistently correctly follows one-step commands and at times needs multimodal cuing.      General Comments General comments (skin integrity, edema, etc.): BP sitting: 142/89 (103), 85-93% O2 on 3L, 103 BPM; supine after exertion: 138/87, 117 BPM.    Exercises     Assessment/Plan    PT Assessment Patient needs continued PT services  PT Problem List Decreased strength;Decreased mobility;Decreased safety awareness;Decreased activity tolerance;Decreased balance;Decreased knowledge  of use of DME;Cardiopulmonary status limiting activity;Decreased coordination;Decreased cognition       PT Treatment Interventions DME instruction;Therapeutic activities;Gait training;Patient/family education;Balance training;Functional mobility training;Neuromuscular re-education;Therapeutic exercise    PT Goals (Current goals can be found in the Care Plan section)  Acute Rehab PT Goals Patient Stated Goal: none stated PT Goal Formulation: With patient Time For Goal Achievement: 02/12/21 Potential to Achieve Goals: Good    Frequency Min 4X/week   Barriers to discharge        Co-evaluation PT/OT/SLP Co-Evaluation/Treatment: Yes Reason for Co-Treatment: For patient/therapist safety;To address functional/ADL transfers;Complexity of the patient's impairments (multi-system involvement) PT goals addressed during session: Mobility/safety with mobility;Balance         AM-PAC PT "6 Clicks" Mobility  Outcome Measure Help needed turning from your back to your side while in a flat bed without using bedrails?: A Lot Help needed moving from lying on your back to sitting on the side of a flat bed without using bedrails?: A Lot Help needed moving to and from a bed to a chair (including a wheelchair)?: A Lot Help needed standing up from a chair using your arms (e.g., wheelchair or bedside chair)?: A Lot Help needed to walk in hospital room?: Total Help needed climbing 3-5 steps with a railing? : Total 6 Click Score: 10    End of Session   Activity Tolerance: Patient tolerated treatment well;Patient limited by fatigue Patient left: in bed;with call bell/phone within reach;with bed alarm set;with SCD's reapplied Nurse Communication: Mobility status PT Visit Diagnosis: Other abnormalities of gait and mobility (R26.89);Muscle weakness (generalized) (M62.81);Hemiplegia and hemiparesis Hemiplegia - Right/Left: Right Hemiplegia - dominant/non-dominant: Dominant Hemiplegia - caused by: Cerebral  infarction    Time: 1505-6979  PT Time Calculation (min) (ACUTE ONLY): 28 min   Charges:   PT Evaluation $PT Eval Low Complexity: 1 Low        Stephana Morell S, PT DPT Acute Rehabilitation Services Pager [phone]  Office [phone]   Tyrone Apple E Christain [place] 01/29/2021, 3:31 PM

## 2021-01-29 NOTE — Progress Notes (Signed)
Referring Physician(s): Kirkpatrick,M  Supervising Physician: Julieanne Cotton  Patient Status:  [facility] - In-pt  Chief Complaint: Right sided weakness   Subjective: Patient remains intubated; does follow few commands; temp 100.3; responds to pain; right-sided weakness remains   Allergies: Patient has no known allergies.  Medications: Prior to Admission medications   Medication Sig Start Date End Date Taking? Authorizing Provider  acetaminophen (TYLENOL) 325 MG tablet Take 650 mg by mouth every 4 (four) hours as needed.    [provider]  aspirin 81 MG tablet Take 81 mg by mouth daily.    [provider]  atorvastatin (LIPITOR) 80 MG tablet Take 80 mg by mouth daily.    [provider]  bisacodyl (DULCOLAX) 10 MG suppository Place 10 mg rectally daily as needed for moderate constipation.    [provider]  docusate sodium (COLACE) 100 MG capsule Take 100 mg by mouth 2 (two) times daily.    [provider]  lisinopril (PRINIVIL,ZESTRIL) 10 MG tablet Take 10 mg by mouth daily.    [provider]  magnesium hydroxide (MILK OF MAGNESIA) 800 MG/5ML suspension Take 30 mLs by mouth daily as needed for constipation.    [provider]  rivaroxaban (XARELTO) 20 MG TABS tablet Take 20 mg by mouth daily with supper.    [provider]     Vital Signs: BP (!) 143/82   Pulse 98   Temp 100.3 F (37.9 C) (Axillary)   Resp (!) 23   Ht 5\' 6"  (1.676 m)   Wt 202 lb 2.6 oz (91.7 kg)   SpO2 98%   BMI 32.63 kg/m   Physical Exam patient awake, intubated, follows few commands, pupils equal round reactive to light, nonpurposeful movement right upper extremity and right lower extremity; able to move left upper and lower extremity ; right groin access site clean, dry, no hematoma, intact distal pulses  Imaging: MR BRAIN WO CONTRAST  Result Date: 01/29/2021 CLINICAL DATA:  Right-sided weakness.  History of stroke EXAM:  MRI HEAD WITHOUT CONTRAST TECHNIQUE: Multiplanar, multiecho pulse sequences of the brain and surrounding structures were obtained without intravenous contrast. COMPARISON:  CT and CTA from yesterday FINDINGS: Brain: Large area of cortically based infarction in the lateral left frontal lobe, MCA branch distribution. Minimal patchy left caudate acute infarcts. Petechial hemorrhage is present. Remote cortically based infarct affecting the right occipital lobe. Chronic lacunar infarcts in the bilateral deep gray nuclei. Chronic small right cerebellar infarct. Confluent chronic small vessel ischemic gliosis in the deep white matter. Brain volume is normal. Chronic micro hemorrhages which are mainly peripheral but in areas that were likely affected by prior infarction. No hydrocephalus, collection, or masslike finding. Vascular: Preserved flow voids. Skull and upper cervical spine: Normal marrow signal Sinuses/Orbits: Nasopharyngeal and sinus opacification in the setting of intubation. Bilateral cataract resection. IMPRESSION: 1. Large acute left MCA branch infarct with petechial hemorrhage. Minimal involvement in the left caudate head. 2. Chronic small vessel disease with chronic lacunar infarcts. Prior right PCA territory infarct. Electronically Signed   By: 04/01/2021 M.D.   On: 01/29/2021 05:29   DG CHEST PORT 1 VIEW  Result Date: 01/29/2021 CLINICAL DATA:  Endotracheal tube placement EXAM: PORTABLE CHEST 1 VIEW COMPARISON:  08/07/2018 FINDINGS: Endotracheal tube placed with tip measuring 3.6 cm above the carina. Enteric tube placed with tip not visualized off the field of view but below the left hemidiaphragm. Cardiac enlargement. Small left pleural effusion. Atelectasis or infiltration in both  lung bases. No pneumothorax. Calcification of the aorta. IMPRESSION: Appliances appear in satisfactory position. Probable small left pleural effusion with atelectasis or infiltration in both lung bases. Electronically  Signed   By: Burman Nieves M.D.   On: 01/29/2021 02:49   DG Abd Portable 1V  Result Date: 01/28/2021 CLINICAL DATA:  OG tube placement. EXAM: PORTABLE ABDOMEN - 1 VIEW COMPARISON:  None. FINDINGS: Tip and side port of the enteric tube below the diaphragm in the stomach. Air throughout nondilated small and large bowel in the upper abdomen. IMPRESSION: Tip and side port of the enteric tube below the diaphragm in the stomach. Electronically Signed   By: Narda Rutherford M.D.   On: 01/28/2021 18:02   CT HEAD CODE STROKE WO CONTRAST  Result Date: 01/28/2021 CLINICAL DATA:  Right-sided weakness EXAM: CT ANGIOGRAPHY HEAD AND NECK CT PERFUSION BRAIN TECHNIQUE: Multidetector CT imaging of the head and neck was performed using the standard protocol during bolus administration of intravenous contrast. Multiplanar CT image reconstructions and MIPs were obtained to evaluate the vascular anatomy. Carotid stenosis measurements (when applicable) are obtained utilizing NASCET criteria, using the distal internal carotid diameter as the denominator. Multiphase CT imaging of the brain was performed following IV bolus contrast injection. Subsequent parametric perfusion maps were calculated using RAPID software. CONTRAST:  80 mL Omnipaque 350 COMPARISON:  Report of CTA from 2015 only. Images are unavailable at the time of dictation. FINDINGS: CT HEAD Brain: There is no acute intracranial hemorrhage or mass effect. There is hypoattenuation with loss of gray-white differentiation involving left insula and left frontoparietal lobes (pre and postcentral gyri). There is likely a chronic small left frontal cortical infarct involving the precentral gyrus. Chronic right occipitotemporal infarct. There are age-indeterminate but probably chronic small vessel infarcts of basal ganglia and central white matter bilaterally. Additional patchy and confluent areas of low-attenuation in the supratentorial white matter nonspecific but may reflect  moderate chronic microvascular ischemic changes. Vascular: No definite hyperdense vessel. There may be focal hyperdensity of distal left M2 branch within the sylvian fissure, which would correspond to CTA findings. Skull: Calvarium is unremarkable. Sinuses/Orbits: No acute finding. Other: None. Review of the MIP images confirms the above findings ASPECTS [facility] Stroke Program Early CT Score) - Ganglionic level infarction (caudate, lentiform nuclei, internal capsule, insula, M1-M3 cortex): 5 - Supraganglionic infarction (M4-M6 cortex): 2 Total score (0-10 with 10 being normal): 7 CTA NECK Aortic arch: Great vessel origins are patent with mild calcified plaque. Right carotid system: Patent. Calcified plaque at the bifurcation and along the proximal internal carotid with less than 50% stenosis. Retropharyngeal course of the ICA. Left carotid system: Patent. Mixed plaque at the bifurcation and proximal internal carotid without stenosis. Retropharyngeal course of the ICA. Vertebral arteries: Patent and codominant.  No stenosis. Skeleton: Mild degenerative changes of the cervical spine. Other neck: Diffusely enlarged but homogeneous appearing thyroid. No ultrasound follow-up is recommended by current guidelines. Upper chest: Mild centrilobular emphysema. Review of the MIP images confirms the above findings CTA HEAD Anterior circulation: Intracranial internal carotid arteries are patent with calcified plaque along the cavernous and proximal supraclinoid portions. There is moderate to marked stenosis of the proximal left cavernous segment. Additional areas of moderate stenosis bilaterally. Left M1 MCA is patent. There is distal left M2 branch occlusion within the posterior sylvian fissure. Right middle cerebral artery is patent. Anterior cerebral arteries are patent. Anterior communicating artery is present. Posterior circulation: Intracranial vertebral arteries are patent. Basilar artery is patent. Major cerebellar  artery origins are patent. Left posterior cerebral artery is patent. There is significantly decreased flow within the right PCA beginning at the P2 segment probably related to prior infarct. Venous sinuses: Patent as allowed by contrast bolus timing. Review of the MIP images confirms the above findings CT Brain Perfusion Findings: CBF (<30%) Volume: 58mL Perfusion (Tmax>6.0s) volume: 35mL Mismatch Volume: 37mL Infarction Location: Left MCA territory IMPRESSION: No acute intracranial hemorrhage. Acute left MCA territory infarction with ASPECT score of 7. Distal left M2 MCA branch occlusion. Perfusion imaging demonstrates core infarction of 11 mL congruent with noncontrast CT findings. There is a calculated penumbra of 28 mL. Additional chronic findings detailed above including infarcts and microvascular ischemic changes. Plaque without hemodynamically significant stenosis at the ICA origins. Calcified plaque along the intracranial internal carotid arteries. Suspect moderate to marked stenosis of the proximal left cavernous segment. Additional areas of moderate stenosis bilaterally. Significantly decreased flow within the right PCA beginning at the P2 segment probably related to prior infarct. Emphysema. Mild enlargement of the main pulmonary artery, which could indicate pulmonary arterial hypertension. Initial results were communicated to Dr. Amada Jupiter at 12:27 pm on 01/28/2021 by text page via the [facility] messaging system. Electronically Signed   By: Guadlupe Spanish M.D.   On: 01/28/2021 12:44   CT ANGIO HEAD NECK W WO CM W PERF (CODE STROKE)  Result Date: 01/28/2021 CLINICAL DATA:  Right-sided weakness EXAM: CT ANGIOGRAPHY HEAD AND NECK CT PERFUSION BRAIN TECHNIQUE: Multidetector CT imaging of the head and neck was performed using the standard protocol during bolus administration of intravenous contrast. Multiplanar CT image reconstructions and MIPs were obtained to evaluate the vascular anatomy. Carotid stenosis  measurements (when applicable) are obtained utilizing NASCET criteria, using the distal internal carotid diameter as the denominator. Multiphase CT imaging of the brain was performed following IV bolus contrast injection. Subsequent parametric perfusion maps were calculated using RAPID software. CONTRAST:  80 mL Omnipaque 350 COMPARISON:  Report of CTA from 2015 only. Images are unavailable at the time of dictation. FINDINGS: CT HEAD Brain: There is no acute intracranial hemorrhage or mass effect. There is hypoattenuation with loss of gray-white differentiation involving left insula and left frontoparietal lobes (pre and postcentral gyri). There is likely a chronic small left frontal cortical infarct involving the precentral gyrus. Chronic right occipitotemporal infarct. There are age-indeterminate but probably chronic small vessel infarcts of basal ganglia and central white matter bilaterally. Additional patchy and confluent areas of low-attenuation in the supratentorial white matter nonspecific but may reflect moderate chronic microvascular ischemic changes. Vascular: No definite hyperdense vessel. There may be focal hyperdensity of distal left M2 branch within the sylvian fissure, which would correspond to CTA findings. Skull: Calvarium is unremarkable. Sinuses/Orbits: No acute finding. Other: None. Review of the MIP images confirms the above findings ASPECTS [facility] Stroke Program Early CT Score) - Ganglionic level infarction (caudate, lentiform nuclei, internal capsule, insula, M1-M3 cortex): 5 - Supraganglionic infarction (M4-M6 cortex): 2 Total score (0-10 with 10 being normal): 7 CTA NECK Aortic arch: Great vessel origins are patent with mild calcified plaque. Right carotid system: Patent. Calcified plaque at the bifurcation and along the proximal internal carotid with less than 50% stenosis. Retropharyngeal course of the ICA. Left carotid system: Patent. Mixed plaque at the bifurcation and proximal  internal carotid without stenosis. Retropharyngeal course of the ICA. Vertebral arteries: Patent and codominant.  No stenosis. Skeleton: Mild degenerative changes of the cervical spine. Other neck: Diffusely enlarged but homogeneous appearing thyroid. No ultrasound follow-up  is recommended by current guidelines. Upper chest: Mild centrilobular emphysema. Review of the MIP images confirms the above findings CTA HEAD Anterior circulation: Intracranial internal carotid arteries are patent with calcified plaque along the cavernous and proximal supraclinoid portions. There is moderate to marked stenosis of the proximal left cavernous segment. Additional areas of moderate stenosis bilaterally. Left M1 MCA is patent. There is distal left M2 branch occlusion within the posterior sylvian fissure. Right middle cerebral artery is patent. Anterior cerebral arteries are patent. Anterior communicating artery is present. Posterior circulation: Intracranial vertebral arteries are patent. Basilar artery is patent. Major cerebellar artery origins are patent. Left posterior cerebral artery is patent. There is significantly decreased flow within the right PCA beginning at the P2 segment probably related to prior infarct. Venous sinuses: Patent as allowed by contrast bolus timing. Review of the MIP images confirms the above findings CT Brain Perfusion Findings: CBF (<30%) Volume: 11mL Perfusion (Tmax>6.0s) volume: 39mL Mismatch Volume: 28mL Infarction Location: Left MCA territory IMPRESSION: No acute intracranial hemorrhage. Acute left MCA territory infarction with ASPECT score of 7. Distal left M2 MCA branch occlusion. Perfusion imaging demonstrates core infarction of 11 mL congruent with noncontrast CT findings. There is a calculated penumbra of 28 mL. Additional chronic findings detailed above including infarcts and microvascular ischemic changes. Plaque without hemodynamically significant stenosis at the ICA origins. Calcified  plaque along the intracranial internal carotid arteries. Suspect moderate to marked stenosis of the proximal left cavernous segment. Additional areas of moderate stenosis bilaterally. Significantly decreased flow within the right PCA beginning at the P2 segment probably related to prior infarct. Emphysema. Mild enlargement of the main pulmonary artery, which could indicate pulmonary arterial hypertension. Initial results were communicated to Dr. Amada JupiterKirkpatrick at 12:27 pm on 01/28/2021 by text page via the [facility] messaging system. Electronically Signed   By: Guadlupe SpanishPraneil  Patel M.D.   On: 01/28/2021 12:44    Labs:  CBC: Recent Labs    01/28/21 1210 01/28/21 1218 01/29/21 0017  WBC 12.1*  --  9.8  HGB 15.3 16.7 14.3  HCT 44.5 49.0 43.0  PLT 249  --  236    COAGS: Recent Labs    01/28/21 1210  INR 1.1  APTT 24    BMP: Recent Labs    01/28/21 1210 01/28/21 1218 01/29/21 0017  NA 142 143 139  K 3.9 3.8 4.2  CL 105 105 105  CO2 26  --  26  GLUCOSE 101* 96 123*  BUN 13 13 17   CALCIUM 9.1  --  8.8*  CREATININE 1.13 1.00 1.24  GFRNONAA >60  --  >60    LIVER FUNCTION TESTS: Recent Labs    01/28/21 1210  BILITOT 1.3*  AST 91*  ALT 40  ALKPHOS 46  PROT 6.5  ALBUMIN 3.7    Assessment and Plan: 65 year old male with 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  Extubation later today; additional plans as per stroke service   Electronically Signed: D. Jeananne RamaKevin Star Cheese, PA-C 01/29/2021, 8:47 AM   I spent a total of 15  minutes at the the patient's bedside AND on the  patient's hospital floor or unit, greater than 50% of which was counseling/coordinating care for cerebral arteriogram with revascularization of occluded left MCA    Patient ID: Kenneth Yu, male   DOB: Aug 06, 1955, 64 y.o.   MRN: [id number]

## 2021-01-29 NOTE — Procedures (Signed)
Extubation Procedure Note  Patient Details:   Name: Kenneth Yu DOB: Jan 12, 1956 MRN: [id number]   Airway Documentation:    Vent end date: 01/29/21 Vent end time: 0936   Evaluation  O2 sats: stable throughout Complications: No apparent complications Patient did tolerate procedure well. Bilateral Breath Sounds: Clear, Diminished   Yes  RT extubated patient to 3L [place] per MD order with RN at bedside. Positive cuff leak noted. No stridor or distress noted at this time. RT will continue to monitor as needed.   Jaquelyn Bitter 01/29/2021, 9:40 AM

## 2021-01-29 NOTE — Progress Notes (Signed)
  Echocardiogram 2D Echocardiogram has been performed.  Kenneth Yu 01/29/2021, 10:22 AM

## 2021-01-29 NOTE — Progress Notes (Signed)
Transported pt from 4N Rm19 to MRI and back to 4N Rm19. No complications noted

## 2021-01-30 ENCOUNTER — Inpatient Hospital Stay (HOSPITAL_COMMUNITY): Payer: Medicare (Managed Care)

## 2021-01-30 DIAGNOSIS — I6602 Occlusion and stenosis of left middle cerebral artery: Secondary | ICD-10-CM | POA: Diagnosis not present

## 2021-01-30 MED ORDER — ASPIRIN EC 81 MG PO TBEC
81.0000 mg | DELAYED_RELEASE_TABLET | Freq: Every day | ORAL | Status: DC
  Administered 2021-01-30 – 2021-01-31 (×2): 81 mg via ORAL
  Filled 2021-01-30 (×2): qty 1

## 2021-01-30 MED ORDER — FAMOTIDINE 20 MG PO TABS
20.0000 mg | ORAL_TABLET | Freq: Two times a day (BID) | ORAL | Status: DC
  Administered 2021-01-30 – 2021-02-08 (×19): 20 mg via ORAL
  Filled 2021-01-30 (×19): qty 1

## 2021-01-30 NOTE — Progress Notes (Addendum)
   NAME:  Kenneth Yu, MRN:  [id number], DOB:  05-22-1956, LOS: 2 ADMISSION DATE:  01/28/2021, CONSULTATION DATE: 01/28/2021 REFERRING MD:  Julieanne Cotton, MD , CHIEF COMPLAINT: Right-sided weakness  History of Present Illness:   65 year old male with history of hypertension and hyperlipidemia who was brought in the emergency department 7/4 with complaint of right-sided weakness and aphasia. Stroke code was called, patient had CT head which was negative for intracranial hemorrhage, positive for left MCA stroke, CTA head and neck showed M2 occlusion, patient did not receive tPA because he was out of window, he underwent thrombectomy with TICI 3 results, postprocedure patient remained on ventilator, PCCM was consulted for help with evaluation and management  Pertinent  Medical History  HTN  HLD CAD s/p MI  DVT   Significant Hospital Events: Including procedures, antibiotic start and stop dates in addition to other pertinent events   7/4 Admit with R sided weakness/aphasia.  Underwent mechanical thrombectomy 7/5 PSV trials > extubated, following commands, afebrile.   Interim History / Subjective:  Tmax 100 I/O 1.6L UOP, +116ml in last 24 hours  Pt denies pain, indicates he is hungry  Objective   Blood pressure (!) 161/97, pulse 91, temperature 100 F (37.8 C), temperature source Axillary, resp. rate 17, height 5\' 6"  (1.676 m), weight 91.7 kg, SpO2 95 %.    Vent Mode: CPAP;PSV FiO2 (%):  [40 %] 40 % PEEP:  [5 cmH20] 5 cmH20 Pressure Support:  [8 cmH20] 8 cmH20   Intake/Output Summary (Last 24 hours) at 01/30/2021 0758 Last data filed at 01/30/2021 0600 Gross per 24 hour  Intake 1790.17 ml  Output 1650 ml  Net 140.17 ml   Filed Weights   01/28/21 1200 01/28/21 1700  Weight: 94 kg 91.7 kg    Examination: General: adult male lying in bed in NAD HEENT: MM pink/moist, right sided facial droop Neuro: Awake, alert, aphasic, spontaneous movement on left, attempts to move RLE / hip  movement, can not lift against gravity on right, no movement of RUE CV: s1s2 RRR, no m/r/g PULM: non-labored at rest, on RA, lungs clear bilaterally GI: soft, bsx4 active  Extremities: warm/dry, no edema  Skin: no rashes or lesions  Resolved Hospital Problem list     Assessment & Plan:   Acute left MCA stroke status post mechanical thrombectomy Acute respiratory insufficiency postprocedure Hypertension Hyperlipidemia History of DVT on anticoagulation Rhabdomyolysis   -Stroke care per Neurology  -frequent neuro checks  -secondary stroke prophylaxis  -BP goal 120-140  -ASA -continue lisinopril, norvasc -hold home xarelto, defer timing of restart to Neurology  -PT/OT efforts  -dysphagia diet as tolerated  -gentle fluids / encourage PO intake -follow up CK  Best Practice (right click and "Reselect all SmartList Selections" daily)  Diet/type: dysphagia diet (see orders).  DVT prophylaxis: SCD GI prophylaxis: H2B Lines: N/A Foley:  N/A Code Status:  full code Last date of multidisciplinary goals of care discussion: Per primary team  PCCM will sign off. Please call back if new needs arise.   Critical Care Time: n/a    03/31/21, MSN, APRN, NP-C, AGACNP-BC [facility] 01/30/2021, 7:59 AM   Please see [email or web address] for pager details.   From 7A-7P if no response, please call [phone] After hours, please call ELink [phone]

## 2021-01-30 NOTE — Progress Notes (Addendum)
STROKE TEAM PROGRESS NOTE   SUBJECTIVE (INTERVAL HISTORY) Kenneth Yu has had no acute events since the last neurology visit. He is up in chair, NAD. Severely dysarthric with expressive aphasia.. Transfer to floor today. Patient has never been diagnosed with a. Fib. His previous anticoagulation was d/t DVT. Plan for loop recorder at the time of discharge. Consult rehab for CIR.  OBJECTIVE Vitals:   01/30/21 0600 01/30/21 0700 01/30/21 0800 01/30/21 0822  BP: (!) 161/97 (!) 148/104 (!) 160/92   Pulse: 91 85 87   Resp: 17 (!) 22 (!) 24   Temp:      TempSrc:      SpO2: 95% 98% 92% 94%  Weight:      Height:        CBC:  Recent Labs  Lab 01/28/21 1210 01/28/21 1218 01/28/21 1718 01/29/21 0017  WBC 12.1*  --   --  9.8  NEUTROABS 10.3*  --   --  8.5*  HGB 15.3   < > 16.0 14.3  HCT 44.5   < > 47.0 43.0  MCV 85.4  --   --  86.7  PLT 249  --   --  236   < > = values in this interval not displayed.    Basic Metabolic Panel:  Recent Labs  Lab 01/28/21 1210 01/28/21 1218 01/28/21 1718 01/29/21 0017  NA 142 143 140 139  K 3.9 3.8 3.9 4.2  CL 105 105  --  105  CO2 26  --   --  26  GLUCOSE 101* 96  --  123*  BUN 13 13  --  17  CREATININE 1.13 1.00  --  1.24  CALCIUM 9.1  --   --  8.8*    Lipid Panel:  Recent Labs  Lab 01/29/21 0017  CHOL 172  TRIG 150*  HDL 48  CHOLHDL 3.6  VLDL 30  LDLCALC 94   HgbA1c:  Lab Results  Component Value Date   HGBA1C 5.9 (H) 01/29/2021    IMAGING  Results for orders placed or performed during the hospital encounter of 01/28/21  MR BRAIN WO CONTRAST   Narrative   CLINICAL DATA:  Right-sided weakness.  History of stroke  EXAM: MRI HEAD WITHOUT CONTRAST  TECHNIQUE: Multiplanar, multiecho pulse sequences of the brain and surrounding structures were obtained without intravenous contrast.  COMPARISON:  CT and CTA from yesterday  FINDINGS: Brain: Large area of cortically based infarction in the lateral left frontal lobe,  MCA branch distribution. Minimal patchy left caudate acute infarcts. Petechial hemorrhage is present.  Remote cortically based infarct affecting the right occipital lobe. Chronic lacunar infarcts in the bilateral deep gray nuclei. Chronic small right cerebellar infarct. Confluent chronic small vessel ischemic gliosis in the deep white matter. Brain volume is normal.  Chronic micro hemorrhages which are mainly peripheral but in areas that were likely affected by prior infarction.  No hydrocephalus, collection, or masslike finding.  Vascular: Preserved flow voids.  Skull and upper cervical spine: Normal marrow signal  Sinuses/Orbits: Nasopharyngeal and sinus opacification in the setting of intubation. Bilateral cataract resection.  IMPRESSION: 1. Large acute left MCA branch infarct with petechial hemorrhage. Minimal involvement in the left caudate head. 2. Chronic small vessel disease with chronic lacunar infarcts. Prior right PCA territory infarct.   Electronically Signed   By: Marnee Spring M.D.   On: 01/29/2021 05:29     PHYSICAL EXAM  General exam Pleasant middle-aged African-American male HEENT-  Normocephalic, no lesions, without  obvious abnormality.  Normal external eye and conjunctiva.   Cardiovascular- regular rhythm, on tele, tachycardic   Lungs-clear to auscultation.  Abdomen- soft Musculoskeletal-no joint deformity; deep scarring on the medial aspect of the left mid-leg. Skin-warm and dry, well-perfused; redness on the left knee.  Neurologic Exam:  Mental Status: Alert, Able to follow commands.  Expressive aphasia speaks only occasional words and severe dysarthria.  Good comprehension and follows most commands. Cranial Nerves: II:  No blink to threat on the right, no gaze preference.  III,IV, VI: ptosis not present, extra-ocular motions intact bilaterally V,VII: Face symmetric but difficult to assess 2/2 tube VIII: hearing normal bilaterally IX,X: Unable  to assess XI: Unable to assess XII: midline tongue extension  Motor: Right : Upper extremity   2/5    Left:     Upper extremity   3/5  Lower extremity   1/5     Lower extremity   3/5 Cerebellar: Unable to assess Gait: Deferred   ASSESSMENT/PLAN Kenneth Yu is a 65 y.o. male with a past medical history significant for stroke, atrial fibrillation (INR 1.1), DVT, HTN, myocardial infarction, HLD who presented to [facility] after being found by his ex-wife with right-sided weakness and aphasia. On arrival, imaging showed a distal left M2 MCA branch occlusion with a large area of penumbra and the CT perfusion is favorable for intervention and was taken for emergent thrombectomy. More recent MR this morning showed a large acute left MCA branch infarct with petechial hemorrhage.  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 LDL 94; Add Lipitor 80mg  when cleared for PO intake HgbA1c 5.9 Continue 300mg  aspirin suppository until cleared for PO intake Ongoing aggressive stroke risk factor management PT/OT/SLP consults appreciated Plan for loop recorder  Hypertension 120s-150s/70s-90s Continue lisinopril, cleviprex Permissive hypertension (OK if < 220/120) but gradually normalize in 5-7 days Long-term BP goal normotensive  Hyperlipidemia LDL 94, goal < 70 Add Lipitor 80mg  daily when cleared for PO intake Continue statin at discharge  Hospital day # 2  , MSN, NP-C [facility] [phone].   I have personally obtained history,examined this patient, reviewed notes, independently viewed imaging studies, participated in medical decision making and plan of care.ROS completed by me personally and pertinent positives fully documented  I have made any additions or clarifications directly to the above note. Agree with note above.   Reviewed patient's chart in detail he had a history of DVT in 2015 and was treated with Xarelto for 6 to 9 months and since then was off it.  He has no documented history of atrial fibrillation or history to suggest palpitations or syncope.  No definite etiology for his embolic stroke has been found hence it would be appropriate to do loop recorder placement for paroxysmal A. fib at discharge.  Continue mobilization out of bed and ongoing therapy consults.  Transfer to neurology floor bed.  Greater than 50% time during this 35-minute visit was spent on counseling and coordination of care about his stroke and discussion with care team and answering questions.  Valentina Lucks, MD Medical Director [facility] Pager: [phone] 01/30/2021 2:36 PM  To contact Stroke Continuity provider, please refer to [facility]. After hours, contact General Neurology

## 2021-01-30 NOTE — Progress Notes (Signed)
Inpatient Rehab Admissions Coordinator Note:   Per therapy recommendations, pt was screened for CIR candidacy by Megan Salon, MS CCC-SLP. At this time, it appears that Pt. Would benefit from inpatient rehab admission; however, CIR at [facility] is not in network with Pt.'s insurer. TOC will need to send referrals to other IRFs that may be in network with Pt.'s insurance.   Megan Salon, MS, CCC-SLP Rehab Admissions Coordinator  [phone] (celll) [phone] (office)

## 2021-01-30 NOTE — Progress Notes (Signed)
Inpatient Rehabilitation Admissions Coordinator   [employer] is not in network with [facility] as previously noted by [facility], Admissions Coordinator. Other AIR in network will need to be pursued. We will signoff.  Ottie Glazier, RN, MSN Rehab Admissions Coordinator [phone] 01/30/2021 2:31 PM

## 2021-01-30 NOTE — Evaluation (Signed)
Occupational Therapy Evaluation (Delayed entry for 7/5) Patient Details Name: Kenneth Yu MRN: [id number] DOB: Oct 18, 1955 Today's Date: 01/30/2021    History of Present Illness 65 yo male s/p R side weakness, aphasia and ?R hemianopsia.  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 PMHx: HTN, HLD.   Clinical Impression   Pt PTA: Pt was living alone and reports independence; pt unable to verbalize at this time and unsure of accuracy of PLOF info received from pt. Pt currently, limited by decreased cognition, difficulty attending to task without perseveration, decreased ability to care for self and decreased use of R side with RUE flaccid.Pt minA to totalA for ADL and modA +2 for bed mobility and sit to stand taking 2 steps with max support on R side. Pt would greatly benefit from continued OT skilled services. OT following acutely.      Follow Up Recommendations  CIR    Equipment Recommendations  3 in 1 bedside commode;Wheelchair (measurements OT);Wheelchair cushion (measurements OT);Hospital bed    Recommendations for Other Services Rehab consult     Precautions / Restrictions Precautions Precautions: Fall Restrictions Weight Bearing Restrictions: No      Mobility Bed Mobility Overal bed mobility: Needs Assistance Bed Mobility: Supine to Sit;Sit to Supine     Supine to sit: Mod assist;+2 for physical assistance Sit to supine: Mod assist;+2 for physical assistance   General bed mobility comments: mod +2 for trunk and LE management, scooting to/from EOB, and boost up in bed upon return to supine. Increased assist for RLE/RUE, pt choosing to exit bed towards L.    Transfers Overall transfer level: Needs assistance Equipment used: 2 person hand held assist Transfers: Sit to/from Stand Sit to Stand: Mod assist;+2 physical assistance         General transfer comment: Mod +2 for rise, steady, and correcting  posture to upright midline. Pt with preference for R lateral leaning. Pt took x2 steps bilat towards HOB, very untrusting of RLE and limited step length LLE due to this.    Balance Overall balance assessment: Needs assistance Sitting-balance support: Single extremity supported;Feet supported Sitting balance-Leahy Scale: Fair Sitting balance - Comments: able to sit statically EOB without external assist, posterior and R lateral leaning with dynamic activity/fatigue Postural control: Posterior lean;Right lateral lean Standing balance support: During functional activity;Bilateral upper extremity supported Standing balance-Leahy Scale: Poor Standing balance comment: reliant on external support                           ADL either performed or assessed with clinical judgement   ADL Overall ADL's : Needs assistance/impaired Eating/Feeding: NPO   Grooming: Minimal assistance   Upper Body Bathing: Maximal assistance   Lower Body Bathing: Total assistance   Upper Body Dressing : Maximal assistance   Lower Body Dressing: Maximal assistance   Toilet Transfer: Moderate assistance;+2 for physical assistance;Squat-pivot (w)   Toileting- Clothing Manipulation and Hygiene: Maximal assistance       Functional mobility during ADLs: Moderate assistance;+2 for physical assistance;+2 for safety/equipment;Cueing for safety;Cueing for sequencing General ADL Comments: Pt limited by decreased cognition, difficulty attending to task without perseveration, decreased ability to care for self and decreased motivation.     Vision Baseline Vision/History: Wears glasses Wears Glasses: Reading only Patient Visual Report: Other (comment) Additional Comments: difficult to assess; pt turning head with cues     Perception     Praxis  Pertinent Vitals/Pain Pain Assessment: Faces Faces Pain Scale: No hurt Pain Intervention(s): Monitored during session     Hand Dominance Right    Extremity/Trunk Assessment Upper Extremity Assessment Upper Extremity Assessment: RUE deficits/detail RUE Deficits / Details: 0/5, reports decreased sensation RUE Sensation: decreased light touch RUE Coordination: decreased fine motor;decreased gross motor   Lower Extremity Assessment Lower Extremity Assessment: Defer to PT evaluation RLE Deficits / Details: movement in RLE noted   Cervical / Trunk Assessment Cervical / Trunk Assessment: Normal   Communication Communication Communication: Expressive difficulties   Cognition Arousal/Alertness: Awake/alert Behavior During Therapy: WFL for tasks assessed/performed;Flat affect Overall Cognitive Status: Difficult to assess Area of Impairment: Attention;Following commands;Safety/judgement;Problem solving                   Current Attention Level: Sustained   Following Commands: Follows one step commands inconsistently;Follows one step commands with increased time Safety/Judgement: Decreased awareness of deficits;Decreased awareness of safety   Problem Solving: Slow processing;Decreased initiation;Difficulty sequencing;Requires verbal cues;Requires tactile cues General Comments: Pt perseverative on certain things to count stairs with hands and max cues to continue to answer new info for PLOF. Pt oriented to self, confused about situation. No verbalizations made and few attempts to mouth words.   General Comments  BP sitting: 142/89 (103), 85-93% O2 on 3L, 103 BPM; supine after exertion: 138/87, 117 BPM.    Exercises     Shoulder Instructions      Home Living Family/patient expects to be discharged to:: Private residence Living Arrangements: Alone Available Help at Discharge: Family;Friend(s);Available PRN/intermittently Type of Home: Apartment Home Access: Stairs to enter Entrance Stairs-Number of Steps: 10   Home Layout: One level     Bathroom Shower/Tub: Tub/shower unit         Home Equipment: Environmental consultant - 2  wheels;Cane - single point   Additional Comments: unsure of accuracy as pt was nonverbal but gesturing; attemtping to mouth words      Prior Functioning/Environment Level of Independence: Independent with assistive device(s);Needs assistance  Gait / Transfers Assistance Needed: cane with mobility; requires transportation ADL's / Homemaking Assistance Needed: independent; cooking; grocery shopping; medication management            OT Problem List: Decreased strength;Decreased range of motion;Decreased activity tolerance;Impaired balance (sitting and/or standing);Impaired vision/perception;Decreased coordination;Decreased cognition;Decreased safety awareness;Impaired UE functional use;Pain;Increased edema;Cardiopulmonary status limiting activity      OT Treatment/Interventions: Self-care/ADL training;Therapeutic exercise;Neuromuscular education;Energy conservation;DME and/or AE instruction;Therapeutic activities;Cognitive remediation/compensation;Visual/perceptual remediation/compensation;Patient/family education    OT Goals(Current goals can be found in the care plan section) Acute Rehab OT Goals Patient Stated Goal: none stated OT Goal Formulation: With patient Time For Goal Achievement: 02/13/21 Potential to Achieve Goals: Good ADL Goals Pt Will Perform Eating: with set-up;with adaptive utensils Pt Will Perform Grooming: with min assist;standing Pt Will Transfer to Toilet: with mod assist;with +2 assist;squat pivot transfer;stand pivot transfer Pt/caregiver will Perform Home Exercise Program: Increased strength;Increased ROM;Right Upper extremity;With minimal assist;With written HEP provided Additional ADL Goal #1: Pt will state and utilize 3 ADL items for intended purpose with  minimal cues in 3/5 trials. Additional ADL Goal #2: Pt will perform 1-2 step commands with minimal cues for sequencing to increase independence for ADL.  OT Frequency: Min 2X/week   Barriers to D/C:             Co-evaluation PT/OT/SLP Co-Evaluation/Treatment: Yes Reason for Co-Treatment: Complexity of the patient's impairments (multi-system involvement)   OT goals addressed during session: ADL's and self-care;Strengthening/ROM  AM-PAC OT "6 Clicks" Daily Activity     Outcome Measure Help from another person eating meals?: A Little Help from another person taking care of personal grooming?: A Little Help from another person toileting, which includes using toliet, bedpan, or urinal?: A Lot Help from another person bathing (including washing, rinsing, drying)?: A Lot Help from another person to put on and taking off regular upper body clothing?: A Little Help from another person to put on and taking off regular lower body clothing?: A Lot 6 Click Score: 15   End of Session Equipment Utilized During Treatment: Gait belt Nurse Communication: Mobility status  Activity Tolerance: Patient tolerated treatment well Patient left: in bed;with call bell/phone within reach;with bed alarm set  OT Visit Diagnosis: Unsteadiness on feet (R26.81);Muscle weakness (generalized) (M62.81);Hemiplegia and hemiparesis;Other symptoms and signs involving cognitive function Hemiplegia - Right/Left: Right Hemiplegia - dominant/non-dominant: Dominant Hemiplegia - caused by: Cerebral infarction                Time: 1210-1239 OT Time Calculation (min): 29 min Charges:  OT General Charges $OT Visit: 1 Visit OT Evaluation $OT Eval Moderate Complexity: 1 Mod  Flora Lipps, OTR/L Acute Rehabilitation Services Pager: [phone] Office: [phone]   Lonzo Cloud 01/30/2021, 5:38 AM

## 2021-01-30 NOTE — Progress Notes (Signed)
Referring Physician(s): Amada Jupiter  Supervising Physician: Julieanne Cotton  Patient Status:  [facility] - In-pt  Chief Complaint:  Code stroke - f/u cerebral intervention  Brief History:  Vittorio Mohs is a 65 y.o. male who was last known well at 16 on 07/03.   The morning of 7/4 his ex-wife went to see him and found him aphasic with right sided weakness.   CT/CTA/CTP.  There was infarct on the CT, but it seems there is a large area of penumbra and the CT perfusion is favorable for intervention.    He is S/P Lt common carotid arteriogram . RT CFAapproach S/P complete revascularization of occluded Lt MCA M2 branch of Sup division with x 1 pass with 4 mm x 40 mm solitaireX retriever and contact aspiration acieving a TICI 3 revascularization. By Dr. Corliss Skains.   Subjective:  Now extubated.   Allergies: Patient has no known allergies.  Medications: Prior to Admission medications   Medication Sig Start Date End Date Taking? Authorizing Provider  acetaminophen (TYLENOL) 325 MG tablet Take 650 mg by mouth every 4 (four) hours as needed.    [provider]  aspirin 81 MG tablet Take 81 mg by mouth daily.    [provider]  atorvastatin (LIPITOR) 80 MG tablet Take 80 mg by mouth daily.    [provider]  bisacodyl (DULCOLAX) 10 MG suppository Place 10 mg rectally daily as needed for moderate constipation.    [provider]  docusate sodium (COLACE) 100 MG capsule Take 100 mg by mouth 2 (two) times daily.    [provider]  lisinopril (PRINIVIL,ZESTRIL) 10 MG tablet Take 10 mg by mouth daily.    [provider]  magnesium hydroxide (MILK OF MAGNESIA) 800 MG/5ML suspension Take 30 mLs by mouth daily as needed for constipation.    [provider]  rivaroxaban (XARELTO) 20 MG TABS tablet Take 20 mg by mouth daily with supper.    [provider]     Vital Signs: BP (!) 156/91   Pulse 97   Temp 98.7 F  (37.1 C) (Oral)   Resp (!) 21   Ht 5\' 6"  (1.676 m)   Wt 91.7 kg   SpO2 100%   BMI 32.63 kg/m   Physical Exam HENT:     Head: Normocephalic and atraumatic.  Cardiovascular:     Rate and Rhythm: Normal rate.  Pulmonary:     Effort: Pulmonary effort is normal. No respiratory distress.  Skin:    General: Skin is warm and dry.  Neurological:     Mental Status: He is alert.  Alert, awake Follows simple commands + Aphasia and dysarthria Right facial droop Tongue deviates to the right. Right :  Upper extremity   2/5               Lower extremity   1/5 Left:      Upper extremity   3/5                                                               Lower extremity   3/5  Imaging: CT Head Wo Contrast  Result Date: 01/30/2021 CLINICAL DATA:  Stroke follow-up. EXAM: CT HEAD WITHOUT CONTRAST TECHNIQUE: Contiguous axial images were obtained from the base  of the skull through the vertex without intravenous contrast. COMPARISON:  Brain MRI 01/29/2021. Noncontrast head CT and CT angiogram head/neck 01/28/2021. FINDINGS: Brain: Mild generalized parenchymal atrophy. Redemonstrated evolving acute cortically based left MCA territory infarct within the left frontoparietal lobes and left insula. Redemonstrated mild corresponding petechial hemorrhage. Small acute infarcts were better appreciated on the prior MRI. Chronic cortically based right occipital temporal lobe infarct. Chronic lacunar infarcts within the bilateral corona radiata and deep gray nuclei. Stable background moderate/advanced chronic small vessel ischemic disease within the cerebral white matter. Redemonstrated small chronic infarct within the right cerebellar hemisphere. No extra-axial fluid collection. No evidence of an intracranial mass. No midline shift. Vascular: Atherosclerotic calcifications Skull: Normal. Negative for fracture or focal lesion. Sinuses/Orbits: Visualized orbits show no acute finding. Trace mucosal thickening within the  right ethmoid air cells. Fluid levels and background mild mucosal thickening within the right greater than left sphenoid sinuses. Trace left maxillary sinus mucosal thickening. IMPRESSION: Evolving moderate to large acute left MCA territory infarct, not appreciably changed in extent from the brain MRI of 01/29/2021. As before, there is mild petechial hemorrhage within the infarction territory. Associated small acute infarcts within the left caudate nucleus were better appreciated on the prior MRI. Otherwise stable non-contrast CT appearance of the brain. 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 Background chronic small vessel ischemic disease with multiple chronic lacunar infarcts. Small chronic infarct within the right cerebellar hemisphere. Mild generalized cerebral atrophy. Paranasal sinus disease, as described. Electronically Signed   By: Jackey Loge DO   On: 01/30/2021 08:30   MR BRAIN WO CONTRAST  Result Date: 01/29/2021 CLINICAL DATA:  Right-sided weakness.  History of stroke EXAM: MRI HEAD WITHOUT CONTRAST TECHNIQUE: Multiplanar, multiecho pulse sequences of the brain and surrounding structures were obtained without intravenous contrast. COMPARISON:  CT and CTA from yesterday FINDINGS: Brain: Large area of cortically based infarction in the lateral left frontal lobe, MCA branch distribution. Minimal patchy left caudate acute infarcts. Petechial hemorrhage is present. Remote cortically based infarct affecting the right occipital lobe. Chronic lacunar infarcts in the bilateral deep gray nuclei. Chronic small right cerebellar infarct. Confluent chronic small vessel ischemic gliosis in the deep white matter. Brain volume is normal. Chronic micro hemorrhages which are mainly peripheral but in areas that were likely affected by prior infarction. No hydrocephalus, collection, or masslike finding. Vascular: Preserved flow voids. Skull and upper cervical spine: Normal marrow signal Sinuses/Orbits:  Nasopharyngeal and sinus opacification in the setting of intubation. Bilateral cataract resection. IMPRESSION: 1. Large acute left MCA branch infarct with petechial hemorrhage. Minimal involvement in the left caudate head. 2. Chronic small vessel disease with chronic lacunar infarcts. Prior right PCA territory infarct. Electronically Signed   By: Marnee Spring M.D.   On: 01/29/2021 05:29   IR CT Head Ltd  Result Date: 01/30/2021 INDICATION: New onset aphasia with right-sided weakness. Occluded superior division of the left middle cerebral artery proximally on CT angiogram of the head and neck. EXAM: 1. EMERGENT LARGE VESSEL OCCLUSION THROMBOLYSIS (anterior CIRCULATION) COMPARISON:  CT angiogram of the head and neck of January 28, 2021. MEDICATIONS: Ancef 2 g IV antibiotic was administered within 1 hour of the procedure. ANESTHESIA/SEDATION: General anesthesia. CONTRAST:  Omnipaque 300 55 mL. FLUOROSCOPY TIME:  Fluoroscopy Time: 20 minutes 18 seconds (1160 mGy). COMPLICATIONS: None immediate. TECHNIQUE: Emergent consent was signed to physicians. No family members or next of kin were available on phone or physically. Patient was unable to provide consent due to his medical  condition. The patient was then put under general anesthesia by the Department of Anesthesiology at [facility]. The right groin was prepped and draped in the usual sterile fashion. Thereafter using modified Seldinger technique, transfemoral access into the right common femoral artery was obtained without difficulty. Over a 0.035 inch guidewire an 8 [place] Pinnacle 25 cm sheath was inserted. Through this, and also over a 0.035 inch guidewire a 5 [place] JB 1 catheter was advanced to the aortic arch region and selectively positioned in the left common carotid artery. FINDINGS: The left common carotid arteriogram demonstrates the left external carotid artery and its major branches to be widely patent. The left internal carotid artery at the  bulb has mild narrowing proximally. More distally the vessel is seen to opacify to the cranial skull base. The petrous, cavernous and supraclinoid segments are patent with focal area of mild stenosis in the caval segment. The left anterior cerebral artery opacifies normally into the capillary and venous phases. The left middle cerebral artery M1 segment is widely patent. The inferior division is normal into the capillary and venous phases. The superior division demonstrates a prominent side branch occlusion proximally. PROCEDURE: The diagnostic JB 1 catheter in the left common carotid artery was then exchanged over a 0.035 inch 300 cm Rosen exchange guidewire for an 087 balloon guide catheter which had been prepped with 50% contrast and 50% heparinized saline infusion. Guidewire was removed. Good aspiration obtained from the hub of the balloon guide catheter. Gentle control arteriogram performed through this demonstrated no evidence of spasms, dissections or of intraluminal filling defects. Over a 0.035 Roadrunner guidewire, this was then advanced to the distal 1/3 of the left internal carotid artery. The guidewire was removed. Again good aspiration obtained from the hub of the balloon guide catheter. Control arteriogram performed showed no evidence of spasms, dissections or of intraluminal filling defects. Over a 0.014 inch standard Synchro micro guidewire with a J-tip configuration the combination of an 055 Zoom aspiration and 136 cm catheter inside of which was a 160 cm 021 Trevo ProVue microcatheter was advanced to the supraclinoid left ICA. The micro guidewire was then gently maneuvered with a torque device into the occluded segment of the left middle cerebral artery superior division into the distal M3 region followed by the microcatheter. The guidewire was removed. Good aspiration obtained from the hub of the microcatheter. Gentle control arteriogram performed through the microcatheter demonstrated safe  position of the tip of the microcatheter with antegrade flow of contrast. This was then connected to continuous heparin saline infusion. A 4 mm x 40 mm Solitaire X retrieval device was then advanced to the distal end of the microcatheter. O ring on the delivery microcatheter was loosened. With slight forward gentle traction with the right hand on the delivery micro guidewire with the left hand the delivery microcatheter was retrieved deploying the retrieval device the proximal end of which was just proximal to the occluded inferior division. 055 Zoom aspiration catheter was advanced and engaged at the origin of the occluded branch. With proximal flow arrest in the left internal carotid artery, and with constant aspiration being applied the hub of 055 aspiration catheter with the Penumbra aspiration device, and a 20 mL syringe at the hub of the balloon guide catheter in the left internal carotid artery for approximately 2-1/2 minutes, the combination of the retrieval device, the microcatheter, and the Zoom aspiration catheter was retrieved and removed. Flow arrest was reversed in the left internal carotid artery. A  control arteriogram performed through the balloon guide in the proximal left internal carotid artery demonstrated moderate spasm in the mid cervical left ICA. A 3 mm x 1 mm clot was noted entangled in the retrieval device. Wide patency was now noted of the previously occluded superior division inferior branch achieving a TICI 3 revascularization of the left middle cerebral artery distribution. The left anterior cerebral artery remained unchanged widely patent. Spasm noted at the distal end of the left internal carotid artery gradually resolved after 4 aliquots of 25 mcg of nitroglycerin intra-arterially. The balloon guide was then retrieved into the left common carotid artery. A final control arteriogram performed through the balloon guide catheter demonstrated near complete resolution of the previous  noted spasm in the left internal carotid artery distally. The left MCA and ACA distributions continued to show complete revascularization. The balloon guide was then removed. The 8 French sheath was removed with hemostasis achieved with manual compression for approximately 20 minutes. Distal pulses remained Dopplerable in both feet unchanged. A CT of the brain demonstrated no evidence of intracranial hemorrhages. Patient was left intubated waiting the results of the COVID-19 test. IMPRESSION: Status post endovascular complete revascularization of occluded prominent side branch of the superior division of the left middle cerebral artery with 1 pass with a 4 mm x 40 mm Solitaire X retrieval device and contact aspiration achieving a TICI 3 revascularization. PLAN: Follow-up as per referring MD. Electronically Signed   By: Julieanne Cotton M.D.   On: 01/29/2021 13:05   DG CHEST PORT 1 VIEW  Result Date: 01/29/2021 CLINICAL DATA:  Endotracheal tube placement EXAM: PORTABLE CHEST 1 VIEW COMPARISON:  08/07/2018 FINDINGS: Endotracheal tube placed with tip measuring 3.6 cm above the carina. Enteric tube placed with tip not visualized off the field of view but below the left hemidiaphragm. Cardiac enlargement. Small left pleural effusion. Atelectasis or infiltration in both lung bases. No pneumothorax. Calcification of the aorta. IMPRESSION: Appliances appear in satisfactory position. Probable small left pleural effusion with atelectasis or infiltration in both lung bases. Electronically Signed   By: Burman Nieves M.D.   On: 01/29/2021 02:49   DG Abd Portable 1V  Result Date: 01/28/2021 CLINICAL DATA:  OG tube placement. EXAM: PORTABLE ABDOMEN - 1 VIEW COMPARISON:  None. FINDINGS: Tip and side port of the enteric tube below the diaphragm in the stomach. Air throughout nondilated small and large bowel in the upper abdomen. IMPRESSION: Tip and side port of the enteric tube below the diaphragm in the stomach.  Electronically Signed   By: Narda Rutherford M.D.   On: 01/28/2021 18:02   ECHOCARDIOGRAM COMPLETE  Result Date: 01/29/2021    ECHOCARDIOGRAM REPORT   Patient Name:   Kenneth Yu Date of Exam: 01/29/2021 Medical Rec #:  [id number]     Height:       66.0 in Accession #:    [id number]    Weight:       202.2 lb Date of Birth:  [birth year]    BSA:          2.009 m Patient Age:    64 years      BP:           143/82 mmHg Patient Gender: M             HR:           98 bpm. Exam Location:  Inpatient Procedure: 2D Echo, Cardiac Doppler, Color Doppler and Intracardiac  Opacification Agent Indications:    Stroke  History:        Patient has no prior history of Echocardiogram examinations.                 Arrythmias:Atrial Fibrillation; Risk Factors:Former Smoker. H/O                 CVA.  Sonographer:    Ross Ludwig RDCS (AE) Referring Phys: [phone] [facility]  Sonographer Comments: Suboptimal apical window. IMPRESSIONS  1. Left ventricular ejection fraction, by estimation, is 50 to 55%. The left ventricle has low normal function. The left ventricle demonstrates regional wall motion abnormalities (lateral wall hypokinesis). There is mild concentric left ventricular hypertrophy. Left ventricular diastolic parameters are consistent with Grade II diastolic dysfunction (pseudonormalization).  2. Right ventricular systolic function is normal. The right ventricular size is mildly enlarged. There is mildly elevated pulmonary artery systolic pressure. The estimated right ventricular systolic pressure is 40.5 mmHg.  3. Left atrial size was mildly dilated.  4. Right atrial size was severely dilated.  5. The mitral valve is grossly normal. Mild mitral valve regurgitation. The mean mitral valve gradient is 3.0 mmHg with average heart rate of 89 bpm.  6. The aortic valve is tricuspid. There is mild calcification of the aortic valve. There is mild thickening of the aortic valve. Aortic valve regurgitation is not  visualized. Mild aortic valve sclerosis is present, with no evidence of aortic valve stenosis.  7. The inferior vena cava is normal in size with <50% respiratory variability, suggesting right atrial pressure of 8 mmHg. FINDINGS  Left Ventricle: Left ventricular ejection fraction, by estimation, is 50 to 55%. The left ventricle has low normal function. The left ventricle demonstrates regional wall motion abnormalities. Definity contrast agent was given IV to delineate the left ventricular endocardial borders. The left ventricular internal cavity size was normal in size. There is mild concentric left ventricular hypertrophy. Left ventricular diastolic parameters are consistent with Grade II diastolic dysfunction (pseudonormalization).  LV Wall Scoring: The mid and distal lateral wall, posterior wall, mid anterolateral segment, and apical anterior segment are hypokinetic. The basal anterolateral segment is normal. Right Ventricle: The right ventricular size is mildly enlarged. No increase in right ventricular wall thickness. Right ventricular systolic function is normal. There is mildly elevated pulmonary artery systolic pressure. The tricuspid regurgitant velocity is 2.85 m/s, and with an assumed right atrial pressure of 8 mmHg, the estimated right ventricular systolic pressure is 40.5 mmHg. Left Atrium: Left atrial size was mildly dilated. Right Atrium: Right atrial size was severely dilated. Pericardium: There is no evidence of pericardial effusion. Mitral Valve: The mitral valve is grossly normal. Mild mitral valve regurgitation. MV peak gradient, 6.8 mmHg. The mean mitral valve gradient is 3.0 mmHg with average heart rate of 89 bpm. Tricuspid Valve: The tricuspid valve is normal in structure. Tricuspid valve regurgitation is mild . No evidence of tricuspid stenosis. Aortic Valve: The aortic valve is tricuspid. There is mild calcification of the aortic valve. There is mild thickening of the aortic valve. Aortic  valve regurgitation is not visualized. Mild aortic valve sclerosis is present, with no evidence of aortic valve stenosis. Aortic valve mean gradient measures 5.0 mmHg. Aortic valve peak gradient measures 10.1 mmHg. Aortic valve area, by VTI measures 1.59 cm. Pulmonic Valve: The pulmonic valve was normal in structure. Pulmonic valve regurgitation is not visualized. No evidence of pulmonic stenosis. Aorta: The aortic root and ascending aorta are structurally normal, with no  evidence of dilitation. Venous: The inferior vena cava is normal in size with less than 50% respiratory variability, suggesting right atrial pressure of 8 mmHg. IAS/Shunts: The atrial septum is grossly normal.  LEFT VENTRICLE PLAX 2D LVIDd:         5.10 cm  Diastology LVIDs:         3.60 cm  LV e' medial:    7.40 cm/s LV PW:         1.60 cm  LV E/e' medial:  15.1 LV IVS:        1.80 cm  LV e' lateral:   7.78 cm/s LVOT diam:     1.90 cm  LV E/e' lateral: 14.4 LV SV:         44 LV SV Index:   22 LVOT Area:     2.84 cm  RIGHT VENTRICLE             IVC RV Basal diam:  4.30 cm     IVC diam: 2.30 cm RV S prime:     12.30 cm/s TAPSE (M-mode): 1.4 cm LEFT ATRIUM             Index       RIGHT ATRIUM           Index LA diam:        3.60 cm 1.79 cm/m  RA Area:     36.50 cm LA Vol (A2C):   73.0 ml 36.34 ml/m RA Volume:   156.00 ml 77.65 ml/m LA Vol (A4C):   73.0 ml 36.34 ml/m LA Biplane Vol: 74.3 ml 36.98 ml/m  AORTIC VALVE AV Area (Vmax):    1.66 cm AV Area (Vmean):   1.40 cm AV Area (VTI):     1.59 cm AV Vmax:           159.00 cm/s AV Vmean:          108.000 cm/s AV VTI:            0.275 m AV Peak Grad:      10.1 mmHg AV Mean Grad:      5.0 mmHg LVOT Vmax:         93.20 cm/s LVOT Vmean:        53.200 cm/s LVOT VTI:          0.154 m LVOT/AV VTI ratio: 0.56  AORTA Ao Root diam: 3.10 cm Ao Asc diam:  3.60 cm MITRAL VALVE                TRICUSPID VALVE MV Area (PHT): 4.41 cm     TR Peak grad:   32.5 mmHg MV Area VTI:   1.85 cm     TR Vmax:         285.00 cm/s MV Peak grad:  6.8 mmHg MV Mean grad:  3.0 mmHg     SHUNTS MV Vmax:       1.30 m/s     Systemic VTI:  0.15 m MV Vmean:      75.2 cm/s    Systemic Diam: 1.90 cm MV Decel Time: 172 msec MV E velocity: 112.00 cm/s MV A velocity: 121.00 cm/s MV E/A ratio:  0.93 Riley Lam MD Electronically signed by Riley Lam MD Signature Date/Time: 01/29/2021/12:28:37 PM    Final    IR PERCUTANEOUS ART THROMBECTOMY/INFUSION INTRACRANIAL INC DIAG ANGIO  Result Date: 01/30/2021 INDICATION: New onset aphasia with right-sided weakness. Occluded superior division of the left middle cerebral artery proximally on CT  angiogram of the head and neck. EXAM: 1. EMERGENT LARGE VESSEL OCCLUSION THROMBOLYSIS (anterior CIRCULATION) COMPARISON:  CT angiogram of the head and neck of January 28, 2021. MEDICATIONS: Ancef 2 g IV antibiotic was administered within 1 hour of the procedure. ANESTHESIA/SEDATION: General anesthesia. CONTRAST:  Omnipaque 300 55 mL. FLUOROSCOPY TIME:  Fluoroscopy Time: 20 minutes 18 seconds (1160 mGy). COMPLICATIONS: None immediate. TECHNIQUE: Emergent consent was signed to physicians. No family members or next of kin were available on phone or physically. Patient was unable to provide consent due to his medical condition. The patient was then put under general anesthesia by the Department of Anesthesiology at [facility]. The right groin was prepped and draped in the usual sterile fashion. Thereafter using modified Seldinger technique, transfemoral access into the right common femoral artery was obtained without difficulty. Over a 0.035 inch guidewire an 8 [place] Pinnacle 25 cm sheath was inserted. Through this, and also over a 0.035 inch guidewire a 5 [place] JB 1 catheter was advanced to the aortic arch region and selectively positioned in the left common carotid artery. FINDINGS: The left common carotid arteriogram demonstrates the left external carotid artery and its major branches to be  widely patent. The left internal carotid artery at the bulb has mild narrowing proximally. More distally the vessel is seen to opacify to the cranial skull base. The petrous, cavernous and supraclinoid segments are patent with focal area of mild stenosis in the caval segment. The left anterior cerebral artery opacifies normally into the capillary and venous phases. The left middle cerebral artery M1 segment is widely patent. The inferior division is normal into the capillary and venous phases. The superior division demonstrates a prominent side branch occlusion proximally. PROCEDURE: The diagnostic JB 1 catheter in the left common carotid artery was then exchanged over a 0.035 inch 300 cm Rosen exchange guidewire for an 087 balloon guide catheter which had been prepped with 50% contrast and 50% heparinized saline infusion. Guidewire was removed. Good aspiration obtained from the hub of the balloon guide catheter. Gentle control arteriogram performed through this demonstrated no evidence of spasms, dissections or of intraluminal filling defects. Over a 0.035 Roadrunner guidewire, this was then advanced to the distal 1/3 of the left internal carotid artery. The guidewire was removed. Again good aspiration obtained from the hub of the balloon guide catheter. Control arteriogram performed showed no evidence of spasms, dissections or of intraluminal filling defects. Over a 0.014 inch standard Synchro micro guidewire with a J-tip configuration the combination of an 055 Zoom aspiration and 136 cm catheter inside of which was a 160 cm 021 Trevo ProVue microcatheter was advanced to the supraclinoid left ICA. The micro guidewire was then gently maneuvered with a torque device into the occluded segment of the left middle cerebral artery superior division into the distal M3 region followed by the microcatheter. The guidewire was removed. Good aspiration obtained from the hub of the microcatheter. Gentle control arteriogram  performed through the microcatheter demonstrated safe position of the tip of the microcatheter with antegrade flow of contrast. This was then connected to continuous heparin saline infusion. A 4 mm x 40 mm Solitaire X retrieval device was then advanced to the distal end of the microcatheter. O ring on the delivery microcatheter was loosened. With slight forward gentle traction with the right hand on the delivery micro guidewire with the left hand the delivery microcatheter was retrieved deploying the retrieval device the proximal end of which was just proximal to the occluded  inferior division. 055 Zoom aspiration catheter was advanced and engaged at the origin of the occluded branch. With proximal flow arrest in the left internal carotid artery, and with constant aspiration being applied the hub of 055 aspiration catheter with the Penumbra aspiration device, and a 20 mL syringe at the hub of the balloon guide catheter in the left internal carotid artery for approximately 2-1/2 minutes, the combination of the retrieval device, the microcatheter, and the Zoom aspiration catheter was retrieved and removed. Flow arrest was reversed in the left internal carotid artery. A control arteriogram performed through the balloon guide in the proximal left internal carotid artery demonstrated moderate spasm in the mid cervical left ICA. A 3 mm x 1 mm clot was noted entangled in the retrieval device. Wide patency was now noted of the previously occluded superior division inferior branch achieving a TICI 3 revascularization of the left middle cerebral artery distribution. The left anterior cerebral artery remained unchanged widely patent. Spasm noted at the distal end of the left internal carotid artery gradually resolved after 4 aliquots of 25 mcg of nitroglycerin intra-arterially. The balloon guide was then retrieved into the left common carotid artery. A final control arteriogram performed through the balloon guide catheter  demonstrated near complete resolution of the previous noted spasm in the left internal carotid artery distally. The left MCA and ACA distributions continued to show complete revascularization. The balloon guide was then removed. The 8 French sheath was removed with hemostasis achieved with manual compression for approximately 20 minutes. Distal pulses remained Dopplerable in both feet unchanged. A CT of the brain demonstrated no evidence of intracranial hemorrhages. Patient was left intubated waiting the results of the COVID-19 test. IMPRESSION: Status post endovascular complete revascularization of occluded prominent side branch of the superior division of the left middle cerebral artery with 1 pass with a 4 mm x 40 mm Solitaire X retrieval device and contact aspiration achieving a TICI 3 revascularization. PLAN: Follow-up as per referring MD. Electronically Signed   By: Julieanne Cotton M.D.   On: 01/29/2021 13:05   CT HEAD CODE STROKE WO CONTRAST  Result Date: 01/28/2021 CLINICAL DATA:  Right-sided weakness EXAM: CT ANGIOGRAPHY HEAD AND NECK CT PERFUSION BRAIN TECHNIQUE: Multidetector CT imaging of the head and neck was performed using the standard protocol during bolus administration of intravenous contrast. Multiplanar CT image reconstructions and MIPs were obtained to evaluate the vascular anatomy. Carotid stenosis measurements (when applicable) are obtained utilizing NASCET criteria, using the distal internal carotid diameter as the denominator. Multiphase CT imaging of the brain was performed following IV bolus contrast injection. Subsequent parametric perfusion maps were calculated using RAPID software. CONTRAST:  80 mL Omnipaque 350 COMPARISON:  Report of CTA from 2015 only. Images are unavailable at the time of dictation. FINDINGS: CT HEAD Brain: There is no acute intracranial hemorrhage or mass effect. There is hypoattenuation with loss of gray-white differentiation involving left insula and left  frontoparietal lobes (pre and postcentral gyri). There is likely a chronic small left frontal cortical infarct involving the precentral gyrus. Chronic right occipitotemporal infarct. There are age-indeterminate but probably chronic small vessel infarcts of basal ganglia and central white matter bilaterally. Additional patchy and confluent areas of low-attenuation in the supratentorial white matter nonspecific but may reflect moderate chronic microvascular ischemic changes. Vascular: No definite hyperdense vessel. There may be focal hyperdensity of distal left M2 branch within the sylvian fissure, which would correspond to CTA findings. Skull: Calvarium is unremarkable. Sinuses/Orbits: No acute finding. Other:  None. Review of the MIP images confirms the above findings ASPECTS [facility] Stroke Program Early CT Score) - Ganglionic level infarction (caudate, lentiform nuclei, internal capsule, insula, M1-M3 cortex): 5 - Supraganglionic infarction (M4-M6 cortex): 2 Total score (0-10 with 10 being normal): 7 CTA NECK Aortic arch: Great vessel origins are patent with mild calcified plaque. Right carotid system: Patent. Calcified plaque at the bifurcation and along the proximal internal carotid with less than 50% stenosis. Retropharyngeal course of the ICA. Left carotid system: Patent. Mixed plaque at the bifurcation and proximal internal carotid without stenosis. Retropharyngeal course of the ICA. Vertebral arteries: Patent and codominant.  No stenosis. Skeleton: Mild degenerative changes of the cervical spine. Other neck: Diffusely enlarged but homogeneous appearing thyroid. No ultrasound follow-up is recommended by current guidelines. Upper chest: Mild centrilobular emphysema. Review of the MIP images confirms the above findings CTA HEAD Anterior circulation: Intracranial internal carotid arteries are patent with calcified plaque along the cavernous and proximal supraclinoid portions. There is moderate to marked stenosis  of the proximal left cavernous segment. Additional areas of moderate stenosis bilaterally. Left M1 MCA is patent. There is distal left M2 branch occlusion within the posterior sylvian fissure. Right middle cerebral artery is patent. Anterior cerebral arteries are patent. Anterior communicating artery is present. Posterior circulation: Intracranial vertebral arteries are patent. Basilar artery is patent. Major cerebellar artery origins are patent. Left posterior cerebral artery is patent. There is significantly decreased flow within the right PCA beginning at the P2 segment probably related to prior infarct. Venous sinuses: Patent as allowed by contrast bolus timing. Review of the MIP images confirms the above findings CT Brain Perfusion Findings: CBF (<30%) Volume: 57mL Perfusion (Tmax>6.0s) volume: 30mL Mismatch Volume: 50mL Infarction Location: Left MCA territory IMPRESSION: No acute intracranial hemorrhage. Acute left MCA territory infarction with ASPECT score of 7. Distal left M2 MCA branch occlusion. Perfusion imaging demonstrates core infarction of 11 mL congruent with noncontrast CT findings. There is a calculated penumbra of 28 mL. Additional chronic findings detailed above including infarcts and microvascular ischemic changes. Plaque without hemodynamically significant stenosis at the ICA origins. Calcified plaque along the intracranial internal carotid arteries. Suspect moderate to marked stenosis of the proximal left cavernous segment. Additional areas of moderate stenosis bilaterally. Significantly decreased flow within the right PCA beginning at the P2 segment probably related to prior infarct. Emphysema. Mild enlargement of the main pulmonary artery, which could indicate pulmonary arterial hypertension. Initial results were communicated to Dr. Amada Jupiter at 12:27 pm on 01/28/2021 by text page via the [facility] messaging system. Electronically Signed   By: Guadlupe Spanish M.D.   On: 01/28/2021 12:44   CT  ANGIO HEAD NECK W WO CM W PERF (CODE STROKE)  Result Date: 01/28/2021 CLINICAL DATA:  Right-sided weakness EXAM: CT ANGIOGRAPHY HEAD AND NECK CT PERFUSION BRAIN TECHNIQUE: Multidetector CT imaging of the head and neck was performed using the standard protocol during bolus administration of intravenous contrast. Multiplanar CT image reconstructions and MIPs were obtained to evaluate the vascular anatomy. Carotid stenosis measurements (when applicable) are obtained utilizing NASCET criteria, using the distal internal carotid diameter as the denominator. Multiphase CT imaging of the brain was performed following IV bolus contrast injection. Subsequent parametric perfusion maps were calculated using RAPID software. CONTRAST:  80 mL Omnipaque 350 COMPARISON:  Report of CTA from 2015 only. Images are unavailable at the time of dictation. FINDINGS: CT HEAD Brain: There is no acute intracranial hemorrhage or mass effect. There is hypoattenuation with loss of  gray-white differentiation involving left insula and left frontoparietal lobes (pre and postcentral gyri). There is likely a chronic small left frontal cortical infarct involving the precentral gyrus. Chronic right occipitotemporal infarct. There are age-indeterminate but probably chronic small vessel infarcts of basal ganglia and central white matter bilaterally. Additional patchy and confluent areas of low-attenuation in the supratentorial white matter nonspecific but may reflect moderate chronic microvascular ischemic changes. Vascular: No definite hyperdense vessel. There may be focal hyperdensity of distal left M2 branch within the sylvian fissure, which would correspond to CTA findings. Skull: Calvarium is unremarkable. Sinuses/Orbits: No acute finding. Other: None. Review of the MIP images confirms the above findings ASPECTS [facility]([place] Stroke Program Early CT Score) - Ganglionic level infarction (caudate, lentiform nuclei, internal capsule, insula, M1-M3 cortex):  5 - Supraganglionic infarction (M4-M6 cortex): 2 Total score (0-10 with 10 being normal): 7 CTA NECK Aortic arch: Great vessel origins are patent with mild calcified plaque. Right carotid system: Patent. Calcified plaque at the bifurcation and along the proximal internal carotid with less than 50% stenosis. Retropharyngeal course of the ICA. Left carotid system: Patent. Mixed plaque at the bifurcation and proximal internal carotid without stenosis. Retropharyngeal course of the ICA. Vertebral arteries: Patent and codominant.  No stenosis. Skeleton: Mild degenerative changes of the cervical spine. Other neck: Diffusely enlarged but homogeneous appearing thyroid. No ultrasound follow-up is recommended by current guidelines. Upper chest: Mild centrilobular emphysema. Review of the MIP images confirms the above findings CTA HEAD Anterior circulation: Intracranial internal carotid arteries are patent with calcified plaque along the cavernous and proximal supraclinoid portions. There is moderate to marked stenosis of the proximal left cavernous segment. Additional areas of moderate stenosis bilaterally. Left M1 MCA is patent. There is distal left M2 branch occlusion within the posterior sylvian fissure. Right middle cerebral artery is patent. Anterior cerebral arteries are patent. Anterior communicating artery is present. Posterior circulation: Intracranial vertebral arteries are patent. Basilar artery is patent. Major cerebellar artery origins are patent. Left posterior cerebral artery is patent. There is significantly decreased flow within the right PCA beginning at the P2 segment probably related to prior infarct. Venous sinuses: Patent as allowed by contrast bolus timing. Review of the MIP images confirms the above findings CT Brain Perfusion Findings: CBF (<30%) Volume: 11mL Perfusion (Tmax>6.0s) volume: 39mL Mismatch Volume: 28mL Infarction Location: Left MCA territory IMPRESSION: No acute intracranial hemorrhage.  Acute left MCA territory infarction with ASPECT score of 7. Distal left M2 MCA branch occlusion. Perfusion imaging demonstrates core infarction of 11 mL congruent with noncontrast CT findings. There is a calculated penumbra of 28 mL. Additional chronic findings detailed above including infarcts and microvascular ischemic changes. Plaque without hemodynamically significant stenosis at the ICA origins. Calcified plaque along the intracranial internal carotid arteries. Suspect moderate to marked stenosis of the proximal left cavernous segment. Additional areas of moderate stenosis bilaterally. Significantly decreased flow within the right PCA beginning at the P2 segment probably related to prior infarct. Emphysema. Mild enlargement of the main pulmonary artery, which could indicate pulmonary arterial hypertension. Initial results were communicated to Dr. Amada JupiterKirkpatrick at 12:27 pm on 01/28/2021 by text page via the [facility] messaging system. Electronically Signed   By: Guadlupe SpanishPraneil  Patel M.D.   On: 01/28/2021 12:44    Labs:  CBC: Recent Labs    01/28/21 1210 01/28/21 1218 01/28/21 1718 01/29/21 0017  WBC 12.1*  --   --  9.8  HGB 15.3 16.7 16.0 14.3  HCT 44.5 49.0 47.0 43.0  PLT 249  --   --  236    COAGS: Recent Labs    01/28/21 1210  INR 1.1  APTT 24    BMP: Recent Labs    01/28/21 1210 01/28/21 1218 01/28/21 1718 01/29/21 0017  NA 142 143 140 139  K 3.9 3.8 3.9 4.2  CL 105 105  --  105  CO2 26  --   --  26  GLUCOSE 101* 96  --  123*  BUN 13 13  --  17  CALCIUM 9.1  --   --  8.8*  CREATININE 1.13 1.00  --  1.24  GFRNONAA >60  --   --  >60    LIVER FUNCTION TESTS: Recent Labs    01/28/21 1210  BILITOT 1.3*  AST 91*  ALT 40  ALKPHOS 46  PROT 6.5  ALBUMIN 3.7    Assessment and Plan:  Stroke - S/P cerebral intervention by Dr. Corliss Skains   Care per neurology. Will sign off.    Electronically Signed: Gwynneth Macleod, PA-C 01/30/2021, 3:45 PM    I spent a total of 15 Minutes  at the the patient's bedside AND on the patient's hospital floor or unit, greater than 50% of which was counseling/coordinating care for f/u stroke.

## 2021-01-30 NOTE — Progress Notes (Signed)
Physical Therapy Treatment Patient Details Name: Kenneth Yu MRN: [id number] DOB: 1956/01/23 Today's Date: 01/30/2021    History of Present Illness 65 yo male s/p R side weakness, aphasia and ?R hemianopsia.  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 PMHx: HTN, HLD.    PT Comments    Pt demonstrating progress towards his PT goals as he was able to perform multiple transfers and gait training bouts this date. Due to pt's significant R-sided weakness he requires + 2 physical assistance to steady him and facilitate weight shifting, R knee extension in stance, and R foot advancement with stepping. Pt is at high risk for falls and continues to have R visual field deficits, needing cues to be directed to turn his head and eyes to find his R side of his body with all functional mobility. Pt is very motivated to participate and improve. Will continue to follow acutely. Recommend follow-up with intensive inpatient rehab.     Follow Up Recommendations  CIR     Equipment Recommendations  Other (comment) (TBD next venue)    Recommendations for Other Services       Precautions / Restrictions Precautions Precautions: Fall Precaution Comments: SBP < 160 Restrictions Weight Bearing Restrictions: No    Mobility  Bed Mobility Overal bed mobility: Needs Assistance Bed Mobility: Supine to Sit     Supine to sit: Mod assist;+2 for safety/equipment     General bed mobility comments: modA for trunk and leg management, with pt initiating leg movement off EOB.    Transfers Overall transfer level: Needs assistance Equipment used: 2 person hand held assist Transfers: Sit to/from [employer] Sit to Stand: Mod assist;+2 physical assistance Stand pivot transfers: Mod assist;+2 physical assistance       General transfer comment: Pt needing modAx2 to power up to stand and steady with R knee block and bil UE support,  2x from recliner and 1x from EOB. Stand step bed > recliner with mod-maxAx2 to facilitate weight shift, block R knee, and advance R foot.  Ambulation/Gait Ambulation/Gait assistance: Max assist;Mod assist;+2 physical assistance Gait Distance (Feet): 2 Feet Assistive device: 2 person hand held assist Gait Pattern/deviations: Step-to pattern;Decreased step length - right;Decreased step length - left;Decreased stance time - right;Decreased weight shift to right;Decreased weight shift to left;Decreased stride length;Decreased dorsiflexion - right Gait velocity: reduced Gait velocity interpretation: <1.31 ft/sec, indicative of household ambulator General Gait Details: Pt with poor bil weight shift, avoiding shifting to R more than L as R knee would buckle and needed blocking. Due to decreased R stance time pt with poor L foot clearance and step length also. Mod-maxAx2 to block R knee during stance, advance R leg during step, facilitate bil weight shift, and steady. x3 gait bouts, 1x stepping laterally bed > recliner, 1x stepping anterior <> posterior with L leg, and 1x stepping anterior <> posterior with R leg   Stairs             Wheelchair Mobility    Modified Rankin (Stroke Patients Only) Modified Rankin (Stroke Patients Only) Pre-Morbid Rankin Score: No symptoms Modified Rankin: Moderately severe disability     Balance Overall balance assessment: Needs assistance Sitting-balance support: Feet supported;No upper extremity supported Sitting balance-Leahy Scale: Fair Sitting balance - Comments: able to sit statically EOB without external assist with supervision   Standing balance support: During functional activity;Bilateral upper extremity supported;Single extremity supported Standing balance-Leahy Scale: Poor Standing balance comment: reliant  on external support                            Cognition Arousal/Alertness: Awake/alert Behavior During Therapy: WFL for  tasks assessed/performed;Flat affect Overall Cognitive Status: Difficult to assess Area of Impairment: Attention;Following commands;Safety/judgement;Problem solving;Awareness                   Current Attention Level: Sustained   Following Commands: Follows one step commands inconsistently;Follows one step commands with increased time Safety/Judgement: Decreased awareness of deficits;Decreased awareness of safety Awareness: Emergent Problem Solving: Slow processing;Decreased initiation;Difficulty sequencing;Requires verbal cues;Requires tactile cues General Comments: Pt looking at L side when cued to look at R, needing directing to watch PT's hand or face to follow over to find his R side. Pt with difficulty sequencing steps with gait, needing max multi-modal cues and demonstration to do so. Pt attempting to state his name, getting "Wa" out but not remainder of name. Able to state "thank you" at end of session.      Exercises      General Comments General comments (skin integrity, edema, etc.): SpO2 as low as 84% on RA with rebound to 87-91% thus re-donned [place] to 2 L per RN request      Pertinent Vitals/Pain Pain Assessment: Faces Faces Pain Scale: No hurt Pain Intervention(s): Monitored during session    Home Living                      Prior Function            PT Goals (current goals can now be found in the care plan section) Acute Rehab PT Goals Patient Stated Goal: agreeable to session PT Goal Formulation: With patient Time For Goal Achievement: 02/12/21 Potential to Achieve Goals: Good Progress towards PT goals: Progressing toward goals    Frequency    Min 4X/week      PT Plan Current plan remains appropriate    Co-evaluation              AM-PAC PT "6 Clicks" Mobility   Outcome Measure  Help needed turning from your back to your side while in a flat bed without using bedrails?: A Lot Help needed moving from lying on your back to  sitting on the side of a flat bed without using bedrails?: A Lot Help needed moving to and from a bed to a chair (including a wheelchair)?: Total Help needed standing up from a chair using your arms (e.g., wheelchair or bedside chair)?: Total Help needed to walk in hospital room?: Total Help needed climbing 3-5 steps with a railing? : Total 6 Click Score: 8    End of Session Equipment Utilized During Treatment: Gait belt Activity Tolerance: Patient tolerated treatment well Patient left: with call bell/phone within reach;in chair;with chair alarm set Nurse Communication: Mobility status;Other (comment) (vitals) PT Visit Diagnosis: Other abnormalities of gait and mobility (R26.89);Muscle weakness (generalized) (M62.81);Hemiplegia and hemiparesis;Unsteadiness on feet (R26.81);Difficulty in walking, not elsewhere classified (R26.2);Other symptoms and signs involving the nervous system (R29.898) Hemiplegia - Right/Left: Right Hemiplegia - dominant/non-dominant: Dominant Hemiplegia - caused by: Cerebral infarction     Time: 1610-9604 PT Time Calculation (min) (ACUTE ONLY): 30 min  Charges:  $Gait Training: 8-22 mins $Therapeutic Activity: 8-22 mins                     Raymond Gurney, PT, DPT Acute Rehabilitation Services  Pager: [phone]  Office: [phone]    Henrene Dodge Pettis 01/30/2021, 1:03 PM

## 2021-01-30 NOTE — Evaluation (Addendum)
Clinical/Bedside Swallow Evaluation Patient Details  Name: Kenneth Yu MRN: [id number] Date of Birth: [birth year]  Today's Date: 01/30/2021 Time: SLP Start Time (ACUTE ONLY): 1255 SLP Stop Time (ACUTE ONLY): 1329    Past Medical History:  Past Medical History:  Diagnosis Date   DVT (deep venous thrombosis) (HCC)    Hyperlipidemia    Hypertension    Myocardial infarction [facility])    Past Surgical History:  Past Surgical History:  Procedure Laterality Date   IR CT HEAD LTD  01/28/2021   IR PERCUTANEOUS ART THROMBECTOMY/INFUSION INTRACRANIAL INC DIAG ANGIO  01/28/2021   RADIOLOGY WITH ANESTHESIA N/A 01/28/2021   Procedure: IR WITH ANESTHESIA -;  Surgeon: Julieanne Cotton, MD;  Location: [facility] OR;  Service: Radiology;  Laterality: N/A;   HPI:  65 yo male s/p R side weakness, aphasia and ?R hemianopsia.  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 PMHx: HTN, HLD.  SLP follow up for speech/language and swallowing indicated ,   Assessment / Plan / Recommendation Clinical Impression  Pt demonstrated significant right lateral sulci pocketing, labial residue, reduced manipulation and incomplete mastication with tougher textures on his tray. CN VII and CNXII involvement are significant on the right. His sensation and awareness of residue is reduced and has difficulty clearing without assist..He had 2 sudden short coughs during assessment and question penetration/aspiration. SLP will downgrade to Dys 1 (puree), continue thin, straws, crush pills. ST plans to follow up as he may need instrumental testing if continued s/s aspiration present. SLP Visit Diagnosis: Dysphagia, oral phase (R13.11)    Aspiration Risk  Mild aspiration risk;Moderate aspiration risk    Diet Recommendation Dysphagia 1 (Puree);Thin liquid   Liquid Administration via: Cup;Straw Medication Administration: Crushed with puree Supervision: Staff to assist with self  feeding;Full supervision/cueing for compensatory strategies Compensations: Minimize environmental distractions;Slow rate;Small sips/bites;Lingual sweep for clearance of pocketing Postural Changes: Seated upright at 90 degrees    Other  Recommendations Oral Care Recommendations: Oral care BID   Follow up Recommendations Inpatient Rehab      Frequency and Duration min 2x/week  2 weeks       Prognosis Prognosis for Safe Diet Advancement: Good      Swallow Study   General Date of Onset: 01/28/21 HPI: , Type of Study: Bedside Swallow Evaluation Previous Swallow Assessment: none Diet Prior to this Study: Dysphagia 3 (soft);Thin liquids Temperature Spikes Noted: No Respiratory Status: Nasal cannula History of Recent Intubation: No Behavior/Cognition: Alert;Cooperative;Pleasant mood;Requires cueing Oral Cavity Assessment: Within Functional Limits Oral Care Completed by SLP: No Oral Cavity - Dentition: Adequate natural dentition Vision: Functional for self-feeding Self-Feeding Abilities: Needs assist;Needs set up Patient Positioning: Upright in bed Baseline Vocal Quality: Normal Volitional Cough: Other (Comment);Weak Volitional Swallow:  (additional time)    Oral/Motor/Sensory Function Overall Oral Motor/Sensory Function: Moderate impairment Facial ROM: Reduced right;Suspected CN VII (facial) dysfunction Facial Symmetry: Abnormal symmetry right;Suspected CN VII (facial) dysfunction Facial Strength: Reduced right;Suspected CN VII (facial) dysfunction Facial Sensation: Reduced right;Suspected CN V (Trigeminal) dysfunction Lingual ROM: Reduced right;Suspected CN XII (hypoglossal) dysfunction Lingual Strength: Reduced;Suspected CN XII (hypoglossal) dysfunction   Ice Chips Ice chips: Not tested   Thin Liquid Thin Liquid: Within functional limits Presentation: Straw    Nectar Thick Nectar Thick Liquid: Not tested   Honey Thick Honey Thick Liquid: Not tested   Puree Puree: Impaired    Solid     Solid: Impaired Oral Phase Impairments: Reduced lingual movement/coordination;Impaired mastication Oral Phase  Functional Implications: Right anterior spillage;Right lateral sulci pocketing;Prolonged oral transit;Oral residue      Kenneth Yu 01/30/2021,4:08 PM  Kenneth Yu Kenneth Yu.Ed Nurse, children's [phone] Office [phone]

## 2021-01-30 NOTE — Evaluation (Addendum)
Speech Language Pathology Evaluation Patient Details Name: Kenneth Yu MRN: [id number] DOB: 25-Sep-1955 Today's Date: 01/30/2021 Time: 5397-6734 SLP Time Calculation (min) (ACUTE ONLY): 16 min  Problem List:  Patient Active Problem List   Diagnosis Date Noted   Stroke (cerebrum) (HCC) 01/28/2021   Middle cerebral artery embolism, left 01/28/2021   CAD (coronary artery disease) 10/28/2013   Acute CVA (cerebrovascular accident) (HCC) 10/27/2013   DVT (deep venous thrombosis) (HCC)    Myocardial infarction (HCC)    Hypertension    Hyperlipidemia    Past Medical History:  Past Medical History:  Diagnosis Date   DVT (deep venous thrombosis) (HCC)    Hyperlipidemia    Hypertension    Myocardial infarction [facility])    Past Surgical History:  Past Surgical History:  Procedure Laterality Date   IR CT HEAD LTD  01/28/2021   IR PERCUTANEOUS ART THROMBECTOMY/INFUSION INTRACRANIAL INC DIAG ANGIO  01/28/2021   RADIOLOGY WITH ANESTHESIA N/A 01/28/2021   Procedure: IR WITH ANESTHESIA -;  Surgeon: Julieanne Cotton, MD;  Location: [facility] OR;  Service: Radiology;  Laterality: N/A;   HPI:  65 yo male s/p R side weakness, aphasia and ?R hemianopsia.  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 PMHx: HTN, HLD.   ,   Assessment / Plan / Recommendation Clinical Impression  Pt's cognitive-communicative abilities will continue to be assessed in therapy. He exhibits a mix of expressive aphasia, apraxia and dysarthria. Receptive language for basic info was appropriate and suspect higher level comprehension may be more challenging. Motor speech deficits apparent with difficulty imitating correct placement of phonemes. Phonemic paraphasias during naming tasks intertwined with articulatory       imprecision. He could not accurately identify common objects. He followed one step commands accurately. Therapist will follow on acute and recommend CIR  therapies. Goals will be modified as expressive language is further evaluated.    SLP Assessment  SLP Recommendation/Assessment: Patient needs continued Speech Lanaguage Pathology Services SLP Visit Diagnosis: Dysarthria and anarthria (R47.1);Aphasia (R47.01);Cognitive communication deficit (R41.841)    Follow Up Recommendations  Inpatient Rehab    Frequency and Duration min 2x/week  2 weeks      SLP Evaluation Cognition  Overall Cognitive Status: Impaired/Different from baseline Arousal/Alertness: Awake/alert Orientation Level: Disoriented X4 Attention: Sustained Sustained Attention: Appears intact Memory:  (continue to assess) Awareness: Impaired Awareness Impairment: Anticipatory impairment;Emergent impairment Problem Solving: Impaired Problem Solving Impairment: Functional basic Safety/Judgment: Impaired       Comprehension  Auditory Comprehension Overall Auditory Comprehension:  (basic comprehension intact during assessment, will continue to assess for complex) Commands:  (one step intact) Visual Recognition/Discrimination Discrimination: Not tested Reading Comprehension Reading Status: Not tested    Expression Expression Primary Mode of Expression: Verbal Verbal Expression Overall Verbal Expression: Impaired Initiation: No impairment Level of Generative/Spontaneous Verbalization: Phrase Repetition: Impaired Level of Impairment: Word level Naming: Impairment Confrontation:  (10%) Verbal Errors: Phonemic paraphasias Pragmatics: No impairment Written Expression Dominant Hand: Right Written Expression:  (TBA)   Oral / Motor  Oral Motor/Sensory Function Overall Oral Motor/Sensory Function: Moderate impairment Facial ROM: Reduced right;Suspected CN VII (facial) dysfunction Facial Symmetry: Abnormal symmetry right;Suspected CN VII (facial) dysfunction Facial Strength: Reduced right;Suspected CN VII (facial) dysfunction Facial Sensation: Reduced right;Suspected  CN V (Trigeminal) dysfunction Lingual ROM: Reduced right;Suspected CN XII (hypoglossal) dysfunction Lingual Strength: Reduced;Suspected CN XII (hypoglossal) dysfunction Motor Speech Overall Motor Speech: Impaired Respiration: Within functional limits Phonation: Hoarse Resonance:  (question dysfunction) Articulation: Impaired  Level of Impairment: Word Intelligibility: Intelligibility reduced Word: 25-49% accurate Phrase: 0-24% accurate Motor Planning: Impaired   GO                    [name] 01/30/2021, 4:31 PM  [name] [name].Ed Nurse, children's [phone] Office [phone]

## 2021-01-31 DIAGNOSIS — I6602 Occlusion and stenosis of left middle cerebral artery: Secondary | ICD-10-CM | POA: Diagnosis not present

## 2021-01-31 LAB — CK: Total CK: 906 U/L — ABNORMAL HIGH (ref 49–397)

## 2021-01-31 NOTE — Progress Notes (Signed)
STROKE TEAM PROGRESS NOTE   SUBJECTIVE (INTERVAL HISTORY) Kenneth Yu is sitting up in a bedside chair.  He is awake alert and interactive.  Continues to have mild expressive aphasia with severe dysarthria but can barely be understood.  He still has significant right upper extremity plegia but is able to move the right leg slightly against gravity.  He has purposeful movements on the left.  Patient's insurance will not let him go to inpatient rehab at [facility] but will start looking for inpatient rehab facilities outside [facility] health.  Vital signs stable.  Neuro exam unchanged. OBJECTIVE Vitals:   01/30/21 2341 01/31/21 0404 01/31/21 0726 01/31/21 1222  BP: (!) 159/90 (!) 139/91 (!) 145/87   Pulse: 97 97 94 95  Resp: 19 19 20 20   Temp: 99.1 F (37.3 C) 98.5 F (36.9 C) 98.7 F (37.1 C) 97.8 F (36.6 C)  TempSrc: Oral Oral Oral Oral  SpO2: 97% 98% 96% 98%  Weight:      Height:        CBC:  Recent Labs  Lab 01/28/21 1210 01/28/21 1218 01/28/21 1718 01/29/21 0017  WBC 12.1*  --   --  9.8  NEUTROABS 10.3*  --   --  8.5*  HGB 15.3   < > 16.0 14.3  HCT 44.5   < > 47.0 43.0  MCV 85.4  --   --  86.7  PLT 249  --   --  236   < > = values in this interval not displayed.    Basic Metabolic Panel:  Recent Labs  Lab 01/28/21 1210 01/28/21 1218 01/28/21 1718 01/29/21 0017  NA 142 143 140 139  K 3.9 3.8 3.9 4.2  CL 105 105  --  105  CO2 26  --   --  26  GLUCOSE 101* 96  --  123*  BUN 13 13  --  17  CREATININE 1.13 1.00  --  1.24  CALCIUM 9.1  --   --  8.8*    Lipid Panel:  Recent Labs  Lab 01/29/21 0017  CHOL 172  TRIG 150*  HDL 48  CHOLHDL 3.6  VLDL 30  LDLCALC 94   HgbA1c:  Lab Results  Component Value Date   HGBA1C 5.9 (H) 01/29/2021    IMAGING  Results for orders placed or performed during the hospital encounter of 01/28/21  MR BRAIN WO CONTRAST   Narrative   CLINICAL DATA:  Right-sided weakness.  History of stroke  EXAM: MRI HEAD WITHOUT  CONTRAST  TECHNIQUE: Multiplanar, multiecho pulse sequences of the brain and surrounding structures were obtained without intravenous contrast.  COMPARISON:  CT and CTA from yesterday  FINDINGS: Brain: Large area of cortically based infarction in the lateral left frontal lobe, MCA branch distribution. Minimal patchy left caudate acute infarcts. Petechial hemorrhage is present.  Remote cortically based infarct affecting the right occipital lobe. Chronic lacunar infarcts in the bilateral deep gray nuclei. Chronic small right cerebellar infarct. Confluent chronic small vessel ischemic gliosis in the deep white matter. Brain volume is normal.  Chronic micro hemorrhages which are mainly peripheral but in areas that were likely affected by prior infarction.  No hydrocephalus, collection, or masslike finding.  Vascular: Preserved flow voids.  Skull and upper cervical spine: Normal marrow signal  Sinuses/Orbits: Nasopharyngeal and sinus opacification in the setting of intubation. Bilateral cataract resection.  IMPRESSION: 1. Large acute left MCA branch infarct with petechial hemorrhage. Minimal involvement in the left caudate head. 2. 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 Prior right PCA territory infarct.   Electronically Signed   By: Marnee Spring M.D.   On: 01/29/2021 05:29     PHYSICAL EXAM  General exam Pleasant middle-aged African-American male HEENT-  Normocephalic, no lesions, without obvious abnormality.  Normal external eye and conjunctiva.   Cardiovascular- regular rhythm, on tele, tachycardic   Lungs-clear to auscultation.  Abdomen- soft Musculoskeletal-no joint deformity; deep scarring on the medial aspect of the left mid-leg. Skin-warm and dry, well-perfused; redness on the left knee.  Neurologic Exam:  Mental Status: Alert, Able to follow commands.  Expressive aphasia speaks only occasional words and severe dysarthria.  Good  comprehension and follows most commands. Cranial Nerves: II:  No blink to threat on the right, no gaze preference.  III,IV, VI: ptosis not present, extra-ocular motions intact bilaterally V,VII: Face symmetric but difficult to assess 2/2 tube VIII: hearing normal bilaterally IX,X: Unable to assess XI: Unable to assess XII: midline tongue extension  Motor: Right : Upper extremity   2/5    Left:     Upper extremity   3/5  Lower extremity   1/5     Lower extremity   3/5 Cerebellar: Unable to assess Gait: Deferred   ASSESSMENT/PLAN Kenneth Yu is a 65 y.o. male with a past medical history significant for stroke, atrial fibrillation (INR 1.1), DVT, HTN, myocardial infarction, HLD who presented to [facility] after being found by his ex-wife with right-sided weakness and aphasia. On arrival, imaging showed a distal left M2 MCA branch occlusion with a large area of penumbra and the CT perfusion is favorable for intervention and was taken for emergent thrombectomy. More recent MR this morning showed a large acute left MCA branch infarct with petechial hemorrhage.  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 LDL 94; Add Lipitor 80mg  when cleared for PO intake HgbA1c 5.9 Continue 300mg  aspirin suppository until cleared for PO intake Ongoing aggressive stroke risk factor management PT/OT/SLP consults appreciated Plan for loop recorder  Hypertension 120s-150s/70s-90s Continue lisinopril, cleviprex Permissive hypertension (OK if < 220/120) but gradually normalize in 5-7 days Long-term BP goal normotensive  Hyperlipidemia LDL 94, goal < 70 Add Lipitor 80mg  daily when cleared for PO intake Continue statin at discharge  Hospital day # 3    Continue ongoing therapies.  Transfer to inpatient rehab when bed available over the next few days.  Loop recorder  placement for paroxysmal A. fib at discharge.  Continue mobilization out of bed and ongoing therapy consults.   Discussed with patient and .  Greater than 50% time during this 25-minute visit was spent on counseling and coordination of care about his stroke and discussion with care team and answering questions.  , MD Medical Director [facility] Pager: [phone] 01/31/2021 1:36 PM  To contact Stroke Continuity provider, please refer to [facility]. After hours, contact General Neurology

## 2021-01-31 NOTE — Progress Notes (Signed)
Physical Therapy Treatment Patient Details Name: Kenneth Yu MRN: [id number] DOB: [birth year] Today's Date: 01/31/2021    History of Present Illness Pt is a 65 yo male s/p R side weakness, aphasia and ?R hemianopsia.  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 PMHx: HTN, HLD.    PT Comments    Pt was very eager and motivated to participate throughout the session. He continues to require heavy physical assistance of two people for functional mobility secondary to significant R sided weakness. He remains an excellent candidate for further intensive therapy services in the Inpatient Rehab setting. PT will continue to follow-up with pt acutely to progress mobility as tolerated.     Follow Up Recommendations  CIR     Equipment Recommendations  Other (comment) (defer to next venue of care)    Recommendations for Other Services       Precautions / Restrictions Precautions Precautions: Fall Restrictions Weight Bearing Restrictions: No    Mobility  Bed Mobility Overal bed mobility: Needs Assistance Bed Mobility: Supine to Sit     Supine to sit: Mod assist;+2 for physical assistance     General bed mobility comments: pt very eager to move and initiated movement of trunk and LEs; mod A for movement of R LE off of bed and for full trunk elevation to an upright sitting position    Transfers Overall transfer level: Needs assistance Equipment used: 2 person hand held assist Transfers: Sit to/from [employer] Sit to Stand: Mod assist;+2 physical assistance Stand pivot transfers: Mod assist;+2 physical assistance       General transfer comment: pt continues to require physical assistance of two to power into standing with PT blocking R knee to prevent buckling in standing and with pivotal movement from bed to chair towards his L side  Ambulation/Gait                 Stairs              Wheelchair Mobility    Modified Rankin (Stroke Patients Only) Modified Rankin (Stroke Patients Only) Pre-Morbid Rankin Score: No symptoms Modified Rankin: Severe disability     Balance Overall balance assessment: Needs assistance Sitting-balance support: Feet supported;No upper extremity supported Sitting balance-Leahy Scale: Fair Sitting balance - Comments: able to sit statically EOB without external assist with supervision   Standing balance support: During functional activity;Bilateral upper extremity supported;Single extremity supported Standing balance-Leahy Scale: Poor Standing balance comment: reliant on external support                            Cognition Arousal/Alertness: Awake/alert Behavior During Therapy: Flat affect Overall Cognitive Status: Impaired/Different from baseline Area of Impairment: Attention;Following commands;Safety/judgement;Problem solving;Awareness                   Current Attention Level: Sustained   Following Commands: Follows one step commands inconsistently;Follows one step commands with increased time Safety/Judgement: Decreased awareness of deficits;Decreased awareness of safety Awareness: Emergent Problem Solving: Slow processing;Decreased initiation;Difficulty sequencing;Requires verbal cues;Requires tactile cues        Exercises General Exercises - Lower Extremity Ankle Circles/Pumps: AROM;Left;10 reps;Seated;Other (comment) (attempted on R, unable to perform) Long Arc Quad: AROM;Strengthening;Both;10 reps;Seated Hip Flexion/Marching: AROM;Strengthening;Both;10 reps;Seated    General Comments        Pertinent Vitals/Pain Pain Assessment: Faces Faces Pain Scale: Hurts even more Pain Location: R shoulder with mobility  Pain Descriptors / Indicators: Grimacing Pain Intervention(s): Monitored during session;Repositioned    Home Living                      Prior Function            PT Goals  (current goals can now be found in the care plan section) Acute Rehab PT Goals PT Goal Formulation: With patient Time For Goal Achievement: 02/12/21 Potential to Achieve Goals: Good Progress towards PT goals: Progressing toward goals    Frequency    Min 4X/week      PT Plan Current plan remains appropriate    Co-evaluation              AM-PAC PT "6 Clicks" Mobility   Outcome Measure  Help needed turning from your back to your side while in a flat bed without using bedrails?: A Lot Help needed moving from lying on your back to sitting on the side of a flat bed without using bedrails?: A Lot Help needed moving to and from a bed to a chair (including a wheelchair)?: A Lot Help needed standing up from a chair using your arms (e.g., wheelchair or bedside chair)?: A Lot Help needed to walk in hospital room?: Total Help needed climbing 3-5 steps with a railing? : Total 6 Click Score: 10    End of Session Equipment Utilized During Treatment: Gait belt Activity Tolerance: Patient tolerated treatment well Patient left: in chair;with call bell/phone within reach;with chair alarm set Nurse Communication: Mobility status PT Visit Diagnosis: Other abnormalities of gait and mobility (R26.89);Muscle weakness (generalized) (M62.81);Hemiplegia and hemiparesis;Unsteadiness on feet (R26.81);Difficulty in walking, not elsewhere classified (R26.2);Other symptoms and signs involving the nervous system (R29.898) Hemiplegia - Right/Left: Right Hemiplegia - dominant/non-dominant: Dominant Hemiplegia - caused by: Cerebral infarction     Time: 1122-1140 PT Time Calculation (min) (ACUTE ONLY): 18 min  Charges:  $Therapeutic Activity: 8-22 mins                     Arletta Bale, DPT  Acute Rehabilitation Services Office [phone]    Alessandra Bevels Khalidah Herbold 01/31/2021, 12:50 PM

## 2021-01-31 NOTE — TOC Initial Note (Addendum)
Transition of Care [facility]) - Initial/Assessment Note    Patient Details  Name: Kenneth Yu MRN: [id number] Date of Birth: Apr 10, 1956  Transition of Care [facility]) CM/SW Contact:    Pollie Friar, RN Phone Number: 01/31/2021, 10:27 AM  Clinical Narrative:                 CM noted in CIR note that patients insurance is not in network. CM met with the patient and he nodded in agreement with having his information sent to other IR in the area. He agreed to start with [facility] and then [facility] if [facility] unable to offer.  CM has faxed his information to [facility] as there admissions person states they are in network with pts insurance.  TOC following.   1555: pt doesn't have 24 hour support after a rehab stay. [facility] feels he is going to need this support and thus are not going to offer a bed. CM spoke to the patient and his daughter (with pt permission) about SNF rehab, they are in agreement. CM has faxed pt out in the [facility] area and in areas near [facility] per daughter request.  TOC following.   Expected Discharge Plan: IP Rehab Facility Barriers to Discharge: Continued Medical Work up   Patient Goals and CMS Choice   CMS Medicare.gov Compare Post Acute Care list provided to:: Patient Choice offered to / list presented to : Patient  Expected Discharge Plan and Services Expected Discharge Plan: [place]   Discharge Planning Services: CM Consult Post Acute Care Choice: IP Rehab Living arrangements for the past 2 months: Apartment                                      Prior Living Arrangements/Services Living arrangements for the past 2 months: Apartment Lives with:: Self Patient language and need for interpreter reviewed:: Yes Do you feel safe going back to the place where you live?: Yes      Need for Family Participation in Patient Care: Yes (Comment) Care giver support system in place?: No (comment)   Criminal Activity/Legal Involvement Pertinent to Current  Situation/Hospitalization: No - Comment as needed  Activities of Daily Living   ADL Screening (condition at time of admission) Patient's cognitive ability adequate to safely complete daily activities?: No Is the patient deaf or have difficulty hearing?: No Does the patient have difficulty seeing, even when wearing glasses/contacts?: No Does the patient have difficulty concentrating, remembering, or making decisions?: Yes Patient able to express need for assistance with ADLs?: No Does the patient have difficulty dressing or bathing?: Yes Independently performs ADLs?: No Communication: Dependent Is this a change from baseline?: Change from baseline, expected to last >3 days Dressing (OT): Dependent Is this a change from baseline?: Change from baseline, expected to last >3 days Grooming: Dependent Is this a change from baseline?: Change from baseline, expected to last >3 days Feeding: Dependent Is this a change from baseline?: Change from baseline, expected to last >3 days Bathing: Dependent Is this a change from baseline?: Change from baseline, expected to last >3 days Toileting: Dependent Is this a change from baseline?: Change from baseline, expected to last >3days In/Out Bed: Dependent Is this a change from baseline?: Change from baseline, expected to last >3 days Walks in Home: Dependent Is this a change from baseline?: Change from baseline, expected to last >3 days Does the patient  have difficulty walking or climbing stairs?: Yes Weakness of Legs: Right Weakness of Arms/Hands: Right  Permission Sought/Granted                  Emotional Assessment Appearance:: Appears stated age Attitude/Demeanor/Rapport: Other (comment) (unable to speak--nods yes and no appropriatley) Affect (typically observed): Accepting Orientation: : Oriented to Self, Oriented to Situation, Oriented to Place   Psych Involvement: No (comment)  Admission diagnosis:  Stroke (cerebrum) ([place])  [I63.9] Middle cerebral artery embolism, left [I66.02] Patient Active Problem List   Diagnosis Date Noted   Stroke (cerebrum) ([place]) 01/28/2021   Middle cerebral artery embolism, left 01/28/2021   CAD (coronary artery disease) 10/28/2013   Acute CVA (cerebrovascular accident) ([place]) 10/27/2013   DVT (deep venous thrombosis) ([place])    Myocardial infarction ([place])    Hypertension    Hyperlipidemia    PCP:  Default, Provider, MD Pharmacy:   [facility] [address] [place] [address] Phone: [phone] Fax: [phone]     Social Determinants of Health (SDOH) Interventions    Readmission Risk Interventions No flowsheet data found.

## 2021-02-01 ENCOUNTER — Inpatient Hospital Stay (HOSPITAL_COMMUNITY): Payer: Medicare (Managed Care)

## 2021-02-01 DIAGNOSIS — Z8673 Personal history of transient ischemic attack (TIA), and cerebral infarction without residual deficits: Secondary | ICD-10-CM | POA: Diagnosis not present

## 2021-02-01 DIAGNOSIS — I10 Essential (primary) hypertension: Secondary | ICD-10-CM

## 2021-02-01 DIAGNOSIS — E7800 Pure hypercholesterolemia, unspecified: Secondary | ICD-10-CM | POA: Diagnosis not present

## 2021-02-01 DIAGNOSIS — I6602 Occlusion and stenosis of left middle cerebral artery: Secondary | ICD-10-CM | POA: Diagnosis not present

## 2021-02-01 DIAGNOSIS — Z86718 Personal history of other venous thrombosis and embolism: Secondary | ICD-10-CM

## 2021-02-01 MED ORDER — LISINOPRIL 20 MG PO TABS
20.0000 mg | ORAL_TABLET | Freq: Two times a day (BID) | ORAL | Status: DC
  Administered 2021-02-01 – 2021-02-08 (×15): 20 mg via ORAL
  Filled 2021-02-01 (×15): qty 1

## 2021-02-01 MED ORDER — ATORVASTATIN CALCIUM 40 MG PO TABS
40.0000 mg | ORAL_TABLET | Freq: Every day | ORAL | Status: DC
  Administered 2021-02-01 – 2021-02-08 (×8): 40 mg via ORAL
  Filled 2021-02-01 (×8): qty 1

## 2021-02-01 MED ORDER — AMLODIPINE BESYLATE 10 MG PO TABS
10.0000 mg | ORAL_TABLET | Freq: Every day | ORAL | Status: DC
  Administered 2021-02-01 – 2021-02-08 (×8): 10 mg via ORAL
  Filled 2021-02-01 (×8): qty 1

## 2021-02-01 MED ORDER — BUTALBITAL-APAP-CAFFEINE 50-325-40 MG PO TABS
1.0000 | ORAL_TABLET | Freq: Two times a day (BID) | ORAL | Status: DC | PRN
  Administered 2021-02-02 – 2021-02-04 (×2): 1 via ORAL
  Filled 2021-02-01 (×2): qty 1

## 2021-02-01 NOTE — Plan of Care (Signed)
  Problem: Clinical Measurements: Goal: Ability to maintain clinical measurements within normal limits will improve Outcome: Progressing Goal: Will remain free from infection Outcome: Progressing Goal: Diagnostic test results will improve Outcome: Progressing Goal: Respiratory complications will improve Outcome: Progressing Goal: Cardiovascular complication will be avoided Outcome: Progressing   Problem: Ischemic Stroke/TIA Tissue Perfusion: Goal: Complications of ischemic stroke/TIA will be minimized Outcome: Progressing   

## 2021-02-01 NOTE — Progress Notes (Signed)
Late note from 01/31/21  Speech Language Pathology Treatment:    Patient Details Name: Kenneth Yu MRN: [id number] DOB: Jul 30, 1955 Today's Date: 02/01/2021 Time:  -     Assessment / Plan / Recommendation Clinical Impression    Pt seen for apraxia and dysphagia. Texture downgraded to puree yesterday due to significant pocketing. Observed with puree items needing mod cues to sense and remove residue in right buccal sulci - residue is significantly less than with Dys 3. He did not cough with thin or puree today and recommended he continue this diet.  Apraxia more evident this am and pt has significant difficulty imitating labial and lingual placement even with visual and tactile cues. He attempted to write using left hand and larger marker then pen with some letters legible of name. He identified pictures on communication board 100% accurately given semantic description. Encouraged use of board for wants needs with staff.     HPI    65 yo male s/p R side weakness, aphasia and ?R hemianopsia.  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 PMHx: HTN, HLD        SLP Plan           Recommendations:  inpt rehab                           GO                [name] 02/01/2021, 4:32 PM

## 2021-02-01 NOTE — Progress Notes (Signed)
  Speech Language Pathology Treatment: Dysphagia  Patient Details Name: Kenneth Yu MRN: [id number] DOB: Jul 24, 1956 Today's Date: 02/01/2021 Time: 7001-7494 SLP Time Calculation (min) (ACUTE ONLY): 30 min  Assessment / Plan / Recommendation Clinical Impression  Skilled SLP this am for dysphagia - as meal tray located at bedside. SLP facilitated intake with helping to reposition pt in bed, setting up his tray and providing cues for dysphagia compensations.   Pt self feeding with his left hand *right hand hemiparesis- right handed prior to CVA* with resultant significant oral pocketing on right, labial retention on right without awareness.  SLP provided verbal and visual feedback via mirror on tray table within his view during his meal.  Pt admits to some coughing with intake - mostly liquids - but reports improvement in swallowing today. Pt consumed pureed items including grits, eggs and waffles.  Max cues initially for oral clearance fading to mod toward end of meal.  Conducted 3 ounce Yale water challenge via straw with minimal right labial spillage however he easily passed screen today.  Reviewed swallow precaution sign with pt and he demonstrated oral clearance x2.  Pt is making progress in his swallowing - but do not recommend to advance solids due to level of oral pocketing.  Intermittent supervision advised - most notably after meals to assure no pocketing present to prevent aspirates of solids in right buccal region. Will continue to follow for dysphagia.     HPI HPI: 65 yo male s/p R side weakness, aphasia and ?R hemianopsia.  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 PMHx: HTN, HLD.  SLP follow up for speech/language and swallowing indicated.      SLP Plan  Continue with current plan of care       Recommendations  Diet recommendations: Thin liquid;Dysphagia 1 (puree) Liquids provided via: Straw Medication  Administration: Crushed with puree Supervision: Intermittent supervision to cue for compensatory strategies;Patient able to self feed Compensations: Minimize environmental distractions;Slow rate;Small sips/bites;Lingual sweep for clearance of pocketing (finger or spoon sweep due to pt's decreased lingual strength - please check after meal completion and allow pt to use oral suction if indicated) Postural Changes and/or Swallow Maneuvers: Seated upright 90 degrees;Upright 30-60 min after meal                Oral Care Recommendations: Oral care BID Follow up Recommendations: Inpatient Rehab SLP Visit Diagnosis: Dysarthria and anarthria (R47.1);Aphasia (R47.01);Cognitive communication deficit (W96.759) Plan: Continue with current plan of care       GO                Kenneth Yu 02/01/2021, 9:29 AM

## 2021-02-01 NOTE — TOC Progression Note (Addendum)
Transition of Care [facility]) - Progression Note    Patient Details  Name: Kenneth Yu MRN: [id number] Date of Birth: Sep 04, 1955  Transition of Care [facility]) CM/SW Contact  Kermit Balo, RN Phone Number: 02/01/2021, 1:10 PM  Clinical Narrative:    CM has called daughter twice today to provide choice for SNF rehab. Voicemail left x 1. TOC following.  1355: Daughter selected [facility] for rehab. Kim at [facility] has offered a bed and will begin insurance auth. Covid ordered for Sunday for Monday d/c.  TOC following.   Expected Discharge Plan: IP Rehab Facility Barriers to Discharge: Continued Medical Work up  Expected Discharge Plan and Services Expected Discharge Plan: IP Rehab Facility   Discharge Planning Services: CM Consult Post Acute Care Choice: IP Rehab Living arrangements for the past 2 months: Apartment                                       Social Determinants of Health (SDOH) Interventions    Readmission Risk Interventions No flowsheet data found.

## 2021-02-01 NOTE — Progress Notes (Addendum)
STROKE TEAM PROGRESS NOTE   SUBJECTIVE (INTERVAL HISTORY) Kenneth Yu is sitting up in a bedside chair.  He is awake alert and interactive.  Continues to have mild expressive aphasia with severe dysarthria but can barely be understood.  He still has significant right upper extremity plegia but is able to move the right leg slightly against gravity.  He has purposeful movements on the left.  Patient's insurance will not let him go to inpatient rehab at [facility] but will start looking for inpatient rehab facilities outside [facility].  Vital signs stable.  Neuro exam unchanged. OBJECTIVE Vitals:   01/31/21 1944 01/31/21 2347 02/01/21 0324 02/01/21 0732  BP: (!) 144/96 (!) 161/96 (!) 146/95 139/87  Pulse: 96 94 86 84  Resp: 16 17 18 20   Temp: 99.2 F (37.3 C) 99.1 F (37.3 C) 99 F (37.2 C) (!) 97.5 F (36.4 C)  TempSrc: Oral Oral Oral Oral  SpO2: 98% 99% 98% 99%  Weight:      Height:        CBC:  Recent Labs  Lab 01/28/21 1210 01/28/21 1218 01/28/21 1718 01/29/21 0017  WBC 12.1*  --   --  9.8  NEUTROABS 10.3*  --   --  8.5*  HGB 15.3   < > 16.0 14.3  HCT 44.5   < > 47.0 43.0  MCV 85.4  --   --  86.7  PLT 249  --   --  236   < > = values in this interval not displayed.    Basic Metabolic Panel:  Recent Labs  Lab 01/28/21 1210 01/28/21 1218 01/28/21 1718 01/29/21 0017  NA 142 143 140 139  K 3.9 3.8 3.9 4.2  CL 105 105  --  105  CO2 26  --   --  26  GLUCOSE 101* 96  --  123*  BUN 13 13  --  17  CREATININE 1.13 1.00  --  1.24  CALCIUM 9.1  --   --  8.8*    Lipid Panel:  Recent Labs  Lab 01/29/21 0017  CHOL 172  TRIG 150*  HDL 48  CHOLHDL 3.6  VLDL 30  LDLCALC 94   HgbA1c:  Lab Results  Component Value Date   HGBA1C 5.9 (H) 01/29/2021    IMAGING  Results for orders placed or performed during the hospital encounter of 01/28/21  MR BRAIN WO CONTRAST   Narrative   CLINICAL DATA:  Right-sided weakness.  History of stroke  EXAM: MRI HEAD WITHOUT  CONTRAST  TECHNIQUE: Multiplanar, multiecho pulse sequences of the brain and surrounding structures were obtained without intravenous contrast.  COMPARISON:  CT and CTA from yesterday  FINDINGS: Brain: Large area of cortically based infarction in the lateral left frontal lobe, MCA branch distribution. Minimal patchy left caudate acute infarcts. Petechial hemorrhage is present.  Remote cortically based infarct affecting the right occipital lobe. Chronic lacunar infarcts in the bilateral deep gray nuclei. Chronic small right cerebellar infarct. Confluent chronic small vessel ischemic gliosis in the deep white matter. Brain volume is normal.  Chronic micro hemorrhages which are mainly peripheral but in areas that were likely affected by prior infarction.  No hydrocephalus, collection, or masslike finding.  Vascular: Preserved flow voids.  Skull and upper cervical spine: Normal marrow signal  Sinuses/Orbits: Nasopharyngeal and sinus opacification in the setting of intubation. Bilateral cataract resection.  IMPRESSION: 1. Large acute left MCA branch infarct with petechial hemorrhage. Minimal involvement in the left caudate head. 2. Chronic  small vessel disease with chronic lacunar infarcts. Prior right PCA territory infarct.   Electronically Signed   By: Marnee Spring M.D.   On: 01/29/2021 05:29     PHYSICAL EXAM  General exam Pleasant middle-aged African-American male HEENT-  Normocephalic, no lesions, without obvious abnormality.  Normal external eye and conjunctiva.   Cardiovascular- regular rhythm, on tele, tachycardic   Lungs-clear to auscultation.  Abdomen- soft Musculoskeletal-no joint deformity; deep scarring on the medial aspect of the left mid-leg. Skin-warm and dry, well-perfused; redness on the left knee.  Neurologic Exam:  Mental Status: Alert, Able to follow commands.  Expressive aphasia speaks only occasional words and severe dysarthria.  Good  comprehension and follows most commands. Cranial Nerves: II:  No blink to threat on the right, left gaze preference, but able to cross midline to right III,IV, VI: ptosis not present, extra-ocular motions intact bilaterally V,VII: Face asymmetric , right facial droop VIII: hearing normal bilaterally IX,X: Unable to assess XI: Unable to assess XII: midline tongue extension  Motor: Right : Upper extremity   2/5  Left:     Upper extremity   4/5 Hip Flexion   4/5  5/5 Knee Flexion   4/5  5/5 Knee Extension  2/5  5/5 Ankle Dorsiflexion  1/5  5/5 Ankle Plantarflexion  1/5  5/5  Cerebellar: Unable to assess Gait: Deferred   ASSESSMENT/PLAN Kenneth Yu is a 65 y.o. male with a past medical history significant for previous stroke, DVT on Xarelto, HTN, CAD/MI, HLD who presented to [facility] after being found by his ex-wife with right-sided weakness and aphasia. On arrival, imaging showed a distal left M2 MCA branch occlusion with a large area of penumbra and the CT perfusion is favorable for intervention and was taken for emergent thrombectomy. More recent MR this morning showed a large acute left MCA branch infarct with petechial hemorrhage.  # Stroke: Left MCA infarct due to left M2 occlusion s/p emergent thrombectomy with TICI3, etiology cryptogenic CT head acute left MCA territory infarct with aspect score 7 CTA head and neck left M2 MCA branch occlusion CTP core infarct 11 cc, penumbra 28 cc Status post IR with TICI3 reperfusion MRI head Large acute left MCA branch infarct with petechial hemorrhage CT head 7/6 showed left MCA infarct with mild hemorrhagic conversion Repeat CT head 7/8 slightly increased left MCA hemorrhagic conversion 2D Echo EF 50 to 55% Plan for loop recorder before DC to rule out A. fib LDL 94  HgbA1c 5.9 Was on aspirin 81, currently on hold given hemorrhagic conversion with slightly increased hemorrhagic conversion and severe headache Ongoing aggressive stroke  risk factor management Therapy recommendations SNF Disposition pending  History of stroke 09/2013 right temporal and occipital stroke, started on Xarelto at that time per chart (10/27/13 per Dr. 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 Pearlean Brownie 01/30/21 note)  Hypertension 120s-150s/70s-90s Continue lisinopril,  Permissive hypertension (OK if < 220/120) but gradually normalize in 5-7 days Long-term BP goal normotensive  Hyperlipidemia LDL 94, goal < 70 Add Lipitor 40mg  daily Continue statin at discharge  Dysphagia  Passed swallow on dys 1 and thin Speech on board Avoid aspiration  Hospital day # 4   , MSN, NP-C [facility] [phone]  ATTENDING NOTE: I reviewed above note and agree with the assessment and plan. Pt was seen and  examined.   65 year old male with history of stroke, DVT, hypertension, CAD/MI, HLD admitted for right-sided weakness and aphasia.  CT showed left MCA territory infarct, CTA head and neck right M2 occlusion, status post IR with TICI 3 reperfusion.  MRI showed large left MCA infarct with petechial hemorrhage.  LDL 94, A1c 5.9, EF 50 to 60%.  On exam, patient lying in bed, no family at bedside.  Expressive aphasia, only able to make sound but not sentences.  Follows simple commands, comprehension seems intact.  No gaze palsy, tracking bilaterally, visual field full, right facial droop, tongue protrusion to the right.  Right upper extremity is flaccid, right lower extremity 2/5.  Left upper and lower extremity 5/5.  Sensation decreased on the right.  Left finger-to-nose intact.  Gait not tested.  Per RN and speech therapies, patient this morning had acute onset left forehead headache, CT repeat showed a slight increase of hemorrhagic conversion on the left.  However, patient later has no recollection of the  headache and CT repeat.  We will do EEG to rule out a seizure episode.  Patient holding left forehead indicating headache, responding to Tylenol this morning but not lasting long.  We will order Fioricet.  Repeat CT showed slight increase of hemorrhagic conversion, will hold off aspirin 81 for now.  Patient also has right arm and leg pain due to lack of movement, encouraged him to do more self exercise.  Etiology for patient stroke not quite clear, however, patient does have numerous stroke risk factors.  Recommend loop recorder prior to discharge to rule out A. fib.  Plan to repeat CT in 2 days, if stable, resume aspirin 81.  Add lipitor 40 for HLD.  BP control goal less than 160, continue amlodipine and lisinopril.  PT/OT recommend SNF.  For detailed assessment and plan, please refer to above as I have made changes wherever appropriate.   Marvel Plan, MD PhD Stroke Neurology 02/01/2021 1:55 PM  I spent  35 minutes in total face-to-face time with the patient, more than 50% of which was spent in counseling and coordination of care, reviewing test results, images and medication, and discussing the diagnosis, treatment plan and potential prognosis. This patient's care requiresreview of multiple databases, neurological assessment, discussion with family, other specialists and medical decision making of high complexity.      To contact Stroke Continuity provider, please refer to [email or web address]. After hours, contact General Neurology

## 2021-02-01 NOTE — Progress Notes (Addendum)
Speech Language Pathology Treatment: Cognitive-Linquistic  Patient Details Name: Kenneth Yu MRN: [id number] DOB: 07-09-1956 Today's Date: 02/01/2021 Time: 0820-0840 SLP Time Calculation (min) (ACUTE ONLY): 20 min  Assessment / Plan / Recommendation Clinical Impression  Pt seen for session focused on expressive communication via use of communication board use, writing and yes/no questions.  Pt attempting to articulate - initially stating "I am"... changed to "I want" appropriately but beyond this was unintelligible.  Cues to write word trying to express was not effective - pt wrote "In thtzit" and acknowledged this was not correct.  Use of communication board initiated with pt being able to spell "mom" with choices restricted to single line on board.  He attempted spell out daughter - able to get "da" - but limited by need for decreased amount of choices.  Via yes/no questions able to understand he wanted to see his daughter and desired SLP to call her. Pt is great at gesturing for communication and has excellent understanding of information. Pt able to identify his daughter's name from choice of 3.   Toward end of session, pt winced and reached to his right thigh and indicated extreme pain.  SLP alert RN and provided pt with mustard to attempt to alleviate.  RN and SLP In room with pt - pt reporting decreased discomfort.  However upon discussion with RN re: pt's communication, he suddenly started holding left side of his head *temporal area" and crying.  RN pulled the emergency button and other staff arrived to assist.  This sudden pain continued and SLP reached out to MD via secure chat - as did the RN.  Upon returning to pt's room, he was noted to have slower and shallow respirations - requiring tactile cues to stay alert.  Dyna-mat obtained to check pt's oxygen saturation which was in the 90's as well at BP.  Within a few minutes, pt appeared with near syncope - requiring moderate tactile/verbal  cues to stay alert.  RN in touch with MD re: pt's needs, changes and pt was taken for STAT CT of his head by 2 RNs.   Will follow up for dysphagia, expressive motor speech and language deficits.  Pt was making great progress today - prior to sudden change.  May need re-evaluation dependent on findings of CT.    Quick follow up with pt regarding his current status.  Of note, he has NO RECALL of event of severe pain occurring nor having undergone a CT of his head this am.  ? Source???  SLP spoke to daughter on this phone *in the pt's room*, to request she come by to see him per pt.  Advised daughter to pt's good understanding of language and continued efforts via yes/no, communication board for expression.  Daughter advised she will come see pt on her way to work today - Charity fundraiser and pt informed.    HPI HPI: 65 yo male s/p R side weakness, aphasia and ?R hemianopsia.  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 PMHx: HTN, HLD.  SLP follow up for speech/language and swallowing indicated.      SLP Plan  Continue with current plan of care       Recommendations  Diet recommendations: Thin liquid;Dysphagia 1 (puree) Liquids provided via: Straw Medication Administration: Crushed with puree Supervision: Intermittent supervision to cue for compensatory strategies;Patient able to self feed Compensations: Minimize environmental distractions (finger or spoon sweep due to pt's decreased lingual strength -  please check after meal completion and allow pt to use oral suction if indicated) Postural Changes and/or Swallow Maneuvers: Seated upright 90 degrees;Upright 30-60 min after meal                Oral Care Recommendations: Oral care BID Follow up Recommendations: Inpatient Rehab SLP Visit Diagnosis: Aphasia (R47.01);Cognitive communication deficit (R41.841) Plan: Continue with current plan of care       GO               Rolena Infante,  MS [facility] SLP Acute Rehab Services Office [phone] Pager [phone]  [name] 02/01/2021, 9:47 AM

## 2021-02-01 NOTE — Progress Notes (Signed)
PT Cancellation Note  Patient Details Name: Kenneth Yu MRN: [id number] DOB: 1955-09-16   Cancelled Treatment:    Reason Eval/Treat Not Completed: Medical issues which prohibited therapy. RN requesting pt be restricted to bed level activity at this time secondary to an event this morning (severe headache on R and ?syncopal or near syncopal episode with SLP). OT just working with pt and PT will follow-up again with pt another date as available and appropriate.     Alessandra Bevels Daisa Stennis 02/01/2021, 12:29 PM

## 2021-02-01 NOTE — Progress Notes (Signed)
EEG performed. Results pending.

## 2021-02-01 NOTE — Progress Notes (Signed)
Occupational Therapy Treatment Patient Details Name: Kenneth Yu MRN: [id number] DOB: 08/19/1955 Today's Date: 02/01/2021    History of present illness Pt is a 65 yo male s/p R side weakness, aphasia and ?R hemianopsia.  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 PMHx: HTN, HLD.  7/8 Repeate CT: Evolving recent left MCA territory infarction.  Also having runs of V-Tach.   OT comments  Patient with possible syncopal episode this morning.  Repeat CT completed and details are charted.  RN restricted out of bed due to additional runs of V-tach.  Gentle ROM to R upper extremity with pain noted to R wrist.  Self ROM with L upper extremity help for now.  +2 Max A to slide higher in the bed and for repositioning.  Min a for oral swab for thoroughness.  Patient's R arm elevated on pillow for comfort.  Inpatient rehab continues to be recommended given patient's prior level of function and capacity to participate with higher intensity rehab.  OT will continue to follow acutely.    Follow Up Recommendations  CIR    Equipment Recommendations  3 in 1 bedside commode;Wheelchair (measurements OT);Wheelchair cushion (measurements OT);Hospital bed    Recommendations for Other Services      Precautions / Restrictions Precautions Precautions: Fall Precaution Comments: V-Tach on 7/8 with repeate CT scan.  RN restricted to bed level this date. Restrictions Weight Bearing Restrictions: No       Mobility Bed Mobility               General bed mobility comments: +2 Max A to slide further up in bed.    Transfers                 General transfer comment: RN restricted OOB this date.    Balance                                           ADL either performed or assessed with clinical judgement   ADL       Grooming: Oral care;Minimal assistance bed level                                                          Cognition  No changes from prior session                                              Exercises     Shoulder Instructions       General Comments      Pertinent Vitals/ Pain       Pain Assessment: Faces Faces Pain Scale: Hurts even more Pain Location: R wrist with touch or ROM Pain Descriptors / Indicators: Grimacing;Guarding;Moaning Pain Intervention(s): Monitored during session;Other (comment) (positioned on a pillow for comfort and elevation)  Frequency  Min 2X/week        Progress Toward Goals  OT Goals(current goals can now be found in the care plan section)  Progress towards OT goals: Not progressing toward goals - comment (V-Tach restricted to bed level)  Acute Rehab OT Goals OT Goal Formulation: With patient Time For Goal Achievement: 02/13/21 Potential to Achieve Goals: Fair  Plan Discharge plan remains appropriate    Co-evaluation                 AM-PAC OT "6 Clicks" Daily Activity     Outcome Measure   Help from another person eating meals?: A Little Help from another person taking care of personal grooming?: A Little Help from another person toileting, which includes using toliet, bedpan, or urinal?: A Lot Help from another person bathing (including washing, rinsing, drying)?: A Lot Help from another person to put on and taking off regular upper body clothing?: A Lot Help from another person to put on and taking off regular lower body clothing?: A Lot 6 Click Score: 14    End of Session Equipment Utilized During Treatment: Oxygen  OT Visit Diagnosis: Unsteadiness on feet (R26.81);Muscle weakness (generalized) (M62.81);Hemiplegia and hemiparesis;Other symptoms and signs involving cognitive function Hemiplegia - Right/Left: Right Hemiplegia - dominant/non-dominant: Dominant Hemiplegia - caused by: Cerebral  infarction   Activity Tolerance Patient tolerated treatment well   Patient Left in bed;with call bell/phone within reach;with bed alarm set   Nurse Communication          Time: 6734-1937 OT Time Calculation (min): 16 min  Charges: OT General Charges $OT Visit: 1 Visit OT Treatments $Self Care/Home Management : 8-22 mins  02/01/2021  Kenneth Yu, OTR/L  Acute Rehabilitation Services  Office:  [phone]    Kenneth Yu 02/01/2021, 12:39 PM

## 2021-02-01 NOTE — Plan of Care (Signed)
  Problem: Education: Goal: Knowledge of disease or condition will improve Outcome: Progressing Goal: Knowledge of secondary prevention will improve Outcome: Progressing Goal: Knowledge of patient specific risk factors addressed and post discharge goals established will improve Outcome: Progressing Goal: Individualized Educational Video(s) Outcome: Progressing   Problem: Coping: Goal: Will verbalize positive feelings about self Outcome: Progressing Goal: Will identify appropriate support needs Outcome: Progressing   Problem: Health Behavior/Discharge Planning: Goal: Ability to manage health-related needs will improve Outcome: Progressing   Problem: Self-Care: Goal: Ability to participate in self-care as condition permits will improve Outcome: Progressing Goal: Verbalization of feelings and concerns over difficulty with self-care will improve Outcome: Progressing Goal: Ability to communicate needs accurately will improve Outcome: Progressing

## 2021-02-02 DIAGNOSIS — Z86718 Personal history of other venous thrombosis and embolism: Secondary | ICD-10-CM | POA: Diagnosis not present

## 2021-02-02 DIAGNOSIS — R519 Headache, unspecified: Secondary | ICD-10-CM

## 2021-02-02 DIAGNOSIS — I6602 Occlusion and stenosis of left middle cerebral artery: Secondary | ICD-10-CM | POA: Diagnosis not present

## 2021-02-02 DIAGNOSIS — Z8673 Personal history of transient ischemic attack (TIA), and cerebral infarction without residual deficits: Secondary | ICD-10-CM | POA: Diagnosis not present

## 2021-02-02 DIAGNOSIS — R1310 Dysphagia, unspecified: Secondary | ICD-10-CM

## 2021-02-02 DIAGNOSIS — I63412 Cerebral infarction due to embolism of left middle cerebral artery: Secondary | ICD-10-CM | POA: Diagnosis not present

## 2021-02-02 DIAGNOSIS — I63512 Cerebral infarction due to unspecified occlusion or stenosis of left middle cerebral artery: Secondary | ICD-10-CM

## 2021-02-02 DIAGNOSIS — E785 Hyperlipidemia, unspecified: Secondary | ICD-10-CM

## 2021-02-02 LAB — CBC
HCT: 44 % (ref 39.0–52.0)
Hemoglobin: 14.5 g/dL (ref 13.0–17.0)
MCH: 28.9 pg (ref 26.0–34.0)
MCHC: 33 g/dL (ref 30.0–36.0)
MCV: 87.8 fL (ref 80.0–100.0)
Platelets: 170 10*3/uL (ref 150–400)
RBC: 5.01 MIL/uL (ref 4.22–5.81)
RDW: 14.1 % (ref 11.5–15.5)
WBC: 7.1 10*3/uL (ref 4.0–10.5)
nRBC: 0 % (ref 0.0–0.2)

## 2021-02-02 LAB — BASIC METABOLIC PANEL
Anion gap: 5 (ref 5–15)
BUN: 15 mg/dL (ref 8–23)
CO2: 31 mmol/L (ref 22–32)
Calcium: 9.2 mg/dL (ref 8.9–10.3)
Chloride: 106 mmol/L (ref 98–111)
Creatinine, Ser: 1.22 mg/dL (ref 0.61–1.24)
GFR, Estimated: >60 mL/min (ref >60)
Glucose, Bld: 98 mg/dL (ref 70–99)
Potassium: 4.2 mmol/L (ref 3.5–5.1)
Sodium: 142 mmol/L (ref 135–145)

## 2021-02-02 MED ORDER — SENNOSIDES-DOCUSATE SODIUM 8.6-50 MG PO TABS
1.0000 | ORAL_TABLET | Freq: Two times a day (BID) | ORAL | Status: DC
  Administered 2021-02-02 – 2021-02-08 (×13): 1 via ORAL
  Filled 2021-02-02 (×13): qty 1

## 2021-02-02 NOTE — Plan of Care (Signed)
  Problem: Education: Goal: Knowledge of disease or condition will improve Outcome: Progressing Goal: Knowledge of secondary prevention will improve Outcome: Progressing Goal: Knowledge of patient specific risk factors addressed and post discharge goals established will improve Outcome: Progressing Goal: Individualized Educational Video(s) Outcome: Progressing   Problem: Coping: Goal: Will verbalize positive feelings about self Outcome: Progressing Goal: Will identify appropriate support needs Outcome: Progressing   Problem: Health Behavior/Discharge Planning: Goal: Ability to manage health-related needs will improve Outcome: Progressing   Problem: Self-Care: Goal: Ability to participate in self-care as condition permits will improve Outcome: Progressing Goal: Verbalization of feelings and concerns over difficulty with self-care will improve Outcome: Progressing

## 2021-02-02 NOTE — Progress Notes (Signed)
STROKE TEAM PROGRESS NOTE   SUBJECTIVE (INTERVAL HISTORY) No family at bedside.  Patient lying in bed, comfortable, not in distress, no headache.  Able to repeat sentences more clear than yesterday.  Right upper extremity 2+/5, right lower extremity 3/5, much improved from yesterday.  Encourage patient to do more self exercise.  OBJECTIVE Vitals:   02/02/21 0348 02/02/21 0549 02/02/21 0831 02/02/21 1115  BP: (!) 142/102 (!) 143/89 139/85 132/84  Pulse: 91 85 92 82  Resp: 16  (!) 22 20  Temp: 98.2 F (36.8 C)  98.6 F (37 C) 98.3 F (36.8 C)  TempSrc: Oral  Oral Oral  SpO2: 100%  97% 98%  Weight:      Height:        CBC:  Recent Labs  Lab 01/28/21 1210 01/28/21 1218 01/29/21 0017 02/02/21 0057  WBC 12.1*  --  9.8 7.1  NEUTROABS 10.3*  --  8.5*  --   HGB 15.3   < > 14.3 14.5  HCT 44.5   < > 43.0 44.0  MCV 85.4  --  86.7 87.8  PLT 249  --  236 170   < > = values in this interval not displayed.    Basic Metabolic Panel:  Recent Labs  Lab 01/29/21 0017 02/02/21 0057  NA 139 142  K 4.2 4.2  CL 105 106  CO2 26 31  GLUCOSE 123* 98  BUN 17 15  CREATININE 1.24 1.22  CALCIUM 8.8* 9.2    Lipid Panel:  Recent Labs  Lab 01/29/21 0017  CHOL 172  TRIG 150*  HDL 48  CHOLHDL 3.6  VLDL 30  LDLCALC 94   HgbA1c:  Lab Results  Component Value Date   HGBA1C 5.9 (H) 01/29/2021    IMAGING CT HEAD WO CONTRAST  Result Date: 02/01/2021 CLINICAL DATA:  Left frontal headache EXAM: CT HEAD WITHOUT CONTRAST TECHNIQUE: Contiguous axial images were obtained from the base of the skull through the vertex without intravenous contrast. COMPARISON:  01/30/2021 FINDINGS: Brain: Evolving left MCA territory infarction is again identified. Gyriform hyperdensity is again noted reflecting petechial hemorrhage. This does not appear substantially changed from the prior study. Additional smaller acute infarcts are better seen on the prior MRI. Chronic infarcts including involvement of right  occipital lobe, right medial temporal lobe, right caudate, and right cerebellum are again identified. Stable findings of probable chronic microvascular ischemic changes. No significant mass effect.  No hydrocephalus. Vascular: No new finding. Skull: Calvarium is unremarkable. Sinuses/Orbits: No acute finding. Other: None. IMPRESSION: Evolving recent left MCA territory infarction with similar petechial hemorrhage. No significant mass effect. Electronically Signed   By: Guadlupe SpanishPraneil  Patel M.D.   On: 02/01/2021 09:32   CT Head Wo Contrast  Result Date: 01/30/2021 CLINICAL DATA:  Stroke follow-up. EXAM: CT HEAD WITHOUT CONTRAST TECHNIQUE: Contiguous axial images were obtained from the base of the skull through the vertex without intravenous contrast. COMPARISON:  Brain MRI 01/29/2021. Noncontrast head CT and CT angiogram head/neck 01/28/2021. FINDINGS: Brain: Mild generalized parenchymal atrophy. Redemonstrated evolving acute cortically based left MCA territory infarct within the left frontoparietal lobes and left insula. Redemonstrated mild corresponding petechial hemorrhage. Small acute infarcts were better appreciated on the prior MRI. Chronic cortically based right occipital temporal lobe infarct. Chronic lacunar infarcts within the bilateral corona radiata and deep gray nuclei. Stable background moderate/advanced chronic small vessel ischemic disease within the cerebral white matter. Redemonstrated small chronic infarct within the right cerebellar hemisphere. No extra-axial fluid collection. No evidence of  an intracranial mass. No midline shift. Vascular: Atherosclerotic calcifications Skull: Normal. Negative for fracture or focal lesion. Sinuses/Orbits: Visualized orbits show no acute finding. Trace mucosal thickening within the right ethmoid air cells. Fluid levels and background mild mucosal thickening within the right greater than left sphenoid sinuses. Trace left maxillary sinus mucosal thickening. IMPRESSION:  Evolving moderate to large acute left MCA territory infarct, not appreciably changed in extent from the brain MRI of 01/29/2021. As before, there is mild petechial hemorrhage within the infarction territory. Associated small acute infarcts within the left caudate nucleus were better appreciated on the prior MRI. Otherwise stable non-contrast CT appearance of the brain. 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 Background chronic small vessel ischemic disease with multiple chronic lacunar infarcts. Small chronic infarct within the right cerebellar hemisphere. Mild generalized cerebral atrophy. Paranasal sinus disease, as described. Electronically Signed   By: Jackey Loge DO   On: 01/30/2021 08:30   MR BRAIN WO CONTRAST  Result Date: 01/29/2021 CLINICAL DATA:  Right-sided weakness.  History of stroke EXAM: MRI HEAD WITHOUT CONTRAST TECHNIQUE: Multiplanar, multiecho pulse sequences of the brain and surrounding structures were obtained without intravenous contrast. COMPARISON:  CT and CTA from yesterday FINDINGS: Brain: Large area of cortically based infarction in the lateral left frontal lobe, MCA branch distribution. Minimal patchy left caudate acute infarcts. Petechial hemorrhage is present. Remote cortically based infarct affecting the right occipital lobe. Chronic lacunar infarcts in the bilateral deep gray nuclei. Chronic small right cerebellar infarct. Confluent chronic small vessel ischemic gliosis in the deep white matter. Brain volume is normal. Chronic micro hemorrhages which are mainly peripheral but in areas that were likely affected by prior infarction. No hydrocephalus, collection, or masslike finding. Vascular: Preserved flow voids. Skull and upper cervical spine: Normal marrow signal Sinuses/Orbits: Nasopharyngeal and sinus opacification in the setting of intubation. Bilateral cataract resection. IMPRESSION: 1. Large acute left MCA branch infarct with petechial hemorrhage. Minimal  involvement in the left caudate head. 2. Chronic small vessel disease with chronic lacunar infarcts. Prior right PCA territory infarct. Electronically Signed   By: Marnee Spring M.D.   On: 01/29/2021 05:29   IR CT Head Ltd  Result Date: 01/30/2021 INDICATION: New onset aphasia with right-sided weakness. Occluded superior division of the left middle cerebral artery proximally on CT angiogram of the head and neck. EXAM: 1. EMERGENT LARGE VESSEL OCCLUSION THROMBOLYSIS (anterior CIRCULATION) COMPARISON:  CT angiogram of the head and neck of January 28, 2021. MEDICATIONS: Ancef 2 g IV antibiotic was administered within 1 hour of the procedure. ANESTHESIA/SEDATION: General anesthesia. CONTRAST:  Omnipaque 300 55 mL. FLUOROSCOPY TIME:  Fluoroscopy Time: 20 minutes 18 seconds (1160 mGy). COMPLICATIONS: None immediate. TECHNIQUE: Emergent consent was signed to physicians. No family members or next of kin were available on phone or physically. Patient was unable to provide consent due to his medical condition. The patient was then put under general anesthesia by the Department of Anesthesiology at [facility]. The right groin was prepped and draped in the usual sterile fashion. Thereafter using modified Seldinger technique, transfemoral access into the right common femoral artery was obtained without difficulty. Over a 0.035 inch guidewire an 8 [place] Pinnacle 25 cm sheath was inserted. Through this, and also over a 0.035 inch guidewire a 5 [place] JB 1 catheter was advanced to the aortic arch region and selectively positioned in the left common carotid artery. FINDINGS: The left common carotid arteriogram demonstrates the left external carotid artery and its major branches to  be widely patent. The left internal carotid artery at the bulb has mild narrowing proximally. More distally the vessel is seen to opacify to the cranial skull base. The petrous, cavernous and supraclinoid segments are patent with focal area of  mild stenosis in the caval segment. The left anterior cerebral artery opacifies normally into the capillary and venous phases. The left middle cerebral artery M1 segment is widely patent. The inferior division is normal into the capillary and venous phases. The superior division demonstrates a prominent side branch occlusion proximally. PROCEDURE: The diagnostic JB 1 catheter in the left common carotid artery was then exchanged over a 0.035 inch 300 cm Rosen exchange guidewire for an 087 balloon guide catheter which had been prepped with 50% contrast and 50% heparinized saline infusion. Guidewire was removed. Good aspiration obtained from the hub of the balloon guide catheter. Gentle control arteriogram performed through this demonstrated no evidence of spasms, dissections or of intraluminal filling defects. Over a 0.035 Roadrunner guidewire, this was then advanced to the distal 1/3 of the left internal carotid artery. The guidewire was removed. Again good aspiration obtained from the hub of the balloon guide catheter. Control arteriogram performed showed no evidence of spasms, dissections or of intraluminal filling defects. Over a 0.014 inch standard Synchro micro guidewire with a J-tip configuration the combination of an 055 Zoom aspiration and 136 cm catheter inside of which was a 160 cm 021 Trevo ProVue microcatheter was advanced to the supraclinoid left ICA. The micro guidewire was then gently maneuvered with a torque device into the occluded segment of the left middle cerebral artery superior division into the distal M3 region followed by the microcatheter. The guidewire was removed. Good aspiration obtained from the hub of the microcatheter. Gentle control arteriogram performed through the microcatheter demonstrated safe position of the tip of the microcatheter with antegrade flow of contrast. This was then connected to continuous heparin saline infusion. A 4 mm x 40 mm Solitaire X retrieval device was then  advanced to the distal end of the microcatheter. O ring on the delivery microcatheter was loosened. With slight forward gentle traction with the right hand on the delivery micro guidewire with the left hand the delivery microcatheter was retrieved deploying the retrieval device the proximal end of which was just proximal to the occluded inferior division. 055 Zoom aspiration catheter was advanced and engaged at the origin of the occluded branch. With proximal flow arrest in the left internal carotid artery, and with constant aspiration being applied the hub of 055 aspiration catheter with the Penumbra aspiration device, and a 20 mL syringe at the hub of the balloon guide catheter in the left internal carotid artery for approximately 2-1/2 minutes, the combination of the retrieval device, the microcatheter, and the Zoom aspiration catheter was retrieved and removed. Flow arrest was reversed in the left internal carotid artery. A control arteriogram performed through the balloon guide in the proximal left internal carotid artery demonstrated moderate spasm in the mid cervical left ICA. A 3 mm x 1 mm clot was noted entangled in the retrieval device. Wide patency was now noted of the previously occluded superior division inferior branch achieving a TICI 3 revascularization of the left middle cerebral artery distribution. The left anterior cerebral artery remained unchanged widely patent. Spasm noted at the distal end of the left internal carotid artery gradually resolved after 4 aliquots of 25 mcg of nitroglycerin intra-arterially. The balloon guide was then retrieved into the left common carotid artery. A final control  arteriogram performed through the balloon guide catheter demonstrated near complete resolution of the previous noted spasm in the left internal carotid artery distally. The left MCA and ACA distributions continued to show complete revascularization. The balloon guide was then removed. The 8 French sheath  was removed with hemostasis achieved with manual compression for approximately 20 minutes. Distal pulses remained Dopplerable in both feet unchanged. A CT of the brain demonstrated no evidence of intracranial hemorrhages. Patient was left intubated waiting the results of the COVID-19 test. IMPRESSION: Status post endovascular complete revascularization of occluded prominent side branch of the superior division of the left middle cerebral artery with 1 pass with a 4 mm x 40 mm Solitaire X retrieval device and contact aspiration achieving a TICI 3 revascularization. PLAN: Follow-up as per referring MD. Electronically Signed   By: Julieanne Cotton M.D.   On: 01/29/2021 13:05   DG CHEST PORT 1 VIEW  Result Date: 01/29/2021 CLINICAL DATA:  Endotracheal tube placement EXAM: PORTABLE CHEST 1 VIEW COMPARISON:  08/07/2018 FINDINGS: Endotracheal tube placed with tip measuring 3.6 cm above the carina. Enteric tube placed with tip not visualized off the field of view but below the left hemidiaphragm. Cardiac enlargement. Small left pleural effusion. Atelectasis or infiltration in both lung bases. No pneumothorax. Calcification of the aorta. IMPRESSION: Appliances appear in satisfactory position. Probable small left pleural effusion with atelectasis or infiltration in both lung bases. Electronically Signed   By: Burman Nieves M.D.   On: 01/29/2021 02:49   DG Abd Portable 1V  Result Date: 01/28/2021 CLINICAL DATA:  OG tube placement. EXAM: PORTABLE ABDOMEN - 1 VIEW COMPARISON:  None. FINDINGS: Tip and side port of the enteric tube below the diaphragm in the stomach. Air throughout nondilated small and large bowel in the upper abdomen. IMPRESSION: Tip and side port of the enteric tube below the diaphragm in the stomach. Electronically Signed   By: Narda Rutherford M.D.   On: 01/28/2021 18:02   EEG adult  Result Date: 02/02/2021 Charlsie Quest, MD     02/02/2021  8:41 AM Patient Name: Kenneth Yu MRN: [id number]  Epilepsy Attending: Charlsie Quest Referring Physician/Provider: Dr Marvel Plan Date: 02/01/2021 Duration: 29.82mins Patient history: Per RN and speech therapies, patient this morning had acute onset left forehead headache, CT repeat showed a slight increase of hemorrhagic conversion on the left.  However, patient later has no recollection of the headache and CT repeat.  EEG to rule out a seizure episode.  Level of alertness: Awake, asleep AEDs during EEG study: None Technical aspects: This EEG study was done with scalp electrodes positioned according to the 10-20 International system of electrode placement. Electrical activity was acquired at a sampling rate of 500Hz  and reviewed with a high frequency filter of 70Hz  and a low frequency filter of 1Hz . EEG data were recorded continuously and digitally stored. Description: The posterior dominant rhythm consists of 9 Hz activity of moderate voltage (25-35 uV) seen predominantly in posterior head regions, symmetric and reactive to eye opening and eye closing. Sleep was characterized by vertex waves, sleep spindles (12 to 14 Hz), maximal frontocentral region. EEG showed continuous left frontotemporal 3 to 6 Hz theta-delta slowing. Hyperventilation and photic stimulation were not performed.   ABNORMALITY - Continuous slow,  left frontotemporal region IMPRESSION: This study is suggestive of cortical dysfunction arising from left frontotemporal region likely secondary to underlying structural abnormality/stroke. No seizures or epileptiform discharges were seen throughout the recording. Priyanka Annabelle Harman   ECHOCARDIOGRAM COMPLETE  Result Date: 01/29/2021    ECHOCARDIOGRAM REPORT   Patient Name:   JACEY PELC Date of Exam: 01/29/2021 Medical Rec #:  [id number]     Height:       66.0 in Accession #:    [id number]    Weight:       202.2 lb Date of Birth:  05-29-1956    BSA:          2.009 m Patient Age:    64 years      BP:           143/82 mmHg Patient Gender: M              HR:           98 bpm. Exam Location:  Inpatient Procedure: 2D Echo, Cardiac Doppler, Color Doppler and Intracardiac            Opacification Agent Indications:    Stroke  History:        Patient has no prior history of Echocardiogram examinations.                 Arrythmias:Atrial Fibrillation; Risk Factors:Former Smoker. H/O                 CVA.  Sonographer:    Ross Ludwig RDCS (AE) Referring Phys: [phone] [facility] P [facility]  Sonographer Comments: Suboptimal apical window. IMPRESSIONS  1. Left ventricular ejection fraction, by estimation, is 50 to 55%. The left ventricle has low normal function. The left ventricle demonstrates regional wall motion abnormalities (lateral wall hypokinesis). There is mild concentric left ventricular hypertrophy. Left ventricular diastolic parameters are consistent with Grade II diastolic dysfunction (pseudonormalization).  2. Right ventricular systolic function is normal. The right ventricular size is mildly enlarged. There is mildly elevated pulmonary artery systolic pressure. The estimated right ventricular systolic pressure is 40.5 mmHg.  3. Left atrial size was mildly dilated.  4. Right atrial size was severely dilated.  5. The mitral valve is grossly normal. Mild mitral valve regurgitation. The mean mitral valve gradient is 3.0 mmHg with average heart rate of 89 bpm.  6. The aortic valve is tricuspid. There is mild calcification of the aortic valve. There is mild thickening of the aortic valve. Aortic valve regurgitation is not visualized. Mild aortic valve sclerosis is present, with no evidence of aortic valve stenosis.  7. The inferior vena cava is normal in size with <50% respiratory variability, suggesting right atrial pressure of 8 mmHg. FINDINGS  Left Ventricle: Left ventricular ejection fraction, by estimation, is 50 to 55%. The left ventricle has low normal function. The left ventricle demonstrates regional wall motion abnormalities. Definity contrast agent was given IV  to delineate the left ventricular endocardial borders. The left ventricular internal cavity size was normal in size. There is mild concentric left ventricular hypertrophy. Left ventricular diastolic parameters are consistent with Grade II diastolic dysfunction (pseudonormalization).  LV Wall Scoring: The mid and distal lateral wall, posterior wall, mid anterolateral segment, and apical anterior segment are hypokinetic. The basal anterolateral segment is normal. Right Ventricle: The right ventricular size is mildly enlarged. No increase in right ventricular wall thickness. Right ventricular systolic function is normal. There is mildly elevated pulmonary artery systolic pressure. The tricuspid regurgitant velocity is 2.85 m/s, and with an assumed right atrial pressure of 8 mmHg, the estimated right ventricular systolic pressure is 40.5 mmHg. Left Atrium: Left atrial size was mildly dilated. Right Atrium: Right atrial size was severely dilated. Pericardium: There is  no evidence of pericardial effusion. Mitral Valve: The mitral valve is grossly normal. Mild mitral valve regurgitation. MV peak gradient, 6.8 mmHg. The mean mitral valve gradient is 3.0 mmHg with average heart rate of 89 bpm. Tricuspid Valve: The tricuspid valve is normal in structure. Tricuspid valve regurgitation is mild . No evidence of tricuspid stenosis. Aortic Valve: The aortic valve is tricuspid. There is mild calcification of the aortic valve. There is mild thickening of the aortic valve. Aortic valve regurgitation is not visualized. Mild aortic valve sclerosis is present, with no evidence of aortic valve stenosis. Aortic valve mean gradient measures 5.0 mmHg. Aortic valve peak gradient measures 10.1 mmHg. Aortic valve area, by VTI measures 1.59 cm. Pulmonic Valve: The pulmonic valve was normal in structure. Pulmonic valve regurgitation is not visualized. No evidence of pulmonic stenosis. Aorta: The aortic root and ascending aorta are structurally  normal, with no evidence of dilitation. Venous: The inferior vena cava is normal in size with less than 50% respiratory variability, suggesting right atrial pressure of 8 mmHg. IAS/Shunts: The atrial septum is grossly normal.  LEFT VENTRICLE PLAX 2D LVIDd:         5.10 cm  Diastology LVIDs:         3.60 cm  LV e' medial:    7.40 cm/s LV PW:         1.60 cm  LV E/e' medial:  15.1 LV IVS:        1.80 cm  LV e' lateral:   7.78 cm/s LVOT diam:     1.90 cm  LV E/e' lateral: 14.4 LV SV:         44 LV SV Index:   22 LVOT Area:     2.84 cm  RIGHT VENTRICLE             IVC RV Basal diam:  4.30 cm     IVC diam: 2.30 cm RV S prime:     12.30 cm/s TAPSE (M-mode): 1.4 cm LEFT ATRIUM             Index       RIGHT ATRIUM           Index LA diam:        3.60 cm 1.79 cm/m  RA Area:     36.50 cm LA Vol (A2C):   73.0 ml 36.34 ml/m RA Volume:   156.00 ml 77.65 ml/m LA Vol (A4C):   73.0 ml 36.34 ml/m LA Biplane Vol: 74.3 ml 36.98 ml/m  AORTIC VALVE AV Area (Vmax):    1.66 cm AV Area (Vmean):   1.40 cm AV Area (VTI):     1.59 cm AV Vmax:           159.00 cm/s AV Vmean:          108.000 cm/s AV VTI:            0.275 m AV Peak Grad:      10.1 mmHg AV Mean Grad:      5.0 mmHg LVOT Vmax:         93.20 cm/s LVOT Vmean:        53.200 cm/s LVOT VTI:          0.154 m LVOT/AV VTI ratio: 0.56  AORTA Ao Root diam: 3.10 cm Ao Asc diam:  3.60 cm MITRAL VALVE                TRICUSPID VALVE MV Area (PHT): 4.41 cm  TR Peak grad:   32.5 mmHg MV Area VTI:   1.85 cm     TR Vmax:        285.00 cm/s MV Peak grad:  6.8 mmHg MV Mean grad:  3.0 mmHg     SHUNTS MV Vmax:       1.30 m/s     Systemic VTI:  0.15 m MV Vmean:      75.2 cm/s    Systemic Diam: 1.90 cm MV Decel Time: 172 msec MV E velocity: 112.00 cm/s MV A velocity: 121.00 cm/s MV E/A ratio:  0.93 Riley Lam MD Electronically signed by Riley Lam MD Signature Date/Time: 01/29/2021/12:28:37 PM    Final    IR PERCUTANEOUS ART THROMBECTOMY/INFUSION INTRACRANIAL INC DIAG  ANGIO  Result Date: 01/30/2021 INDICATION: New onset aphasia with right-sided weakness. Occluded superior division of the left middle cerebral artery proximally on CT angiogram of the head and neck. EXAM: 1. EMERGENT LARGE VESSEL OCCLUSION THROMBOLYSIS (anterior CIRCULATION) COMPARISON:  CT angiogram of the head and neck of January 28, 2021. MEDICATIONS: Ancef 2 g IV antibiotic was administered within 1 hour of the procedure. ANESTHESIA/SEDATION: General anesthesia. CONTRAST:  Omnipaque 300 55 mL. FLUOROSCOPY TIME:  Fluoroscopy Time: 20 minutes 18 seconds (1160 mGy). COMPLICATIONS: None immediate. TECHNIQUE: Emergent consent was signed to physicians. No family members or next of kin were available on phone or physically. Patient was unable to provide consent due to his medical condition. The patient was then put under general anesthesia by the Department of Anesthesiology at [facility]. The right groin was prepped and draped in the usual sterile fashion. Thereafter using modified Seldinger technique, transfemoral access into the right common femoral artery was obtained without difficulty. Over a 0.035 inch guidewire an 8 [place] Pinnacle 25 cm sheath was inserted. Through this, and also over a 0.035 inch guidewire a 5 [place] JB 1 catheter was advanced to the aortic arch region and selectively positioned in the left common carotid artery. FINDINGS: The left common carotid arteriogram demonstrates the left external carotid artery and its major branches to be widely patent. The left internal carotid artery at the bulb has mild narrowing proximally. More distally the vessel is seen to opacify to the cranial skull base. The petrous, cavernous and supraclinoid segments are patent with focal area of mild stenosis in the caval segment. The left anterior cerebral artery opacifies normally into the capillary and venous phases. The left middle cerebral artery M1 segment is widely patent. The inferior division is normal  into the capillary and venous phases. The superior division demonstrates a prominent side branch occlusion proximally. PROCEDURE: The diagnostic JB 1 catheter in the left common carotid artery was then exchanged over a 0.035 inch 300 cm Rosen exchange guidewire for an 087 balloon guide catheter which had been prepped with 50% contrast and 50% heparinized saline infusion. Guidewire was removed. Good aspiration obtained from the hub of the balloon guide catheter. Gentle control arteriogram performed through this demonstrated no evidence of spasms, dissections or of intraluminal filling defects. Over a 0.035 Roadrunner guidewire, this was then advanced to the distal 1/3 of the left internal carotid artery. The guidewire was removed. Again good aspiration obtained from the hub of the balloon guide catheter. Control arteriogram performed showed no evidence of spasms, dissections or of intraluminal filling defects. Over a 0.014 inch standard Synchro micro guidewire with a J-tip configuration the combination of an 055 Zoom aspiration and 136 cm catheter inside of which was a 160 cm  021 Trevo ProVue microcatheter was advanced to the supraclinoid left ICA. The micro guidewire was then gently maneuvered with a torque device into the occluded segment of the left middle cerebral artery superior division into the distal M3 region followed by the microcatheter. The guidewire was removed. Good aspiration obtained from the hub of the microcatheter. Gentle control arteriogram performed through the microcatheter demonstrated safe position of the tip of the microcatheter with antegrade flow of contrast. This was then connected to continuous heparin saline infusion. A 4 mm x 40 mm Solitaire X retrieval device was then advanced to the distal end of the microcatheter. O ring on the delivery microcatheter was loosened. With slight forward gentle traction with the right hand on the delivery micro guidewire with the left hand the delivery  microcatheter was retrieved deploying the retrieval device the proximal end of which was just proximal to the occluded inferior division. 055 Zoom aspiration catheter was advanced and engaged at the origin of the occluded branch. With proximal flow arrest in the left internal carotid artery, and with constant aspiration being applied the hub of 055 aspiration catheter with the Penumbra aspiration device, and a 20 mL syringe at the hub of the balloon guide catheter in the left internal carotid artery for approximately 2-1/2 minutes, the combination of the retrieval device, the microcatheter, and the Zoom aspiration catheter was retrieved and removed. Flow arrest was reversed in the left internal carotid artery. A control arteriogram performed through the balloon guide in the proximal left internal carotid artery demonstrated moderate spasm in the mid cervical left ICA. A 3 mm x 1 mm clot was noted entangled in the retrieval device. Wide patency was now noted of the previously occluded superior division inferior branch achieving a TICI 3 revascularization of the left middle cerebral artery distribution. The left anterior cerebral artery remained unchanged widely patent. Spasm noted at the distal end of the left internal carotid artery gradually resolved after 4 aliquots of 25 mcg of nitroglycerin intra-arterially. The balloon guide was then retrieved into the left common carotid artery. A final control arteriogram performed through the balloon guide catheter demonstrated near complete resolution of the previous noted spasm in the left internal carotid artery distally. The left MCA and ACA distributions continued to show complete revascularization. The balloon guide was then removed. The 8 French sheath was removed with hemostasis achieved with manual compression for approximately 20 minutes. Distal pulses remained Dopplerable in both feet unchanged. A CT of the brain demonstrated no evidence of intracranial  hemorrhages. Patient was left intubated waiting the results of the COVID-19 test. IMPRESSION: Status post endovascular complete revascularization of occluded prominent side branch of the superior division of the left middle cerebral artery with 1 pass with a 4 mm x 40 mm Solitaire X retrieval device and contact aspiration achieving a TICI 3 revascularization. PLAN: Follow-up as per referring MD. Electronically Signed   By: Julieanne Cotton M.D.   On: 01/29/2021 13:05   CT HEAD CODE STROKE WO CONTRAST  Result Date: 01/28/2021 CLINICAL DATA:  Right-sided weakness EXAM: CT ANGIOGRAPHY HEAD AND NECK CT PERFUSION BRAIN TECHNIQUE: Multidetector CT imaging of the head and neck was performed using the standard protocol during bolus administration of intravenous contrast. Multiplanar CT image reconstructions and MIPs were obtained to evaluate the vascular anatomy. Carotid stenosis measurements (when applicable) are obtained utilizing NASCET criteria, using the distal internal carotid diameter as the denominator. Multiphase CT imaging of the brain was performed following IV bolus contrast injection. Subsequent  parametric perfusion maps were calculated using RAPID software. CONTRAST:  80 mL Omnipaque 350 COMPARISON:  Report of CTA from 2015 only. Images are unavailable at the time of dictation. FINDINGS: CT HEAD Brain: There is no acute intracranial hemorrhage or mass effect. There is hypoattenuation with loss of gray-white differentiation involving left insula and left frontoparietal lobes (pre and postcentral gyri). There is likely a chronic small left frontal cortical infarct involving the precentral gyrus. Chronic right occipitotemporal infarct. There are age-indeterminate but probably chronic small vessel infarcts of basal ganglia and central white matter bilaterally. Additional patchy and confluent areas of low-attenuation in the supratentorial white matter nonspecific but may reflect moderate chronic microvascular  ischemic changes. Vascular: No definite hyperdense vessel. There may be focal hyperdensity of distal left M2 branch within the sylvian fissure, which would correspond to CTA findings. Skull: Calvarium is unremarkable. Sinuses/Orbits: No acute finding. Other: None. Review of the MIP images confirms the above findings ASPECTS [facility] Stroke Program Early CT Score) - Ganglionic level infarction (caudate, lentiform nuclei, internal capsule, insula, M1-M3 cortex): 5 - Supraganglionic infarction (M4-M6 cortex): 2 Total score (0-10 with 10 being normal): 7 CTA NECK Aortic arch: Great vessel origins are patent with mild calcified plaque. Right carotid system: Patent. Calcified plaque at the bifurcation and along the proximal internal carotid with less than 50% stenosis. Retropharyngeal course of the ICA. Left carotid system: Patent. Mixed plaque at the bifurcation and proximal internal carotid without stenosis. Retropharyngeal course of the ICA. Vertebral arteries: Patent and codominant.  No stenosis. Skeleton: Mild degenerative changes of the cervical spine. Other neck: Diffusely enlarged but homogeneous appearing thyroid. No ultrasound follow-up is recommended by current guidelines. Upper chest: Mild centrilobular emphysema. Review of the MIP images confirms the above findings CTA HEAD Anterior circulation: Intracranial internal carotid arteries are patent with calcified plaque along the cavernous and proximal supraclinoid portions. There is moderate to marked stenosis of the proximal left cavernous segment. Additional areas of moderate stenosis bilaterally. Left M1 MCA is patent. There is distal left M2 branch occlusion within the posterior sylvian fissure. Right middle cerebral artery is patent. Anterior cerebral arteries are patent. Anterior communicating artery is present. Posterior circulation: Intracranial vertebral arteries are patent. Basilar artery is patent. Major cerebellar artery origins are patent. Left  posterior cerebral artery is patent. There is significantly decreased flow within the right PCA beginning at the P2 segment probably related to prior infarct. Venous sinuses: Patent as allowed by contrast bolus timing. Review of the MIP images confirms the above findings CT Brain Perfusion Findings: CBF (<30%) Volume: 11mL Perfusion (Tmax>6.0s) volume: 39mL Mismatch Volume: 28mL Infarction Location: Left MCA territory IMPRESSION: No acute intracranial hemorrhage. Acute left MCA territory infarction with ASPECT score of 7. Distal left M2 MCA branch occlusion. Perfusion imaging demonstrates core infarction of 11 mL congruent with noncontrast CT findings. There is a calculated penumbra of 28 mL. Additional chronic findings detailed above including infarcts and microvascular ischemic changes. Plaque without hemodynamically significant stenosis at the ICA origins. Calcified plaque along the intracranial internal carotid arteries. Suspect moderate to marked stenosis of the proximal left cavernous segment. Additional areas of moderate stenosis bilaterally. Significantly decreased flow within the right PCA beginning at the P2 segment probably related to prior infarct. Emphysema. Mild enlargement of the main pulmonary artery, which could indicate pulmonary arterial hypertension. Initial results were communicated to Dr. Amada Jupiter at 12:27 pm on 01/28/2021 by text page via the [facility] messaging system. Electronically Signed   By: Jackquline Berlin.D.  On: 01/28/2021 12:44   CT ANGIO HEAD NECK W WO CM W PERF (CODE STROKE)  Result Date: 01/28/2021 CLINICAL DATA:  Right-sided weakness EXAM: CT ANGIOGRAPHY HEAD AND NECK CT PERFUSION BRAIN TECHNIQUE: Multidetector CT imaging of the head and neck was performed using the standard protocol during bolus administration of intravenous contrast. Multiplanar CT image reconstructions and MIPs were obtained to evaluate the vascular anatomy. Carotid stenosis measurements (when applicable)  are obtained utilizing NASCET criteria, using the distal internal carotid diameter as the denominator. Multiphase CT imaging of the brain was performed following IV bolus contrast injection. Subsequent parametric perfusion maps were calculated using RAPID software. CONTRAST:  80 mL Omnipaque 350 COMPARISON:  Report of CTA from 2015 only. Images are unavailable at the time of dictation. FINDINGS: CT HEAD Brain: There is no acute intracranial hemorrhage or mass effect. There is hypoattenuation with loss of gray-white differentiation involving left insula and left frontoparietal lobes (pre and postcentral gyri). There is likely a chronic small left frontal cortical infarct involving the precentral gyrus. Chronic right occipitotemporal infarct. There are age-indeterminate but probably chronic small vessel infarcts of basal ganglia and central white matter bilaterally. Additional patchy and confluent areas of low-attenuation in the supratentorial white matter nonspecific but may reflect moderate chronic microvascular ischemic changes. Vascular: No definite hyperdense vessel. There may be focal hyperdensity of distal left M2 branch within the sylvian fissure, which would correspond to CTA findings. Skull: Calvarium is unremarkable. Sinuses/Orbits: No acute finding. Other: None. Review of the MIP images confirms the above findings ASPECTS [facility] Stroke Program Early CT Score) - Ganglionic level infarction (caudate, lentiform nuclei, internal capsule, insula, M1-M3 cortex): 5 - Supraganglionic infarction (M4-M6 cortex): 2 Total score (0-10 with 10 being normal): 7 CTA NECK Aortic arch: Great vessel origins are patent with mild calcified plaque. Right carotid system: Patent. Calcified plaque at the bifurcation and along the proximal internal carotid with less than 50% stenosis. Retropharyngeal course of the ICA. Left carotid system: Patent. Mixed plaque at the bifurcation and proximal internal carotid without stenosis.  Retropharyngeal course of the ICA. Vertebral arteries: Patent and codominant.  No stenosis. Skeleton: Mild degenerative changes of the cervical spine. Other neck: Diffusely enlarged but homogeneous appearing thyroid. No ultrasound follow-up is recommended by current guidelines. Upper chest: Mild centrilobular emphysema. Review of the MIP images confirms the above findings CTA HEAD Anterior circulation: Intracranial internal carotid arteries are patent with calcified plaque along the cavernous and proximal supraclinoid portions. There is moderate to marked stenosis of the proximal left cavernous segment. Additional areas of moderate stenosis bilaterally. Left M1 MCA is patent. There is distal left M2 branch occlusion within the posterior sylvian fissure. Right middle cerebral artery is patent. Anterior cerebral arteries are patent. Anterior communicating artery is present. Posterior circulation: Intracranial vertebral arteries are patent. Basilar artery is patent. Major cerebellar artery origins are patent. Left posterior cerebral artery is patent. There is significantly decreased flow within the right PCA beginning at the P2 segment probably related to prior infarct. Venous sinuses: Patent as allowed by contrast bolus timing. Review of the MIP images confirms the above findings CT Brain Perfusion Findings: CBF (<30%) Volume: 11mL Perfusion (Tmax>6.0s) volume: 39mL Mismatch Volume: 28mL Infarction Location: Left MCA territory IMPRESSION: No acute intracranial hemorrhage. Acute left MCA territory infarction with ASPECT score of 7. Distal left M2 MCA branch occlusion. Perfusion imaging demonstrates core infarction of 11 mL congruent with noncontrast CT findings. There is a calculated penumbra of 28 mL. Additional chronic findings detailed  above including infarcts and microvascular ischemic changes. Plaque without hemodynamically significant stenosis at the ICA origins. Calcified plaque along the intracranial internal  carotid arteries. Suspect moderate to marked stenosis of the proximal left cavernous segment. Additional areas of moderate stenosis bilaterally. Significantly decreased flow within the right PCA beginning at the P2 segment probably related to prior infarct. Emphysema. Mild enlargement of the main pulmonary artery, which could indicate pulmonary arterial hypertension. Initial results were communicated to Dr. Amada Jupiter at 12:27 pm on 01/28/2021 by text page via the [facility] messaging system. Electronically Signed   By: Guadlupe Spanish M.D.   On: 01/28/2021 12:44     PHYSICAL EXAM  General exam Pleasant middle-aged African-American male HEENT-  Normocephalic, no lesions, without obvious abnormality.  Normal external eye and conjunctiva.   Cardiovascular- regular rhythm, on tele, tachycardic   Lungs-clear to auscultation.  Abdomen- soft Musculoskeletal-no joint deformity; deep scarring on the medial aspect of the left mid-leg. Skin-warm and dry, well-perfused; redness on the left knee.  Neurologic Exam: Awake, alert, eyes open, expressive aphasia, only able to repeat short sentences although moderate to severe dysarthric.  Follows simple commands, comprehension seems intact.  No gaze palsy, tracking bilaterally, visual field full, right facial droop, tongue protrusion to the right.  Right upper extremity is 2/5, right lower extremity 3/5.  Left upper and lower extremity 5/5.  Sensation decreased on the right.  Left finger-to-nose intact.  Gait not tested.   ASSESSMENT/PLAN Kenneth Yu is a 65 y.o. male with a past medical history significant for previous stroke, DVT on Xarelto, HTN, CAD/MI, HLD who presented to [facility] after being found by his ex-wife with right-sided weakness and aphasia. On arrival, imaging showed a distal left M2 MCA branch occlusion with a large area of penumbra and the CT perfusion is favorable for intervention and was taken for emergent thrombectomy. More recent MR this morning  showed a large acute left MCA branch infarct with petechial hemorrhage.  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 fib LDL 94  HgbA1c 5.9 Was on aspirin 81, currently on hold given hemorrhagic conversion with slightly increased hemorrhagic conversion and severe headache Ongoing aggressive stroke risk factor management Therapy recommendations SNF Disposition pending  Episode of headache and no recollection Occurred on 02/02/2019 due to while working with speech therapist CT repeat slight increase of hemorrhagic conversion on the left MCA EEG no seizure Fioricet as needed Hold off aspirin for now, repeat CT in a.m.  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 Pearlean Brownie 01/30/21 note)  Hypertension Stable Continue lisinopril, amlodipine BP goal less than 160 given hemorrhagic conversion Long-term BP goal normotensive  Hyperlipidemia LDL 94, goal < 70  Add Lipitor 40mg  daily Continue statin at discharge  Dysphagia  Passed swallow on dys 1 and thin Speech on board Avoid aspiration  Hospital day # 5    Marvel Plan, MD PhD Stroke Neurology 02/02/2021 3:29 PM   To contact Stroke Continuity  provider, please refer to [email or web address]. After hours, contact General Neurology

## 2021-02-02 NOTE — Procedures (Signed)
Patient Name: Kenneth Yu  MRN: [id number]  Epilepsy Attending: Charlsie Quest  Referring Physician/Provider: Dr Marvel Plan Date: 02/01/2021 Duration: 29.28mins  Patient history: Per RN and speech therapies, patient this morning had acute onset left forehead headache, CT repeat showed a slight increase of hemorrhagic conversion on the left.  However, patient later has no recollection of the headache and CT repeat.  EEG to rule out a seizure episode.   Level of alertness: Awake, asleep  AEDs during EEG study: None  Technical aspects: This EEG study was done with scalp electrodes positioned according to the 10-20 International system of electrode placement. Electrical activity was acquired at a sampling rate of 500Hz  and reviewed with a high frequency filter of 70Hz  and a low frequency filter of 1Hz . EEG data were recorded continuously and digitally stored.   Description: The posterior dominant rhythm consists of 9 Hz activity of moderate voltage (25-35 uV) seen predominantly in posterior head regions, symmetric and reactive to eye opening and eye closing. Sleep was characterized by vertex waves, sleep spindles (12 to 14 Hz), maximal frontocentral region. EEG showed continuous left frontotemporal 3 to 6 Hz theta-delta slowing. Hyperventilation and photic stimulation were not performed.     ABNORMALITY - Continuous slow,  left frontotemporal region  IMPRESSION: This study is suggestive of cortical dysfunction arising from left frontotemporal region likely secondary to underlying structural abnormality/stroke. No seizures or epileptiform discharges were seen throughout the recording.    Kenneth Yu 

## 2021-02-03 ENCOUNTER — Inpatient Hospital Stay (HOSPITAL_COMMUNITY): Payer: Medicare (Managed Care)

## 2021-02-03 DIAGNOSIS — Z8673 Personal history of transient ischemic attack (TIA), and cerebral infarction without residual deficits: Secondary | ICD-10-CM | POA: Diagnosis not present

## 2021-02-03 DIAGNOSIS — I6602 Occlusion and stenosis of left middle cerebral artery: Secondary | ICD-10-CM | POA: Diagnosis not present

## 2021-02-03 DIAGNOSIS — I6389 Other cerebral infarction: Secondary | ICD-10-CM | POA: Diagnosis not present

## 2021-02-03 DIAGNOSIS — Z86718 Personal history of other venous thrombosis and embolism: Secondary | ICD-10-CM | POA: Diagnosis not present

## 2021-02-03 LAB — BASIC METABOLIC PANEL
Anion gap: 6 (ref 5–15)
BUN: 15 mg/dL (ref 8–23)
CO2: 27 mmol/L (ref 22–32)
Calcium: 9.2 mg/dL (ref 8.9–10.3)
Chloride: 108 mmol/L (ref 98–111)
Creatinine, Ser: 1.06 mg/dL (ref 0.61–1.24)
GFR, Estimated: >60 mL/min (ref >60)
Glucose, Bld: 104 mg/dL — ABNORMAL HIGH (ref 70–99)
Potassium: 4.1 mmol/L (ref 3.5–5.1)
Sodium: 141 mmol/L (ref 135–145)

## 2021-02-03 LAB — CBC
HCT: 45.2 % (ref 39.0–52.0)
Hemoglobin: 14.8 g/dL (ref 13.0–17.0)
MCH: 28.9 pg (ref 26.0–34.0)
MCHC: 32.7 g/dL (ref 30.0–36.0)
MCV: 88.3 fL (ref 80.0–100.0)
Platelets: 171 10*3/uL (ref 150–400)
RBC: 5.12 MIL/uL (ref 4.22–5.81)
RDW: 13.9 % (ref 11.5–15.5)
WBC: 7.1 10*3/uL (ref 4.0–10.5)
nRBC: 0 % (ref 0.0–0.2)

## 2021-02-03 LAB — SARS CORONAVIRUS 2 (TAT 6-24 HRS): SARS Coronavirus 2: NEGATIVE

## 2021-02-03 MED ORDER — ASPIRIN EC 81 MG PO TBEC
81.0000 mg | DELAYED_RELEASE_TABLET | Freq: Every day | ORAL | Status: DC
  Administered 2021-02-03 – 2021-02-08 (×6): 81 mg via ORAL
  Filled 2021-02-03 (×6): qty 1

## 2021-02-03 NOTE — Progress Notes (Signed)
STROKE TEAM PROGRESS NOTE   SUBJECTIVE (INTERVAL HISTORY) No family at bedside.  Patient lying in bed, initially sleeping, however easily arousable, still has expressive aphasia but follows simple commands, able to repeat with severe dysarthria voice.  Right arm and leg continue to improve in strength.  CT showed stable mild hemorrhagic conversion, will resume aspirin 81 today.  OBJECTIVE Vitals:   02/02/21 2357 02/03/21 0313 02/03/21 0741 02/03/21 1201  BP: 131/76 127/78 128/85 (!) 149/85  Pulse: 87 72 78 82  Resp: 18 18 17 19   Temp: 97.9 F (36.6 C) 97.8 F (36.6 C) 98.7 F (37.1 C) 98.6 F (37 C)  TempSrc: Oral Oral Oral Oral  SpO2: 99% 100% 99% (!) 89%  Weight:      Height:        CBC:  Recent Labs  Lab 01/28/21 1210 01/28/21 1218 01/29/21 0017 02/02/21 0057 02/03/21 0308  WBC 12.1*  --  9.8 7.1 7.1  NEUTROABS 10.3*  --  8.5*  --   --   HGB 15.3   < > 14.3 14.5 14.8  HCT 44.5   < > 43.0 44.0 45.2  MCV 85.4  --  86.7 87.8 88.3  PLT 249  --  236 170 171   < > = values in this interval not displayed.    Basic Metabolic Panel:  Recent Labs  Lab 02/02/21 0057 02/03/21 0308  NA 142 141  K 4.2 4.1  CL 106 108  CO2 31 27  GLUCOSE 98 104*  BUN 15 15  CREATININE 1.22 1.06  CALCIUM 9.2 9.2    Lipid Panel:  Recent Labs  Lab 01/29/21 0017  CHOL 172  TRIG 150*  HDL 48  CHOLHDL 3.6  VLDL 30  LDLCALC 94   HgbA1c:  Lab Results  Component Value Date   HGBA1C 5.9 (H) 01/29/2021    IMAGING CT HEAD WO CONTRAST  Result Date: 02/03/2021 CLINICAL DATA:  Stroke follow-up EXAM: CT HEAD WITHOUT CONTRAST TECHNIQUE: Contiguous axial images were obtained from the base of the skull through the vertex without intravenous contrast. COMPARISON:  Two days ago FINDINGS: Brain: Left MCA branch infarct in the lateral and posterior frontal lobe with petechial hemorrhage. No progressive finding. Background of advanced chronic small vessel ischemia with confluent gliosis and  chronic lacunar infarct at the right basal ganglia. Remote right occipital cortex infarct. Remote right cerebellar infarct. No hydrocephalus or masslike finding Vascular: Stable Skull: Normal. Negative for fracture or focal lesion. Sinuses/Orbits: Bilateral cataract resection IMPRESSION: No adverse evolution of the left MCA branch infarct with petechial hemorrhage. Electronically Signed   By: Marnee Spring M.D.   On: 02/03/2021 10:32   CT HEAD WO CONTRAST  Result Date: 02/01/2021 CLINICAL DATA:  Left frontal headache EXAM: CT HEAD WITHOUT CONTRAST TECHNIQUE: Contiguous axial images were obtained from the base of the skull through the vertex without intravenous contrast. COMPARISON:  01/30/2021 FINDINGS: Brain: Evolving left MCA territory infarction is again identified. Gyriform hyperdensity is again noted reflecting petechial hemorrhage. This does not appear substantially changed from the prior study. Additional smaller acute infarcts are better seen on the prior MRI. Chronic infarcts including involvement of right occipital lobe, right medial temporal lobe, right caudate, and right cerebellum are again identified. Stable findings of probable chronic microvascular ischemic changes. No significant mass effect.  No hydrocephalus. Vascular: No new finding. Skull: Calvarium is unremarkable. Sinuses/Orbits: No acute finding. Other: None. IMPRESSION: Evolving recent left MCA territory infarction with similar petechial hemorrhage. No significant  mass effect. Electronically Signed   By: Guadlupe Spanish M.D.   On: 02/01/2021 09:32   CT Head Wo Contrast  Result Date: 01/30/2021 CLINICAL DATA:  Stroke follow-up. EXAM: CT HEAD WITHOUT CONTRAST TECHNIQUE: Contiguous axial images were obtained from the base of the skull through the vertex without intravenous contrast. COMPARISON:  Brain MRI 01/29/2021. Noncontrast head CT and CT angiogram head/neck 01/28/2021. FINDINGS: Brain: Mild generalized parenchymal atrophy.  Redemonstrated evolving acute cortically based left MCA territory infarct within the left frontoparietal lobes and left insula. Redemonstrated mild corresponding petechial hemorrhage. Small acute infarcts were better appreciated on the prior MRI. Chronic cortically based right occipital temporal lobe infarct. Chronic lacunar infarcts within the bilateral corona radiata and deep gray nuclei. Stable background moderate/advanced chronic small vessel ischemic disease within the cerebral white matter. Redemonstrated small chronic infarct within the right cerebellar hemisphere. No extra-axial fluid collection. No evidence of an intracranial mass. No midline shift. Vascular: Atherosclerotic calcifications Skull: Normal. Negative for fracture or focal lesion. Sinuses/Orbits: Visualized orbits show no acute finding. Trace mucosal thickening within the right ethmoid air cells. Fluid levels and background mild mucosal thickening within the right greater than left sphenoid sinuses. Trace left maxillary sinus mucosal thickening. IMPRESSION: Evolving moderate to large acute left MCA territory infarct, not appreciably changed in extent from the brain MRI of 01/29/2021. As before, there is mild petechial hemorrhage within the infarction territory. Associated small acute infarcts within the left caudate nucleus were better appreciated on the prior MRI. Otherwise stable non-contrast CT appearance of the brain. 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 Background chronic small vessel ischemic disease with multiple chronic lacunar infarcts. Small chronic infarct within the right cerebellar hemisphere. Mild generalized cerebral atrophy. Paranasal sinus disease, as described. Electronically Signed   By: Jackey Loge DO   On: 01/30/2021 08:30   MR BRAIN WO CONTRAST  Result Date: 01/29/2021 CLINICAL DATA:  Right-sided weakness.  History of stroke EXAM: MRI HEAD WITHOUT CONTRAST TECHNIQUE: Multiplanar, multiecho pulse  sequences of the brain and surrounding structures were obtained without intravenous contrast. COMPARISON:  CT and CTA from yesterday FINDINGS: Brain: Large area of cortically based infarction in the lateral left frontal lobe, MCA branch distribution. Minimal patchy left caudate acute infarcts. Petechial hemorrhage is present. Remote cortically based infarct affecting the right occipital lobe. Chronic lacunar infarcts in the bilateral deep gray nuclei. Chronic small right cerebellar infarct. Confluent chronic small vessel ischemic gliosis in the deep white matter. Brain volume is normal. Chronic micro hemorrhages which are mainly peripheral but in areas that were likely affected by prior infarction. No hydrocephalus, collection, or masslike finding. Vascular: Preserved flow voids. Skull and upper cervical spine: Normal marrow signal Sinuses/Orbits: Nasopharyngeal and sinus opacification in the setting of intubation. Bilateral cataract resection. IMPRESSION: 1. Large acute left MCA branch infarct with petechial hemorrhage. Minimal involvement in the left caudate head. 2. Chronic small vessel disease with chronic lacunar infarcts. Prior right PCA territory infarct. Electronically Signed   By: Marnee Spring M.D.   On: 01/29/2021 05:29   IR CT Head Ltd  Result Date: 01/30/2021 INDICATION: New onset aphasia with right-sided weakness. Occluded superior division of the left middle cerebral artery proximally on CT angiogram of the head and neck. EXAM: 1. EMERGENT LARGE VESSEL OCCLUSION THROMBOLYSIS (anterior CIRCULATION) COMPARISON:  CT angiogram of the head and neck of January 28, 2021. MEDICATIONS: Ancef 2 g IV antibiotic was administered within 1 hour of the procedure. ANESTHESIA/SEDATION: General anesthesia. CONTRAST:  Omnipaque 300 55  mL. FLUOROSCOPY TIME:  Fluoroscopy Time: 20 minutes 18 seconds (1160 mGy). COMPLICATIONS: None immediate. TECHNIQUE: Emergent consent was signed to physicians. No family members or next  of kin were available on phone or physically. Patient was unable to provide consent due to his medical condition. The patient was then put under general anesthesia by the Department of Anesthesiology at [facility]. The right groin was prepped and draped in the usual sterile fashion. Thereafter using modified Seldinger technique, transfemoral access into the right common femoral artery was obtained without difficulty. Over a 0.035 inch guidewire an 8 [place] Pinnacle 25 cm sheath was inserted. Through this, and also over a 0.035 inch guidewire a 5 [place] JB 1 catheter was advanced to the aortic arch region and selectively positioned in the left common carotid artery. FINDINGS: The left common carotid arteriogram demonstrates the left external carotid artery and its major branches to be widely patent. The left internal carotid artery at the bulb has mild narrowing proximally. More distally the vessel is seen to opacify to the cranial skull base. The petrous, cavernous and supraclinoid segments are patent with focal area of mild stenosis in the caval segment. The left anterior cerebral artery opacifies normally into the capillary and venous phases. The left middle cerebral artery M1 segment is widely patent. The inferior division is normal into the capillary and venous phases. The superior division demonstrates a prominent side branch occlusion proximally. PROCEDURE: The diagnostic JB 1 catheter in the left common carotid artery was then exchanged over a 0.035 inch 300 cm Rosen exchange guidewire for an 087 balloon guide catheter which had been prepped with 50% contrast and 50% heparinized saline infusion. Guidewire was removed. Good aspiration obtained from the hub of the balloon guide catheter. Gentle control arteriogram performed through this demonstrated no evidence of spasms, dissections or of intraluminal filling defects. Over a 0.035 Roadrunner guidewire, this was then advanced to the distal 1/3 of the  left internal carotid artery. The guidewire was removed. Again good aspiration obtained from the hub of the balloon guide catheter. Control arteriogram performed showed no evidence of spasms, dissections or of intraluminal filling defects. Over a 0.014 inch standard Synchro micro guidewire with a J-tip configuration the combination of an 055 Zoom aspiration and 136 cm catheter inside of which was a 160 cm 021 Trevo ProVue microcatheter was advanced to the supraclinoid left ICA. The micro guidewire was then gently maneuvered with a torque device into the occluded segment of the left middle cerebral artery superior division into the distal M3 region followed by the microcatheter. The guidewire was removed. Good aspiration obtained from the hub of the microcatheter. Gentle control arteriogram performed through the microcatheter demonstrated safe position of the tip of the microcatheter with antegrade flow of contrast. This was then connected to continuous heparin saline infusion. A 4 mm x 40 mm Solitaire X retrieval device was then advanced to the distal end of the microcatheter. O ring on the delivery microcatheter was loosened. With slight forward gentle traction with the right hand on the delivery micro guidewire with the left hand the delivery microcatheter was retrieved deploying the retrieval device the proximal end of which was just proximal to the occluded inferior division. 055 Zoom aspiration catheter was advanced and engaged at the origin of the occluded branch. With proximal flow arrest in the left internal carotid artery, and with constant aspiration being applied the hub of 055 aspiration catheter with the Penumbra aspiration device, and a 20 mL syringe at  the hub of the balloon guide catheter in the left internal carotid artery for approximately 2-1/2 minutes, the combination of the retrieval device, the microcatheter, and the Zoom aspiration catheter was retrieved and removed. Flow arrest was reversed  in the left internal carotid artery. A control arteriogram performed through the balloon guide in the proximal left internal carotid artery demonstrated moderate spasm in the mid cervical left ICA. A 3 mm x 1 mm clot was noted entangled in the retrieval device. Wide patency was now noted of the previously occluded superior division inferior branch achieving a TICI 3 revascularization of the left middle cerebral artery distribution. The left anterior cerebral artery remained unchanged widely patent. Spasm noted at the distal end of the left internal carotid artery gradually resolved after 4 aliquots of 25 mcg of nitroglycerin intra-arterially. The balloon guide was then retrieved into the left common carotid artery. A final control arteriogram performed through the balloon guide catheter demonstrated near complete resolution of the previous noted spasm in the left internal carotid artery distally. The left MCA and ACA distributions continued to show complete revascularization. The balloon guide was then removed. The 8 French sheath was removed with hemostasis achieved with manual compression for approximately 20 minutes. Distal pulses remained Dopplerable in both feet unchanged. A CT of the brain demonstrated no evidence of intracranial hemorrhages. Patient was left intubated waiting the results of the COVID-19 test. IMPRESSION: Status post endovascular complete revascularization of occluded prominent side branch of the superior division of the left middle cerebral artery with 1 pass with a 4 mm x 40 mm Solitaire X retrieval device and contact aspiration achieving a TICI 3 revascularization. PLAN: Follow-up as per referring MD. Electronically Signed   By: Julieanne Cotton M.D.   On: 01/29/2021 13:05   DG CHEST PORT 1 VIEW  Result Date: 01/29/2021 CLINICAL DATA:  Endotracheal tube placement EXAM: PORTABLE CHEST 1 VIEW COMPARISON:  08/07/2018 FINDINGS: Endotracheal tube placed with tip measuring 3.6 cm above the  carina. Enteric tube placed with tip not visualized off the field of view but below the left hemidiaphragm. Cardiac enlargement. Small left pleural effusion. Atelectasis or infiltration in both lung bases. No pneumothorax. Calcification of the aorta. IMPRESSION: Appliances appear in satisfactory position. Probable small left pleural effusion with atelectasis or infiltration in both lung bases. Electronically Signed   By: Burman Nieves M.D.   On: 01/29/2021 02:49   DG Abd Portable 1V  Result Date: 01/28/2021 CLINICAL DATA:  OG tube placement. EXAM: PORTABLE ABDOMEN - 1 VIEW COMPARISON:  None. FINDINGS: Tip and side port of the enteric tube below the diaphragm in the stomach. Air throughout nondilated small and large bowel in the upper abdomen. IMPRESSION: Tip and side port of the enteric tube below the diaphragm in the stomach. Electronically Signed   By: Narda Rutherford M.D.   On: 01/28/2021 18:02   EEG adult  Result Date: 02/02/2021 Charlsie Quest, MD     02/02/2021  8:41 AM Patient Name: Antwine Agosto MRN: [id number] Epilepsy Attending: Charlsie Quest Referring Physician/Provider: Dr Marvel Plan Date: 02/01/2021 Duration: 29.75mins Patient history: Per RN and speech therapies, patient this morning had acute onset left forehead headache, CT repeat showed a slight increase of hemorrhagic conversion on the left.  However, patient later has no recollection of the headache and CT repeat.  EEG to rule out a seizure episode.  Level of alertness: Awake, asleep AEDs during EEG study: None Technical aspects: This EEG study was done with scalp  electrodes positioned according to the 10-20 International system of electrode placement. Electrical activity was acquired at a sampling rate of 500Hz  and reviewed with a high frequency filter of 70Hz  and a low frequency filter of 1Hz . EEG data were recorded continuously and digitally stored. Description: The posterior dominant rhythm consists of 9 Hz activity of moderate  voltage (25-35 uV) seen predominantly in posterior head regions, symmetric and reactive to eye opening and eye closing. Sleep was characterized by vertex waves, sleep spindles (12 to 14 Hz), maximal frontocentral region. EEG showed continuous left frontotemporal 3 to 6 Hz theta-delta slowing. Hyperventilation and photic stimulation were not performed.   ABNORMALITY - Continuous slow,  left frontotemporal region IMPRESSION: This study is suggestive of cortical dysfunction arising from left frontotemporal region likely secondary to underlying structural abnormality/stroke. No seizures or epileptiform discharges were seen throughout the recording. Charlsie Quest   ECHOCARDIOGRAM COMPLETE  Result Date: 01/29/2021    ECHOCARDIOGRAM REPORT   Patient Name:   SHONTEZ SERMON Date of Exam: 01/29/2021 Medical Rec #:  [id number]     Height:       66.0 in Accession #:    [id number]    Weight:       202.2 lb Date of Birth:  31-Dec-1955    BSA:          2.009 m Patient Age:    64 years      BP:           143/82 mmHg Patient Gender: M             HR:           98 bpm. Exam Location:  Inpatient Procedure: 2D Echo, Cardiac Doppler, Color Doppler and Intracardiac            Opacification Agent Indications:    Stroke  History:        Patient has no prior history of Echocardiogram examinations.                 Arrythmias:Atrial Fibrillation; Risk Factors:Former Smoker. H/O                 CVA.  Sonographer:    Ross Ludwig RDCS (AE) Referring Phys: [phone] [facility]  Sonographer Comments: Suboptimal apical window. IMPRESSIONS  1. Left ventricular ejection fraction, by estimation, is 50 to 55%. The left ventricle has low normal function. The left ventricle demonstrates regional wall motion abnormalities (lateral wall hypokinesis). There is mild concentric left ventricular hypertrophy. Left ventricular diastolic parameters are consistent with Grade II diastolic dysfunction (pseudonormalization).  2. Right ventricular systolic  function is normal. The right ventricular size is mildly enlarged. There is mildly elevated pulmonary artery systolic pressure. The estimated right ventricular systolic pressure is 40.5 mmHg.  3. Left atrial size was mildly dilated.  4. Right atrial size was severely dilated.  5. The mitral valve is grossly normal. Mild mitral valve regurgitation. The mean mitral valve gradient is 3.0 mmHg with average heart rate of 89 bpm.  6. The aortic valve is tricuspid. There is mild calcification of the aortic valve. There is mild thickening of the aortic valve. Aortic valve regurgitation is not visualized. Mild aortic valve sclerosis is present, with no evidence of aortic valve stenosis.  7. The inferior vena cava is normal in size with <50% respiratory variability, suggesting right atrial pressure of 8 mmHg. FINDINGS  Left Ventricle: Left ventricular ejection fraction, by estimation, is 50 to 55%. The left ventricle has  low normal function. The left ventricle demonstrates regional wall motion abnormalities. Definity contrast agent was given IV to delineate the left ventricular endocardial borders. The left ventricular internal cavity size was normal in size. There is mild concentric left ventricular hypertrophy. Left ventricular diastolic parameters are consistent with Grade II diastolic dysfunction (pseudonormalization).  LV Wall Scoring: The mid and distal lateral wall, posterior wall, mid anterolateral segment, and apical anterior segment are hypokinetic. The basal anterolateral segment is normal. Right Ventricle: The right ventricular size is mildly enlarged. No increase in right ventricular wall thickness. Right ventricular systolic function is normal. There is mildly elevated pulmonary artery systolic pressure. The tricuspid regurgitant velocity is 2.85 m/s, and with an assumed right atrial pressure of 8 mmHg, the estimated right ventricular systolic pressure is 40.5 mmHg. Left Atrium: Left atrial size was mildly  dilated. Right Atrium: Right atrial size was severely dilated. Pericardium: There is no evidence of pericardial effusion. Mitral Valve: The mitral valve is grossly normal. Mild mitral valve regurgitation. MV peak gradient, 6.8 mmHg. The mean mitral valve gradient is 3.0 mmHg with average heart rate of 89 bpm. Tricuspid Valve: The tricuspid valve is normal in structure. Tricuspid valve regurgitation is mild . No evidence of tricuspid stenosis. Aortic Valve: The aortic valve is tricuspid. There is mild calcification of the aortic valve. There is mild thickening of the aortic valve. Aortic valve regurgitation is not visualized. Mild aortic valve sclerosis is present, with no evidence of aortic valve stenosis. Aortic valve mean gradient measures 5.0 mmHg. Aortic valve peak gradient measures 10.1 mmHg. Aortic valve area, by VTI measures 1.59 cm. Pulmonic Valve: The pulmonic valve was normal in structure. Pulmonic valve regurgitation is not visualized. No evidence of pulmonic stenosis. Aorta: The aortic root and ascending aorta are structurally normal, with no evidence of dilitation. Venous: The inferior vena cava is normal in size with less than 50% respiratory variability, suggesting right atrial pressure of 8 mmHg. IAS/Shunts: The atrial septum is grossly normal.  LEFT VENTRICLE PLAX 2D LVIDd:         5.10 cm  Diastology LVIDs:         3.60 cm  LV e' medial:    7.40 cm/s LV PW:         1.60 cm  LV E/e' medial:  15.1 LV IVS:        1.80 cm  LV e' lateral:   7.78 cm/s LVOT diam:     1.90 cm  LV E/e' lateral: 14.4 LV SV:         44 LV SV Index:   22 LVOT Area:     2.84 cm  RIGHT VENTRICLE             IVC RV Basal diam:  4.30 cm     IVC diam: 2.30 cm RV S prime:     12.30 cm/s TAPSE (M-mode): 1.4 cm LEFT ATRIUM             Index       RIGHT ATRIUM           Index LA diam:        3.60 cm 1.79 cm/m  RA Area:     36.50 cm LA Vol (A2C):   73.0 ml 36.34 ml/m RA Volume:   156.00 ml 77.65 ml/m LA Vol (A4C):   73.0 ml 36.34  ml/m LA Biplane Vol: 74.3 ml 36.98 ml/m  AORTIC VALVE AV Area (Vmax):    1.66 cm AV Area (  Vmean):   1.40 cm AV Area (VTI):     1.59 cm AV Vmax:           159.00 cm/s AV Vmean:          108.000 cm/s AV VTI:            0.275 m AV Peak Grad:      10.1 mmHg AV Mean Grad:      5.0 mmHg LVOT Vmax:         93.20 cm/s LVOT Vmean:        53.200 cm/s LVOT VTI:          0.154 m LVOT/AV VTI ratio: 0.56  AORTA Ao Root diam: 3.10 cm Ao Asc diam:  3.60 cm MITRAL VALVE                TRICUSPID VALVE MV Area (PHT): 4.41 cm     TR Peak grad:   32.5 mmHg MV Area VTI:   1.85 cm     TR Vmax:        285.00 cm/s MV Peak grad:  6.8 mmHg MV Mean grad:  3.0 mmHg     SHUNTS MV Vmax:       1.30 m/s     Systemic VTI:  0.15 m MV Vmean:      75.2 cm/s    Systemic Diam: 1.90 cm MV Decel Time: 172 msec MV E velocity: 112.00 cm/s MV A velocity: 121.00 cm/s MV E/A ratio:  0.93 Riley LamMahesh Chandrasekhar MD Electronically signed by Riley LamMahesh Chandrasekhar MD Signature Date/Time: 01/29/2021/12:28:37 PM    Final    IR PERCUTANEOUS ART THROMBECTOMY/INFUSION INTRACRANIAL INC DIAG ANGIO  Result Date: 01/30/2021 INDICATION: New onset aphasia with right-sided weakness. Occluded superior division of the left middle cerebral artery proximally on CT angiogram of the head and neck. EXAM: 1. EMERGENT LARGE VESSEL OCCLUSION THROMBOLYSIS (anterior CIRCULATION) COMPARISON:  CT angiogram of the head and neck of January 28, 2021. MEDICATIONS: Ancef 2 g IV antibiotic was administered within 1 hour of the procedure. ANESTHESIA/SEDATION: General anesthesia. CONTRAST:  Omnipaque 300 55 mL. FLUOROSCOPY TIME:  Fluoroscopy Time: 20 minutes 18 seconds (1160 mGy). COMPLICATIONS: None immediate. TECHNIQUE: Emergent consent was signed to physicians. No family members or next of kin were available on phone or physically. Patient was unable to provide consent due to his medical condition. The patient was then put under general anesthesia [facility]. The right groin was prepped and draped in the usual sterile fashion. Thereafter using modified Seldinger technique, transfemoral access into the right common femoral artery was obtained without difficulty. Over a 0.035 inch guidewire an 8 [place] Pinnacle 25 cm sheath was inserted. Through this, and also over a 0.035 inch guidewire a 5 [place] JB 1 catheter was advanced to the aortic arch region and selectively positioned in the left common carotid artery. FINDINGS: The left common carotid arteriogram demonstrates the left external carotid artery and its major branches to be widely patent. The left internal carotid artery at the bulb has mild narrowing proximally. More distally the vessel is seen to opacify to the cranial skull base. The petrous, cavernous and supraclinoid segments are patent with focal area of mild stenosis in the caval segment. The left anterior cerebral artery opacifies normally into the capillary and venous phases. The left middle cerebral artery M1 segment is widely patent. The inferior division is normal into the capillary and venous phases. The superior division demonstrates a prominent  side branch occlusion proximally. PROCEDURE: The diagnostic JB 1 catheter in the left common carotid artery was then exchanged over a 0.035 inch 300 cm Rosen exchange guidewire for an 087 balloon guide catheter which had been prepped with 50% contrast and 50% heparinized saline infusion. Guidewire was removed. Good aspiration obtained from the hub of the balloon guide catheter. Gentle control arteriogram performed through this demonstrated no evidence of spasms, dissections or of intraluminal filling defects. Over a 0.035 Roadrunner guidewire, this was then advanced to the distal 1/3 of the left internal carotid artery. The guidewire was removed. Again good aspiration obtained from the hub of the balloon guide catheter. Control arteriogram performed showed no evidence of spasms, dissections or of  intraluminal filling defects. Over a 0.014 inch standard Synchro micro guidewire with a J-tip configuration the combination of an 055 Zoom aspiration and 136 cm catheter inside of which was a 160 cm 021 Trevo ProVue microcatheter was advanced to the supraclinoid left ICA. The micro guidewire was then gently maneuvered with a torque device into the occluded segment of the left middle cerebral artery superior division into the distal M3 region followed by the microcatheter. The guidewire was removed. Good aspiration obtained from the hub of the microcatheter. Gentle control arteriogram performed through the microcatheter demonstrated safe position of the tip of the microcatheter with antegrade flow of contrast. This was then connected to continuous heparin saline infusion. A 4 mm x 40 mm Solitaire X retrieval device was then advanced to the distal end of the microcatheter. O ring on the delivery microcatheter was loosened. With slight forward gentle traction with the right hand on the delivery micro guidewire with the left hand the delivery microcatheter was retrieved deploying the retrieval device the proximal end of which was just proximal to the occluded inferior division. 055 Zoom aspiration catheter was advanced and engaged at the origin of the occluded branch. With proximal flow arrest in the left internal carotid artery, and with constant aspiration being applied the hub of 055 aspiration catheter with the Penumbra aspiration device, and a 20 mL syringe at the hub of the balloon guide catheter in the left internal carotid artery for approximately 2-1/2 minutes, the combination of the retrieval device, the microcatheter, and the Zoom aspiration catheter was retrieved and removed. Flow arrest was reversed in the left internal carotid artery. A control arteriogram performed through the balloon guide in the proximal left internal carotid artery demonstrated moderate spasm in the mid cervical left ICA. A 3 mm x 1 mm  clot was noted entangled in the retrieval device. Wide patency was now noted of the previously occluded superior division inferior branch achieving a TICI 3 revascularization of the left middle cerebral artery distribution. The left anterior cerebral artery remained unchanged widely patent. Spasm noted at the distal end of the left internal carotid artery gradually resolved after 4 aliquots of 25 mcg of nitroglycerin intra-arterially. The balloon guide was then retrieved into the left common carotid artery. A final control arteriogram performed through the balloon guide catheter demonstrated near complete resolution of the previous noted spasm in the left internal carotid artery distally. The left MCA and ACA distributions continued to show complete revascularization. The balloon guide was then removed. The 8 French sheath was removed with hemostasis achieved with manual compression for approximately 20 minutes. Distal pulses remained Dopplerable in both feet unchanged. A CT of the brain demonstrated no evidence of intracranial hemorrhages. Patient was left intubated waiting the results of the COVID-19 test.  IMPRESSION: Status post endovascular complete revascularization of occluded prominent side branch of the superior division of the left middle cerebral artery with 1 pass with a 4 mm x 40 mm Solitaire X retrieval device and contact aspiration achieving a TICI 3 revascularization. PLAN: Follow-up as per referring MD. Electronically Signed   By: Julieanne Cotton M.D.   On: 01/29/2021 13:05   CT HEAD CODE STROKE WO CONTRAST  Result Date: 01/28/2021 CLINICAL DATA:  Right-sided weakness EXAM: CT ANGIOGRAPHY HEAD AND NECK CT PERFUSION BRAIN TECHNIQUE: Multidetector CT imaging of the head and neck was performed using the standard protocol during bolus administration of intravenous contrast. Multiplanar CT image reconstructions and MIPs were obtained to evaluate the vascular anatomy. Carotid stenosis measurements  (when applicable) are obtained utilizing NASCET criteria, using the distal internal carotid diameter as the denominator. Multiphase CT imaging of the brain was performed following IV bolus contrast injection. Subsequent parametric perfusion maps were calculated using RAPID software. CONTRAST:  80 mL Omnipaque 350 COMPARISON:  Report of CTA from 2015 only. Images are unavailable at the time of dictation. FINDINGS: CT HEAD Brain: There is no acute intracranial hemorrhage or mass effect. There is hypoattenuation with loss of gray-white differentiation involving left insula and left frontoparietal lobes (pre and postcentral gyri). There is likely a chronic small left frontal cortical infarct involving the precentral gyrus. Chronic right occipitotemporal infarct. There are age-indeterminate but probably chronic small vessel infarcts of basal ganglia and central white matter bilaterally. Additional patchy and confluent areas of low-attenuation in the supratentorial white matter nonspecific but may reflect moderate chronic microvascular ischemic changes. Vascular: No definite hyperdense vessel. There may be focal hyperdensity of distal left M2 branch within the sylvian fissure, which would correspond to CTA findings. Skull: Calvarium is unremarkable. Sinuses/Orbits: No acute finding. Other: None. Review of the MIP images confirms the above findings ASPECTS [facility] Stroke Program Early CT Score) - Ganglionic level infarction (caudate, lentiform nuclei, internal capsule, insula, M1-M3 cortex): 5 - Supraganglionic infarction (M4-M6 cortex): 2 Total score (0-10 with 10 being normal): 7 CTA NECK Aortic arch: Great vessel origins are patent with mild calcified plaque. Right carotid system: Patent. Calcified plaque at the bifurcation and along the proximal internal carotid with less than 50% stenosis. Retropharyngeal course of the ICA. Left carotid system: Patent. Mixed plaque at the bifurcation and proximal internal carotid  without stenosis. Retropharyngeal course of the ICA. Vertebral arteries: Patent and codominant.  No stenosis. Skeleton: Mild degenerative changes of the cervical spine. Other neck: Diffusely enlarged but homogeneous appearing thyroid. No ultrasound follow-up is recommended by current guidelines. Upper chest: Mild centrilobular emphysema. Review of the MIP images confirms the above findings CTA HEAD Anterior circulation: Intracranial internal carotid arteries are patent with calcified plaque along the cavernous and proximal supraclinoid portions. There is moderate to marked stenosis of the proximal left cavernous segment. Additional areas of moderate stenosis bilaterally. Left M1 MCA is patent. There is distal left M2 branch occlusion within the posterior sylvian fissure. Right middle cerebral artery is patent. Anterior cerebral arteries are patent. Anterior communicating artery is present. Posterior circulation: Intracranial vertebral arteries are patent. Basilar artery is patent. Major cerebellar artery origins are patent. Left posterior cerebral artery is patent. There is significantly decreased flow within the right PCA beginning at the P2 segment probably related to prior infarct. Venous sinuses: Patent as allowed by contrast bolus timing. Review of the MIP images confirms the above findings CT Brain Perfusion Findings: CBF (<30%) Volume: 3mL Perfusion (Tmax>6.0s) volume:  39mL Mismatch Volume: 28mL Infarction Location: Left MCA territory IMPRESSION: No acute intracranial hemorrhage. Acute left MCA territory infarction with ASPECT score of 7. Distal left M2 MCA branch occlusion. Perfusion imaging demonstrates core infarction of 11 mL congruent with noncontrast CT findings. There is a calculated penumbra of 28 mL. Additional chronic findings detailed above including infarcts and microvascular ischemic changes. Plaque without hemodynamically significant stenosis at the ICA origins. Calcified plaque along the  intracranial internal carotid arteries. Suspect moderate to marked stenosis of the proximal left cavernous segment. Additional areas of moderate stenosis bilaterally. Significantly decreased flow within the right PCA beginning at the P2 segment probably related to prior infarct. Emphysema. Mild enlargement of the main pulmonary artery, which could indicate pulmonary arterial hypertension. Initial results were communicated to Dr. Amada Jupiter at 12:27 pm on 01/28/2021 by text page via the [facility] messaging system. Electronically Signed   By: Guadlupe Spanish M.D.   On: 01/28/2021 12:44   CT ANGIO HEAD NECK W WO CM W PERF (CODE STROKE)  Result Date: 01/28/2021 CLINICAL DATA:  Right-sided weakness EXAM: CT ANGIOGRAPHY HEAD AND NECK CT PERFUSION BRAIN TECHNIQUE: Multidetector CT imaging of the head and neck was performed using the standard protocol during bolus administration of intravenous contrast. Multiplanar CT image reconstructions and MIPs were obtained to evaluate the vascular anatomy. Carotid stenosis measurements (when applicable) are obtained utilizing NASCET criteria, using the distal internal carotid diameter as the denominator. Multiphase CT imaging of the brain was performed following IV bolus contrast injection. Subsequent parametric perfusion maps were calculated using RAPID software. CONTRAST:  80 mL Omnipaque 350 COMPARISON:  Report of CTA from 2015 only. Images are unavailable at the time of dictation. FINDINGS: CT HEAD Brain: There is no acute intracranial hemorrhage or mass effect. There is hypoattenuation with loss of gray-white differentiation involving left insula and left frontoparietal lobes (pre and postcentral gyri). There is likely a chronic small left frontal cortical infarct involving the precentral gyrus. Chronic right occipitotemporal infarct. There are age-indeterminate but probably chronic small vessel infarcts of basal ganglia and central white matter bilaterally. Additional patchy and  confluent areas of low-attenuation in the supratentorial white matter nonspecific but may reflect moderate chronic microvascular ischemic changes. Vascular: No definite hyperdense vessel. There may be focal hyperdensity of distal left M2 branch within the sylvian fissure, which would correspond to CTA findings. Skull: Calvarium is unremarkable. Sinuses/Orbits: No acute finding. Other: None. Review of the MIP images confirms the above findings ASPECTS [facility] Stroke Program Early CT Score) - Ganglionic level infarction (caudate, lentiform nuclei, internal capsule, insula, M1-M3 cortex): 5 - Supraganglionic infarction (M4-M6 cortex): 2 Total score (0-10 with 10 being normal): 7 CTA NECK Aortic arch: Great vessel origins are patent with mild calcified plaque. Right carotid system: Patent. Calcified plaque at the bifurcation and along the proximal internal carotid with less than 50% stenosis. Retropharyngeal course of the ICA. Left carotid system: Patent. Mixed plaque at the bifurcation and proximal internal carotid without stenosis. Retropharyngeal course of the ICA. Vertebral arteries: Patent and codominant.  No stenosis. Skeleton: Mild degenerative changes of the cervical spine. Other neck: Diffusely enlarged but homogeneous appearing thyroid. No ultrasound follow-up is recommended by current guidelines. Upper chest: Mild centrilobular emphysema. Review of the MIP images confirms the above findings CTA HEAD Anterior circulation: Intracranial internal carotid arteries are patent with calcified plaque along the cavernous and proximal supraclinoid portions. There is moderate to marked stenosis of the proximal left cavernous segment. Additional areas of moderate stenosis bilaterally. Left  M1 MCA is patent. There is distal left M2 branch occlusion within the posterior sylvian fissure. Right middle cerebral artery is patent. Anterior cerebral arteries are patent. Anterior communicating artery is present. Posterior  circulation: Intracranial vertebral arteries are patent. Basilar artery is patent. Major cerebellar artery origins are patent. Left posterior cerebral artery is patent. There is significantly decreased flow within the right PCA beginning at the P2 segment probably related to prior infarct. Venous sinuses: Patent as allowed by contrast bolus timing. Review of the MIP images confirms the above findings CT Brain Perfusion Findings: CBF (<30%) Volume: 31mL Perfusion (Tmax>6.0s) volume: 52mL Mismatch Volume: 72mL Infarction Location: Left MCA territory IMPRESSION: No acute intracranial hemorrhage. Acute left MCA territory infarction with ASPECT score of 7. Distal left M2 MCA branch occlusion. Perfusion imaging demonstrates core infarction of 11 mL congruent with noncontrast CT findings. There is a calculated penumbra of 28 mL. Additional chronic findings detailed above including infarcts and microvascular ischemic changes. Plaque without hemodynamically significant stenosis at the ICA origins. Calcified plaque along the intracranial internal carotid arteries. Suspect moderate to marked stenosis of the proximal left cavernous segment. Additional areas of moderate stenosis bilaterally. Significantly decreased flow within the right PCA beginning at the P2 segment probably related to prior infarct. Emphysema. Mild enlargement of the main pulmonary artery, which could indicate pulmonary arterial hypertension. Initial results were communicated to Dr. Amada Jupiter at 12:27 pm on 01/28/2021 by text page via the [facility] messaging system. Electronically Signed   By: Guadlupe Spanish M.D.   On: 01/28/2021 12:44     PHYSICAL EXAM  General exam Pleasant middle-aged African-American male HEENT-  Normocephalic, no lesions, without obvious abnormality.  Normal external eye and conjunctiva.   Cardiovascular- regular rhythm, on tele, tachycardic    Neurologic Exam: Sleepy, however easily arousable with eyes open, expressive aphasia,  able to repeat short sentences with severe dysarthric voice.  Follows simple commands, comprehension seems intact.  No gaze palsy, tracking bilaterally, visual field full, right facial droop, tongue protrusion to the right.  Right upper extremity is 2/5, right lower extremity 3/5.  Left upper and lower extremity 5/5.  Sensation decreased on the right.  Left finger-to-nose intact.  Gait not tested.   ASSESSMENT/PLAN Mr. Burdett Tin is a 65 y.o. male with a past medical history significant for previous stroke, DVT on Xarelto, HTN, CAD/MI, HLD who presented to [facility] after being found by his ex-wife with right-sided weakness and aphasia. On arrival, imaging showed a distal left M2 MCA branch occlusion with a large area of penumbra and the CT perfusion is favorable for intervention and was taken for emergent thrombectomy. More recent MR this morning showed a large acute left MCA branch infarct with petechial hemorrhage.  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 fib LDL 94  HgbA1c 5.9 Was on aspirin 81, resume aspirin 81 for stroke prevention.  No DAPT at this time given hemorrhagic  transformation. 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 Pearlean Brownie 01/30/21 note)  Hypertension Stable Continue lisinopril, amlodipine BP goal less than 160 given hemorrhagic conversion Long-term BP goal normotensive  Hyperlipidemia LDL 94, goal < 70 Add Lipitor 40mg  daily Continue statin at discharge  Dysphagia  Passed swallow on dys 1 and thin Speech on board Avoid aspiration  Hospital day # 6    Marvel Plan, MD PhD Stroke Neurology 02/03/2021 2:56 PM   To contact Stroke Continuity provider, please refer to [email or web address]. After hours, contact General Neurology

## 2021-02-03 NOTE — Progress Notes (Signed)
Physical Therapy Treatment Patient Details Name: Kenneth Yu MRN: [id number] DOB: [birth year] Today's Date: 02/03/2021    History of Present Illness Pt is a 65 yo male s/p R side weakness, aphasia and ?R hemianopsia.  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 7/8 syncope-- Repeat CT: Evolving recent left MCA territory infarction/hemorrhagic conversion. Also having runs of V-Tach.PMHx: HTN, HLD.    PT Comments    Patient understanding simple commands (and even multi-step) and showing good problem-solving as getting himself up to sitting at EOB (exit to his left with use of rail). Progressed standing, pre-gait and gait this session with pt needing less assist overall for activities (still requires +2 for safety and confidence).     Follow Up Recommendations  CIR     Equipment Recommendations  Other (comment) (defer to next venue of care)    Recommendations for Other Services       Precautions / Restrictions Precautions Precautions: Fall    Mobility  Bed Mobility Overal bed mobility: Needs Assistance Bed Mobility: Supine to Sit;Sit to Supine     Supine to sit: Min assist;HOB elevated Sit to supine: Min assist   General bed mobility comments: HOB elevated 20 degrees; +use of rail to exit bed to his left, assist to bring RUE in front of his body only; return to supine to go to CT pt using LLE to assist RLE into bed with additional min assist    Transfers Overall transfer level: Needs assistance Equipment used: 2 person hand held assist Transfers: Sit to/from Stand Sit to Stand: +2 physical assistance;Min assist         General transfer comment: x2; tendency to lean to his right with min assist to correct  Ambulation/Gait Ambulation/Gait assistance: Mod assist;+2 physical assistance   Assistive device: 2 person hand held assist Gait Pattern/deviations: Step-to pattern;Decreased step length -  right;Decreased step length - left;Decreased stance time - right;Decreased weight shift to right;Decreased weight shift to left;Decreased stride length;Decreased dorsiflexion - right Gait velocity: reduced   General Gait Details: pt tends to lean to his right with assist and cues to shift over LLE to advance RLE; Rt knee supported or otherwise buckles in stance; pre-gait with forward and back ward stepping; ambulated to his left/sideways to [facility] with pt able to advance RLE himself, but occasionally adducts too much (assist for placement, balance and proper wt-shifting to [employer] each LE)   Stairs             Wheelchair Mobility    Modified Rankin (Stroke Patients Only) Modified Rankin (Stroke Patients Only) Pre-Morbid Rankin Score: No symptoms Modified Rankin: Severe disability     Balance Overall balance assessment: Needs assistance Sitting-balance support: Feet supported;No upper extremity supported Sitting balance-Leahy Scale: Fair Sitting balance - Comments: able to sit statically EOB without external assist with supervision Postural control: Posterior lean;Right lateral lean Standing balance support: During functional activity;Bilateral upper extremity supported;Single extremity supported Standing balance-Leahy Scale: Poor Standing balance comment: reliant on external support                            Cognition Arousal/Alertness: Awake/alert Behavior During Therapy: Flat affect Overall Cognitive Status: Difficult to assess                     Current Attention Level: Sustained (>sustained NT)   Following Commands: Follows one step commands consistently  Awareness: Emergent Problem Solving: Decreased initiation;Requires verbal cues General Comments: pt 100% accurate with yes/no questions (that could be assessed with confidence); folloowing all single step commands; good problem-solving to move to sitting EOB      Exercises General Exercises -  Lower Extremity Ankle Circles/Pumps: 10 reps;PROM;Right Heel Slides: AAROM;Right;10 reps (assisted flexion; resisted extension) Hip ABduction/ADduction: AAROM;Right;10 reps    General Comments General comments (skin integrity, edema, etc.): on room air with sats 93-96%      Pertinent Vitals/Pain Pain Assessment: Faces Faces Pain Scale: Hurts whole lot Pain Location: rt posterior heel/calcaneus Pain Descriptors / Indicators: Grimacing;Guarding;Moaning Pain Intervention(s): Limited activity within patient's tolerance;Monitored during session;Repositioned;Other (comment) (applied mepilex foam; floated heel at end of session)    Home Living                      Prior Function            PT Goals (current goals can now be found in the care plan section) Acute Rehab PT Goals Patient Stated Goal: agreeable to session Time For Goal Achievement: 02/12/21 Potential to Achieve Goals: Good Progress towards PT goals: Progressing toward goals    Frequency    Min 4X/week      PT Plan Current plan remains appropriate    Co-evaluation              AM-PAC PT "6 Clicks" Mobility   Outcome Measure  Help needed turning from your back to your side while in a flat bed without using bedrails?: A Lot Help needed moving from lying on your back to sitting on the side of a flat bed without using bedrails?: A Lot Help needed moving to and from a bed to a chair (including a wheelchair)?: A Lot Help needed standing up from a chair using your arms (e.g., wheelchair or bedside chair)?: A Lot Help needed to walk in hospital room?: Total Help needed climbing 3-5 steps with a railing? : Total 6 Click Score: 10    End of Session Equipment Utilized During Treatment: Gait belt Activity Tolerance: Patient tolerated treatment well Patient left: with call bell/phone within reach;in bed;with bed alarm set (preparing to go to CT) Nurse Communication: Mobility status;Other (comment) (rt  heel painful; foam placed and floated with pillow) PT Visit Diagnosis: Other abnormalities of gait and mobility (R26.89);Muscle weakness (generalized) (M62.81);Hemiplegia and hemiparesis;Unsteadiness on feet (R26.81);Difficulty in walking, not elsewhere classified (R26.2);Other symptoms and signs involving the nervous system (R29.898) Hemiplegia - Right/Left: Right Hemiplegia - dominant/non-dominant: Dominant Hemiplegia - caused by: Cerebral infarction     Time: 0912-0949 PT Time Calculation (min) (ACUTE ONLY): 37 min  Charges:  $Neuromuscular Re-education: 23-37 mins                      Jerolyn Center, PT Pager [phone]    Zena Amos 02/03/2021, 10:08 AM

## 2021-02-04 DIAGNOSIS — I6602 Occlusion and stenosis of left middle cerebral artery: Secondary | ICD-10-CM | POA: Diagnosis not present

## 2021-02-04 LAB — CBC
HCT: 46.4 % (ref 39.0–52.0)
Hemoglobin: 15.2 g/dL (ref 13.0–17.0)
MCH: 28.8 pg (ref 26.0–34.0)
MCHC: 32.8 g/dL (ref 30.0–36.0)
MCV: 87.9 fL (ref 80.0–100.0)
Platelets: 181 10*3/uL (ref 150–400)
RBC: 5.28 MIL/uL (ref 4.22–5.81)
RDW: 13.8 % (ref 11.5–15.5)
WBC: 7.2 10*3/uL (ref 4.0–10.5)
nRBC: 0 % (ref 0.0–0.2)

## 2021-02-04 LAB — BASIC METABOLIC PANEL
Anion gap: 9 (ref 5–15)
BUN: 16 mg/dL (ref 8–23)
CO2: 27 mmol/L (ref 22–32)
Calcium: 9.4 mg/dL (ref 8.9–10.3)
Chloride: 106 mmol/L (ref 98–111)
Creatinine, Ser: 1.13 mg/dL (ref 0.61–1.24)
GFR, Estimated: >60 mL/min (ref >60)
Glucose, Bld: 90 mg/dL (ref 70–99)
Potassium: 4.6 mmol/L (ref 3.5–5.1)
Sodium: 142 mmol/L (ref 135–145)

## 2021-02-04 NOTE — Progress Notes (Signed)
Physical Therapy Treatment Patient Details Name: Kenneth Yu MRN: [id number] DOB: 09/10/1955 Today's Date: 02/04/2021    History of Present Illness Pt is a 65 yo male s/p R side weakness, aphasia and ?R hemianopsia.  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 7/8 syncope-- Repeat CT: Evolving recent left MCA territory infarction/hemorrhagic conversion. Also having runs of V-Tach.PMHx: HTN, HLD.    PT Comments    Today's skilled session continued to focus on mobility progression and strengthening. Limited today due pt with HA, however pt still willing to participate and get out of bed. Acute PT to continued during pt's hospital stay.    Follow Up Recommendations  CIR     Equipment Recommendations  Other (comment) (defer to next venue of care)    Precautions / Restrictions Precautions Precautions: Fall Restrictions Weight Bearing Restrictions: No    Mobility  Bed Mobility Overal bed mobility: Needs Assistance Bed Mobility: Supine to Sit     Supine to sit: Min assist;HOB elevated     General bed mobility comments: with HOB ~20 degrees and rail used- pt able to advance bil LE off edge of bed with min assist needed to elevate trunk into sitting position at edge of bed. once seated pt able to use bil UEs to scoot fully to edge of bed with min assist at trunk to prevent posterior balance loss.    Transfers Overall transfer level: Needs assistance Equipment used: 2 person hand held assist Transfers: Sit to/from [employer] Sit to Stand: +2 physical assistance;Min assist Stand pivot transfers: Mod assist;+2 physical assistance       General transfer comment: x2; tendency to lean to his right with min assist to correct      Modified Rankin (Stroke Patients Only) Modified Rankin (Stroke Patients Only) Pre-Morbid Rankin Score: No symptoms Modified Rankin: Severe disability     Balance  Overall balance assessment: Needs assistance Sitting-balance support: Feet supported;No upper extremity supported Sitting balance-Leahy Scale: Fair Sitting balance - Comments: able to sit statically EOB without external assist with supervision. once engaged in LE ex's needed min guard to min assist for balance Postural control: Posterior lean;Right lateral lean Standing balance support: During functional activity;Bilateral upper extremity supported;Single extremity supported Standing balance-Leahy Scale: Poor                    Cognition Arousal/Alertness: Awake/alert Behavior During Therapy: Flat affect Overall Cognitive Status: Difficult to assess                     Current Attention Level: Sustained   Following Commands: Follows one step commands consistently     Problem Solving: Decreased initiation;Requires verbal cues General Comments: pt nonverbal. nod's head to yes/no questions and uses guestures to communitcate. Followed all simple commands with session today.      Exercises General Exercises - Lower Extremity Long Arc Quad: AROM;Strengthening;Both;10 reps;Seated Heel Raises: AROM;Strengthening;Both;10 reps;Seated Mini-Sqauts: AROM;Strengthening;Both;10 reps;Seated     Pertinent Vitals/Pain Pain Assessment: 0-10 Faces Pain Scale: Hurts even more Pain Location: headache Pain Descriptors / Indicators: Headache Pain Intervention(s): Limited activity within patient's tolerance;Monitored during session;Patient requesting pain meds-RN notified;RN gave pain meds during session     PT Goals (current goals can now be found in the care plan section) Acute Rehab PT Goals Patient Stated Goal: agreeable to session PT Goal Formulation: With patient Time For Goal Achievement: 02/12/21 Potential to Achieve Goals: Good Progress towards  PT goals: Progressing toward goals    Frequency    Min 4X/week      PT Plan Current plan remains appropriate     AM-PAC PT "6 Clicks" Mobility   Outcome Measure  Help needed turning from your back to your side while in a flat bed without using bedrails?: A Lot Help needed moving from lying on your back to sitting on the side of a flat bed without using bedrails?: A Lot Help needed moving to and from a bed to a chair (including a wheelchair)?: A Lot Help needed standing up from a chair using your arms (e.g., wheelchair or bedside chair)?: A Lot Help needed to walk in hospital room?: Total Help needed climbing 3-5 steps with a railing? : Total 6 Click Score: 10    End of Session Equipment Utilized During Treatment: Gait belt Activity Tolerance: Patient tolerated treatment well Patient left: in chair;with chair alarm set;with call bell/phone within reach Nurse Communication: Mobility status;Other (comment) (request for meds for HA and that condom cath was found off in bed/need for replacement at RN discretion) PT Visit Diagnosis: Other abnormalities of gait and mobility (R26.89);Muscle weakness (generalized) (M62.81);Hemiplegia and hemiparesis;Unsteadiness on feet (R26.81);Difficulty in walking, not elsewhere classified (R26.2);Other symptoms and signs involving the nervous system (R29.898) Hemiplegia - Right/Left: Right Hemiplegia - dominant/non-dominant: Dominant Hemiplegia - caused by: Cerebral infarction     Time: 1218-1238 PT Time Calculation (min) (ACUTE ONLY): 20 min  Charges:  $Therapeutic Activity: 8-22 mins                    Sallyanne Kuster, PTA, CLT Acute Rehab Services Office- [phone] 02/04/21, 1:20 PM    Sallyanne Kuster 02/04/2021, 1:19 PM

## 2021-02-04 NOTE — Plan of Care (Signed)

## 2021-02-04 NOTE — TOC Progression Note (Signed)
Transition of Care [facility]) - Progression Note    Patient Details  Name: Kenneth Yu MRN: [id number] Date of Birth: 24-Feb-1956  [facility] CM/SW Contact  Baldemar Lenis, [place] Phone Number: 02/04/2021, 3:34 PM  Clinical Narrative:   CSW following for discharge to SNF. Patient is pending insurance approval to transition to [employer]. CSW updated MD on barrier to discharge. CSW to continue to follow.    Expected Discharge Plan: Skilled Nursing Facility Barriers to Discharge: Insurance Authorization  Expected Discharge Plan and Services Expected Discharge Plan: Skilled Nursing Facility   Discharge Planning Services: CM Consult Post Acute Care Choice: IP Rehab Living arrangements for the past 2 months: Apartment                                       Social Determinants of Health (SDOH) Interventions    Readmission Risk Interventions No flowsheet data found.

## 2021-02-04 NOTE — Progress Notes (Addendum)
STROKE TEAM PROGRESS NOTE   SUBJECTIVE (INTERVAL HISTORY) No family at bedside.  Pt doing well, up to chair and feeding himself lunch. He c/o mild h/a only. We are awaiting insurance approval to dc to SNF.  OBJECTIVE Vitals:   02/03/21 2330 02/04/21 0431 02/04/21 0903 02/04/21 1331  BP: 133/90 118/82 (!) 138/94 133/73  Pulse: 76 74 78 88  Resp: 17 18 18 18   Temp: 98.2 F (36.8 C) 98.5 F (36.9 C) 98.2 F (36.8 C) 97.6 F (36.4 C)  TempSrc: Oral Oral Oral Oral  SpO2: 99% 97% 100% 98%  Weight:      Height:        CBC:  Recent Labs  Lab 01/29/21 0017 02/02/21 0057 02/03/21 0308 02/04/21 0408  WBC 9.8   < > 7.1 7.2  NEUTROABS 8.5*  --   --   --   HGB 14.3   < > 14.8 15.2  HCT 43.0   < > 45.2 46.4  MCV 86.7   < > 88.3 87.9  PLT 236   < > 171 181   < > = values in this interval not displayed.     Basic Metabolic Panel:  Recent Labs  Lab 02/03/21 0308 02/04/21 0408  NA 141 142  K 4.1 4.6  CL 108 106  CO2 27 27  GLUCOSE 104* 90  BUN 15 16  CREATININE 1.06 1.13  CALCIUM 9.2 9.4     Lipid Panel:  Recent Labs  Lab 01/29/21 0017  CHOL 172  TRIG 150*  HDL 48  CHOLHDL 3.6  VLDL 30  LDLCALC 94    HgbA1c:  Lab Results  Component Value Date   HGBA1C 5.9 (H) 01/29/2021    IMAGING CT HEAD WO CONTRAST  Result Date: 02/03/2021 CLINICAL DATA:  Stroke follow-up EXAM: CT HEAD WITHOUT CONTRAST TECHNIQUE: Contiguous axial images were obtained from the base of the skull through the vertex without intravenous contrast. COMPARISON:  Two days ago FINDINGS: Brain: Left MCA branch infarct in the lateral and posterior frontal lobe with petechial hemorrhage. No progressive finding. Background of advanced chronic small vessel ischemia with confluent gliosis and chronic lacunar infarct at the right basal ganglia. Remote right occipital cortex infarct. Remote right cerebellar infarct. No hydrocephalus or masslike finding Vascular: Stable Skull: Normal. Negative for fracture or  focal lesion. Sinuses/Orbits: Bilateral cataract resection IMPRESSION: No adverse evolution of the left MCA branch infarct with petechial hemorrhage. Electronically Signed   By: Marnee Spring M.D.   On: 02/03/2021 10:32   CT HEAD WO CONTRAST  Result Date: 02/01/2021 CLINICAL DATA:  Left frontal headache EXAM: CT HEAD WITHOUT CONTRAST TECHNIQUE: Contiguous axial images were obtained from the base of the skull through the vertex without intravenous contrast. COMPARISON:  01/30/2021 FINDINGS: Brain: Evolving left MCA territory infarction is again identified. Gyriform hyperdensity is again noted reflecting petechial hemorrhage. This does not appear substantially changed from the prior study. Additional smaller acute infarcts are better seen on the prior MRI. Chronic infarcts including involvement of right occipital lobe, right medial temporal lobe, right caudate, and right cerebellum are again identified. Stable findings of probable chronic microvascular ischemic changes. No significant mass effect.  No hydrocephalus. Vascular: No new finding. Skull: Calvarium is unremarkable. Sinuses/Orbits: No acute finding. Other: None. IMPRESSION: Evolving recent left MCA territory infarction with similar petechial hemorrhage. No significant mass effect. Electronically Signed   By: Guadlupe Spanish M.D.   On: 02/01/2021 09:32   CT Head Wo Contrast  Result Date:  01/30/2021 CLINICAL DATA:  Stroke follow-up. EXAM: CT HEAD WITHOUT CONTRAST TECHNIQUE: Contiguous axial images were obtained from the base of the skull through the vertex without intravenous contrast. COMPARISON:  Brain MRI 01/29/2021. Noncontrast head CT and CT angiogram head/neck 01/28/2021. FINDINGS: Brain: Mild generalized parenchymal atrophy. Redemonstrated evolving acute cortically based left MCA territory infarct within the left frontoparietal lobes and left insula. Redemonstrated mild corresponding petechial hemorrhage. Small acute infarcts were better  appreciated on the prior MRI. Chronic cortically based right occipital temporal lobe infarct. Chronic lacunar infarcts within the bilateral corona radiata and deep gray nuclei. Stable background moderate/advanced chronic small vessel ischemic disease within the cerebral white matter. Redemonstrated small chronic infarct within the right cerebellar hemisphere. No extra-axial fluid collection. No evidence of an intracranial mass. No midline shift. Vascular: Atherosclerotic calcifications Skull: Normal. Negative for fracture or focal lesion. Sinuses/Orbits: Visualized orbits show no acute finding. Trace mucosal thickening within the right ethmoid air cells. Fluid levels and background mild mucosal thickening within the right greater than left sphenoid sinuses. Trace left maxillary sinus mucosal thickening. IMPRESSION: Evolving moderate to large acute left MCA territory infarct, not appreciably changed in extent from the brain MRI of 01/29/2021. As before, there is mild petechial hemorrhage within the infarction territory. Associated small acute infarcts within the left caudate nucleus were better appreciated on the prior MRI. Otherwise stable non-contrast CT appearance of the brain. 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 Background chronic small vessel ischemic disease with multiple chronic lacunar infarcts. Small chronic infarct within the right cerebellar hemisphere. Mild generalized cerebral atrophy. Paranasal sinus disease, as described. Electronically Signed   By: Jackey Loge DO   On: 01/30/2021 08:30   MR BRAIN WO CONTRAST  Result Date: 01/29/2021 CLINICAL DATA:  Right-sided weakness.  History of stroke EXAM: MRI HEAD WITHOUT CONTRAST TECHNIQUE: Multiplanar, multiecho pulse sequences of the brain and surrounding structures were obtained without intravenous contrast. COMPARISON:  CT and CTA from yesterday FINDINGS: Brain: Large area of cortically based infarction in the lateral left frontal  lobe, MCA branch distribution. Minimal patchy left caudate acute infarcts. Petechial hemorrhage is present. Remote cortically based infarct affecting the right occipital lobe. Chronic lacunar infarcts in the bilateral deep gray nuclei. Chronic small right cerebellar infarct. Confluent chronic small vessel ischemic gliosis in the deep white matter. Brain volume is normal. Chronic micro hemorrhages which are mainly peripheral but in areas that were likely affected by prior infarction. No hydrocephalus, collection, or masslike finding. Vascular: Preserved flow voids. Skull and upper cervical spine: Normal marrow signal Sinuses/Orbits: Nasopharyngeal and sinus opacification in the setting of intubation. Bilateral cataract resection. IMPRESSION: 1. Large acute left MCA branch infarct with petechial hemorrhage. Minimal involvement in the left caudate head. 2. Chronic small vessel disease with chronic lacunar infarcts. Prior right PCA territory infarct. Electronically Signed   By: Marnee Spring M.D.   On: 01/29/2021 05:29   IR CT Head Ltd  Result Date: 01/30/2021 INDICATION: New onset aphasia with right-sided weakness. Occluded superior division of the left middle cerebral artery proximally on CT angiogram of the head and neck. EXAM: 1. EMERGENT LARGE VESSEL OCCLUSION THROMBOLYSIS (anterior CIRCULATION) COMPARISON:  CT angiogram of the head and neck of January 28, 2021. MEDICATIONS: Ancef 2 g IV antibiotic was administered within 1 hour of the procedure. ANESTHESIA/SEDATION: General anesthesia. CONTRAST:  Omnipaque 300 55 mL. FLUOROSCOPY TIME:  Fluoroscopy Time: 20 minutes 18 seconds (1160 mGy). COMPLICATIONS: None immediate. TECHNIQUE: Emergent consent was signed to physicians. No family members  or next of kin were available on phone or physically. Patient was unable to provide consent due to his medical condition. The patient was then put under general anesthesia by the Department of Anesthesiology at [facility]. The right groin was prepped and draped in the usual sterile fashion. Thereafter using modified Seldinger technique, transfemoral access into the right common femoral artery was obtained without difficulty. Over a 0.035 inch guidewire an 8 [place] Pinnacle 25 cm sheath was inserted. Through this, and also over a 0.035 inch guidewire a 5 [place] JB 1 catheter was advanced to the aortic arch region and selectively positioned in the left common carotid artery. FINDINGS: The left common carotid arteriogram demonstrates the left external carotid artery and its major branches to be widely patent. The left internal carotid artery at the bulb has mild narrowing proximally. More distally the vessel is seen to opacify to the cranial skull base. The petrous, cavernous and supraclinoid segments are patent with focal area of mild stenosis in the caval segment. The left anterior cerebral artery opacifies normally into the capillary and venous phases. The left middle cerebral artery M1 segment is widely patent. The inferior division is normal into the capillary and venous phases. The superior division demonstrates a prominent side branch occlusion proximally. PROCEDURE: The diagnostic JB 1 catheter in the left common carotid artery was then exchanged over a 0.035 inch 300 cm Rosen exchange guidewire for an 087 balloon guide catheter which had been prepped with 50% contrast and 50% heparinized saline infusion. Guidewire was removed. Good aspiration obtained from the hub of the balloon guide catheter. Gentle control arteriogram performed through this demonstrated no evidence of spasms, dissections or of intraluminal filling defects. Over a 0.035 Roadrunner guidewire, this was then advanced to the distal 1/3 of the left internal carotid artery. The guidewire was removed. Again good aspiration obtained from the hub of the balloon guide catheter. Control arteriogram performed showed no evidence of spasms, dissections or of  intraluminal filling defects. Over a 0.014 inch standard Synchro micro guidewire with a J-tip configuration the combination of an 055 Zoom aspiration and 136 cm catheter inside of which was a 160 cm 021 Trevo ProVue microcatheter was advanced to the supraclinoid left ICA. The micro guidewire was then gently maneuvered with a torque device into the occluded segment of the left middle cerebral artery superior division into the distal M3 region followed by the microcatheter. The guidewire was removed. Good aspiration obtained from the hub of the microcatheter. Gentle control arteriogram performed through the microcatheter demonstrated safe position of the tip of the microcatheter with antegrade flow of contrast. This was then connected to continuous heparin saline infusion. A 4 mm x 40 mm Solitaire X retrieval device was then advanced to the distal end of the microcatheter. O ring on the delivery microcatheter was loosened. With slight forward gentle traction with the right hand on the delivery micro guidewire with the left hand the delivery microcatheter was retrieved deploying the retrieval device the proximal end of which was just proximal to the occluded inferior division. 055 Zoom aspiration catheter was advanced and engaged at the origin of the occluded branch. With proximal flow arrest in the left internal carotid artery, and with constant aspiration being applied the hub of 055 aspiration catheter with the Penumbra aspiration device, and a 20 mL syringe at the hub of the balloon guide catheter in the left internal carotid artery for approximately 2-1/2 minutes, the combination of the retrieval device, the microcatheter,  and the Zoom aspiration catheter was retrieved and removed. Flow arrest was reversed in the left internal carotid artery. A control arteriogram performed through the balloon guide in the proximal left internal carotid artery demonstrated moderate spasm in the mid cervical left ICA. A 3 mm x 1 mm  clot was noted entangled in the retrieval device. Wide patency was now noted of the previously occluded superior division inferior branch achieving a TICI 3 revascularization of the left middle cerebral artery distribution. The left anterior cerebral artery remained unchanged widely patent. Spasm noted at the distal end of the left internal carotid artery gradually resolved after 4 aliquots of 25 mcg of nitroglycerin intra-arterially. The balloon guide was then retrieved into the left common carotid artery. A final control arteriogram performed through the balloon guide catheter demonstrated near complete resolution of the previous noted spasm in the left internal carotid artery distally. The left MCA and ACA distributions continued to show complete revascularization. The balloon guide was then removed. The 8 French sheath was removed with hemostasis achieved with manual compression for approximately 20 minutes. Distal pulses remained Dopplerable in both feet unchanged. A CT of the brain demonstrated no evidence of intracranial hemorrhages. Patient was left intubated waiting the results of the COVID-19 test. IMPRESSION: Status post endovascular complete revascularization of occluded prominent side branch of the superior division of the left middle cerebral artery with 1 pass with a 4 mm x 40 mm Solitaire X retrieval device and contact aspiration achieving a TICI 3 revascularization. PLAN: Follow-up as per referring MD. Electronically Signed   By: Julieanne Cotton M.D.   On: 01/29/2021 13:05   DG CHEST PORT 1 VIEW  Result Date: 01/29/2021 CLINICAL DATA:  Endotracheal tube placement EXAM: PORTABLE CHEST 1 VIEW COMPARISON:  08/07/2018 FINDINGS: Endotracheal tube placed with tip measuring 3.6 cm above the carina. Enteric tube placed with tip not visualized off the field of view but below the left hemidiaphragm. Cardiac enlargement. Small left pleural effusion. Atelectasis or infiltration in both lung bases. No  pneumothorax. Calcification of the aorta. IMPRESSION: Appliances appear in satisfactory position. Probable small left pleural effusion with atelectasis or infiltration in both lung bases. Electronically Signed   By: Burman Nieves M.D.   On: 01/29/2021 02:49   DG Abd Portable 1V  Result Date: 01/28/2021 CLINICAL DATA:  OG tube placement. EXAM: PORTABLE ABDOMEN - 1 VIEW COMPARISON:  None. FINDINGS: Tip and side port of the enteric tube below the diaphragm in the stomach. Air throughout nondilated small and large bowel in the upper abdomen. IMPRESSION: Tip and side port of the enteric tube below the diaphragm in the stomach. Electronically Signed   By: Narda Rutherford M.D.   On: 01/28/2021 18:02   EEG adult  Result Date: 02/02/2021 Charlsie Quest, MD     02/02/2021  8:41 AM Patient Name: Kenneth Yu MRN: [id number] Epilepsy Attending: Charlsie Quest Referring Physician/Provider: Dr Marvel Plan Date: 02/01/2021 Duration: 29.82mins Patient history: Per RN and speech therapies, patient this morning had acute onset left forehead headache, CT repeat showed a slight increase of hemorrhagic conversion on the left.  However, patient later has no recollection of the headache and CT repeat.  EEG to rule out a seizure episode.  Level of alertness: Awake, asleep AEDs during EEG study: None Technical aspects: This EEG study was done with scalp electrodes positioned according to the 10-20 International system of electrode placement. Electrical activity was acquired at a sampling rate of 500Hz  and reviewed with a  high frequency filter of 70Hz  and a low frequency filter of 1Hz . EEG data were recorded continuously and digitally stored. Description: The posterior dominant rhythm consists of 9 Hz activity of moderate voltage (25-35 uV) seen predominantly in posterior head regions, symmetric and reactive to eye opening and eye closing. Sleep was characterized by vertex waves, sleep spindles (12 to 14 Hz), maximal frontocentral  region. EEG showed continuous left frontotemporal 3 to 6 Hz theta-delta slowing. Hyperventilation and photic stimulation were not performed.   ABNORMALITY - Continuous slow,  left frontotemporal region IMPRESSION: This study is suggestive of cortical dysfunction arising from left frontotemporal region likely secondary to underlying structural abnormality/stroke. No seizures or epileptiform discharges were seen throughout the recording. Charlsie Quest   ECHOCARDIOGRAM COMPLETE  Result Date: 01/29/2021    ECHOCARDIOGRAM REPORT   Patient Name:   Kenneth Yu Date of Exam: 01/29/2021 Medical Rec #:  [id number]     Height:       66.0 in Accession #:    [id number]    Weight:       202.2 lb Date of Birth:  1955-12-05    BSA:          2.009 m Patient Age:    64 years      BP:           143/82 mmHg Patient Gender: M             HR:           98 bpm. Exam Location:  Inpatient Procedure: 2D Echo, Cardiac Doppler, Color Doppler and Intracardiac            Opacification Agent Indications:    Stroke  History:        Patient has no prior history of Echocardiogram examinations.                 Arrythmias:Atrial Fibrillation; Risk Factors:Former Smoker. H/O                 CVA.  Sonographer:    Ross Ludwig RDCS (AE) Referring Phys: [phone] [facility]  Sonographer Comments: Suboptimal apical window. IMPRESSIONS  1. Left ventricular ejection fraction, by estimation, is 50 to 55%. The left ventricle has low normal function. The left ventricle demonstrates regional wall motion abnormalities (lateral wall hypokinesis). There is mild concentric left ventricular hypertrophy. Left ventricular diastolic parameters are consistent with Grade II diastolic dysfunction (pseudonormalization).  2. Right ventricular systolic function is normal. The right ventricular size is mildly enlarged. There is mildly elevated pulmonary artery systolic pressure. The estimated right ventricular systolic pressure is 40.5 mmHg.  3. Left atrial size was  mildly dilated.  4. Right atrial size was severely dilated.  5. The mitral valve is grossly normal. Mild mitral valve regurgitation. The mean mitral valve gradient is 3.0 mmHg with average heart rate of 89 bpm.  6. The aortic valve is tricuspid. There is mild calcification of the aortic valve. There is mild thickening of the aortic valve. Aortic valve regurgitation is not visualized. Mild aortic valve sclerosis is present, with no evidence of aortic valve stenosis.  7. The inferior vena cava is normal in size with <50% respiratory variability, suggesting right atrial pressure of 8 mmHg. FINDINGS  Left Ventricle: Left ventricular ejection fraction, by estimation, is 50 to 55%. The left ventricle has low normal function. The left ventricle demonstrates regional wall motion abnormalities. Definity contrast agent was given IV to delineate the left ventricular endocardial borders. The  left ventricular internal cavity size was normal in size. There is mild concentric left ventricular hypertrophy. Left ventricular diastolic parameters are consistent with Grade II diastolic dysfunction (pseudonormalization).  LV Wall Scoring: The mid and distal lateral wall, posterior wall, mid anterolateral segment, and apical anterior segment are hypokinetic. The basal anterolateral segment is normal. Right Ventricle: The right ventricular size is mildly enlarged. No increase in right ventricular wall thickness. Right ventricular systolic function is normal. There is mildly elevated pulmonary artery systolic pressure. The tricuspid regurgitant velocity is 2.85 m/s, and with an assumed right atrial pressure of 8 mmHg, the estimated right ventricular systolic pressure is 40.5 mmHg. Left Atrium: Left atrial size was mildly dilated. Right Atrium: Right atrial size was severely dilated. Pericardium: There is no evidence of pericardial effusion. Mitral Valve: The mitral valve is grossly normal. Mild mitral valve regurgitation. MV peak gradient,  6.8 mmHg. The mean mitral valve gradient is 3.0 mmHg with average heart rate of 89 bpm. Tricuspid Valve: The tricuspid valve is normal in structure. Tricuspid valve regurgitation is mild . No evidence of tricuspid stenosis. Aortic Valve: The aortic valve is tricuspid. There is mild calcification of the aortic valve. There is mild thickening of the aortic valve. Aortic valve regurgitation is not visualized. Mild aortic valve sclerosis is present, with no evidence of aortic valve stenosis. Aortic valve mean gradient measures 5.0 mmHg. Aortic valve peak gradient measures 10.1 mmHg. Aortic valve area, by VTI measures 1.59 cm. Pulmonic Valve: The pulmonic valve was normal in structure. Pulmonic valve regurgitation is not visualized. No evidence of pulmonic stenosis. Aorta: The aortic root and ascending aorta are structurally normal, with no evidence of dilitation. Venous: The inferior vena cava is normal in size with less than 50% respiratory variability, suggesting right atrial pressure of 8 mmHg. IAS/Shunts: The atrial septum is grossly normal.  LEFT VENTRICLE PLAX 2D LVIDd:         5.10 cm  Diastology LVIDs:         3.60 cm  LV e' medial:    7.40 cm/s LV PW:         1.60 cm  LV E/e' medial:  15.1 LV IVS:        1.80 cm  LV e' lateral:   7.78 cm/s LVOT diam:     1.90 cm  LV E/e' lateral: 14.4 LV SV:         44 LV SV Index:   22 LVOT Area:     2.84 cm  RIGHT VENTRICLE             IVC RV Basal diam:  4.30 cm     IVC diam: 2.30 cm RV S prime:     12.30 cm/s TAPSE (M-mode): 1.4 cm LEFT ATRIUM             Index       RIGHT ATRIUM           Index LA diam:        3.60 cm 1.79 cm/m  RA Area:     36.50 cm LA Vol (A2C):   73.0 ml 36.34 ml/m RA Volume:   156.00 ml 77.65 ml/m LA Vol (A4C):   73.0 ml 36.34 ml/m LA Biplane Vol: 74.3 ml 36.98 ml/m  AORTIC VALVE AV Area (Vmax):    1.66 cm AV Area (Vmean):   1.40 cm AV Area (VTI):     1.59 cm AV Vmax:  159.00 cm/s AV Vmean:          108.000 cm/s AV VTI:             0.275 m AV Peak Grad:      10.1 mmHg AV Mean Grad:      5.0 mmHg LVOT Vmax:         93.20 cm/s LVOT Vmean:        53.200 cm/s LVOT VTI:          0.154 m LVOT/AV VTI ratio: 0.56  AORTA Ao Root diam: 3.10 cm Ao Asc diam:  3.60 cm MITRAL VALVE                TRICUSPID VALVE MV Area (PHT): 4.41 cm     TR Peak grad:   32.5 mmHg MV Area VTI:   1.85 cm     TR Vmax:        285.00 cm/s MV Peak grad:  6.8 mmHg MV Mean grad:  3.0 mmHg     SHUNTS MV Vmax:       1.30 m/s     Systemic VTI:  0.15 m MV Vmean:      75.2 cm/s    Systemic Diam: 1.90 cm MV Decel Time: 172 msec MV E velocity: 112.00 cm/s MV A velocity: 121.00 cm/s MV E/A ratio:  0.93 Riley Lam MD Electronically signed by Riley Lam MD Signature Date/Time: 01/29/2021/12:28:37 PM    Final    IR PERCUTANEOUS ART THROMBECTOMY/INFUSION INTRACRANIAL INC DIAG ANGIO  Result Date: 01/30/2021 INDICATION: New onset aphasia with right-sided weakness. Occluded superior division of the left middle cerebral artery proximally on CT angiogram of the head and neck. EXAM: 1. EMERGENT LARGE VESSEL OCCLUSION THROMBOLYSIS (anterior CIRCULATION) COMPARISON:  CT angiogram of the head and neck of January 28, 2021. MEDICATIONS: Ancef 2 g IV antibiotic was administered within 1 hour of the procedure. ANESTHESIA/SEDATION: General anesthesia. CONTRAST:  Omnipaque 300 55 mL. FLUOROSCOPY TIME:  Fluoroscopy Time: 20 minutes 18 seconds (1160 mGy). COMPLICATIONS: None immediate. TECHNIQUE: Emergent consent was signed to physicians. No family members or next of kin were available on phone or physically. Patient was unable to provide consent due to his medical condition. The patient was then put under general anesthesia by the Department of Anesthesiology at [facility]. The right groin was prepped and draped in the usual sterile fashion. Thereafter using modified Seldinger technique, transfemoral access into the right common femoral artery was obtained without difficulty. Over  a 0.035 inch guidewire an 8 [place] Pinnacle 25 cm sheath was inserted. Through this, and also over a 0.035 inch guidewire a 5 [place] JB 1 catheter was advanced to the aortic arch region and selectively positioned in the left common carotid artery. FINDINGS: The left common carotid arteriogram demonstrates the left external carotid artery and its major branches to be widely patent. The left internal carotid artery at the bulb has mild narrowing proximally. More distally the vessel is seen to opacify to the cranial skull base. The petrous, cavernous and supraclinoid segments are patent with focal area of mild stenosis in the caval segment. The left anterior cerebral artery opacifies normally into the capillary and venous phases. The left middle cerebral artery M1 segment is widely patent. The inferior division is normal into the capillary and venous phases. The superior division demonstrates a prominent side branch occlusion proximally. PROCEDURE: The diagnostic JB 1 catheter in the left common carotid artery was then exchanged over a 0.035 inch 300 cm  Rosen exchange guidewire for an 087 balloon guide catheter which had been prepped with 50% contrast and 50% heparinized saline infusion. Guidewire was removed. Good aspiration obtained from the hub of the balloon guide catheter. Gentle control arteriogram performed through this demonstrated no evidence of spasms, dissections or of intraluminal filling defects. Over a 0.035 Roadrunner guidewire, this was then advanced to the distal 1/3 of the left internal carotid artery. The guidewire was removed. Again good aspiration obtained from the hub of the balloon guide catheter. Control arteriogram performed showed no evidence of spasms, dissections or of intraluminal filling defects. Over a 0.014 inch standard Synchro micro guidewire with a J-tip configuration the combination of an 055 Zoom aspiration and 136 cm catheter inside of which was a 160 cm 021 Trevo ProVue  microcatheter was advanced to the supraclinoid left ICA. The micro guidewire was then gently maneuvered with a torque device into the occluded segment of the left middle cerebral artery superior division into the distal M3 region followed by the microcatheter. The guidewire was removed. Good aspiration obtained from the hub of the microcatheter. Gentle control arteriogram performed through the microcatheter demonstrated safe position of the tip of the microcatheter with antegrade flow of contrast. This was then connected to continuous heparin saline infusion. A 4 mm x 40 mm Solitaire X retrieval device was then advanced to the distal end of the microcatheter. O ring on the delivery microcatheter was loosened. With slight forward gentle traction with the right hand on the delivery micro guidewire with the left hand the delivery microcatheter was retrieved deploying the retrieval device the proximal end of which was just proximal to the occluded inferior division. 055 Zoom aspiration catheter was advanced and engaged at the origin of the occluded branch. With proximal flow arrest in the left internal carotid artery, and with constant aspiration being applied the hub of 055 aspiration catheter with the Penumbra aspiration device, and a 20 mL syringe at the hub of the balloon guide catheter in the left internal carotid artery for approximately 2-1/2 minutes, the combination of the retrieval device, the microcatheter, and the Zoom aspiration catheter was retrieved and removed. Flow arrest was reversed in the left internal carotid artery. A control arteriogram performed through the balloon guide in the proximal left internal carotid artery demonstrated moderate spasm in the mid cervical left ICA. A 3 mm x 1 mm clot was noted entangled in the retrieval device. Wide patency was now noted of the previously occluded superior division inferior branch achieving a TICI 3 revascularization of the left middle cerebral artery  distribution. The left anterior cerebral artery remained unchanged widely patent. Spasm noted at the distal end of the left internal carotid artery gradually resolved after 4 aliquots of 25 mcg of nitroglycerin intra-arterially. The balloon guide was then retrieved into the left common carotid artery. A final control arteriogram performed through the balloon guide catheter demonstrated near complete resolution of the previous noted spasm in the left internal carotid artery distally. The left MCA and ACA distributions continued to show complete revascularization. The balloon guide was then removed. The 8 French sheath was removed with hemostasis achieved with manual compression for approximately 20 minutes. Distal pulses remained Dopplerable in both feet unchanged. A CT of the brain demonstrated no evidence of intracranial hemorrhages. Patient was left intubated waiting the results of the COVID-19 test. IMPRESSION: Status post endovascular complete revascularization of occluded prominent side branch of the superior division of the left middle cerebral artery with 1 pass with  a 4 mm x 40 mm Solitaire X retrieval device and contact aspiration achieving a TICI 3 revascularization. PLAN: Follow-up as per referring MD. Electronically Signed   By: Julieanne Cotton M.D.   On: 01/29/2021 13:05   CT HEAD CODE STROKE WO CONTRAST  Result Date: 01/28/2021 CLINICAL DATA:  Right-sided weakness EXAM: CT ANGIOGRAPHY HEAD AND NECK CT PERFUSION BRAIN TECHNIQUE: Multidetector CT imaging of the head and neck was performed using the standard protocol during bolus administration of intravenous contrast. Multiplanar CT image reconstructions and MIPs were obtained to evaluate the vascular anatomy. Carotid stenosis measurements (when applicable) are obtained utilizing NASCET criteria, using the distal internal carotid diameter as the denominator. Multiphase CT imaging of the brain was performed following IV bolus contrast injection.  Subsequent parametric perfusion maps were calculated using RAPID software. CONTRAST:  80 mL Omnipaque 350 COMPARISON:  Report of CTA from 2015 only. Images are unavailable at the time of dictation. FINDINGS: CT HEAD Brain: There is no acute intracranial hemorrhage or mass effect. There is hypoattenuation with loss of gray-white differentiation involving left insula and left frontoparietal lobes (pre and postcentral gyri). There is likely a chronic small left frontal cortical infarct involving the precentral gyrus. Chronic right occipitotemporal infarct. There are age-indeterminate but probably chronic small vessel infarcts of basal ganglia and central white matter bilaterally. Additional patchy and confluent areas of low-attenuation in the supratentorial white matter nonspecific but may reflect moderate chronic microvascular ischemic changes. Vascular: No definite hyperdense vessel. There may be focal hyperdensity of distal left M2 branch within the sylvian fissure, which would correspond to CTA findings. Skull: Calvarium is unremarkable. Sinuses/Orbits: No acute finding. Other: None. Review of the MIP images confirms the above findings ASPECTS [facility] Stroke Program Early CT Score) - Ganglionic level infarction (caudate, lentiform nuclei, internal capsule, insula, M1-M3 cortex): 5 - Supraganglionic infarction (M4-M6 cortex): 2 Total score (0-10 with 10 being normal): 7 CTA NECK Aortic arch: Great vessel origins are patent with mild calcified plaque. Right carotid system: Patent. Calcified plaque at the bifurcation and along the proximal internal carotid with less than 50% stenosis. Retropharyngeal course of the ICA. Left carotid system: Patent. Mixed plaque at the bifurcation and proximal internal carotid without stenosis. Retropharyngeal course of the ICA. Vertebral arteries: Patent and codominant.  No stenosis. Skeleton: Mild degenerative changes of the cervical spine. Other neck: Diffusely enlarged but  homogeneous appearing thyroid. No ultrasound follow-up is recommended by current guidelines. Upper chest: Mild centrilobular emphysema. Review of the MIP images confirms the above findings CTA HEAD Anterior circulation: Intracranial internal carotid arteries are patent with calcified plaque along the cavernous and proximal supraclinoid portions. There is moderate to marked stenosis of the proximal left cavernous segment. Additional areas of moderate stenosis bilaterally. Left M1 MCA is patent. There is distal left M2 branch occlusion within the posterior sylvian fissure. Right middle cerebral artery is patent. Anterior cerebral arteries are patent. Anterior communicating artery is present. Posterior circulation: Intracranial vertebral arteries are patent. Basilar artery is patent. Major cerebellar artery origins are patent. Left posterior cerebral artery is patent. There is significantly decreased flow within the right PCA beginning at the P2 segment probably related to prior infarct. Venous sinuses: Patent as allowed by contrast bolus timing. Review of the MIP images confirms the above findings CT Brain Perfusion Findings: CBF (<30%) Volume: 11mL Perfusion (Tmax>6.0s) volume: 39mL Mismatch Volume: 28mL Infarction Location: Left MCA territory IMPRESSION: No acute intracranial hemorrhage. Acute left MCA territory infarction with ASPECT score of 7. Distal  left M2 MCA branch occlusion. Perfusion imaging demonstrates core infarction of 11 mL congruent with noncontrast CT findings. There is a calculated penumbra of 28 mL. Additional chronic findings detailed above including infarcts and microvascular ischemic changes. Plaque without hemodynamically significant stenosis at the ICA origins. Calcified plaque along the intracranial internal carotid arteries. Suspect moderate to marked stenosis of the proximal left cavernous segment. Additional areas of moderate stenosis bilaterally. Significantly decreased flow within the  right PCA beginning at the P2 segment probably related to prior infarct. Emphysema. Mild enlargement of the main pulmonary artery, which could indicate pulmonary arterial hypertension. Initial results were communicated to Dr. Amada Jupiter at 12:27 pm on 01/28/2021 by text page via the [facility] messaging system. Electronically Signed   By: Guadlupe Spanish M.D.   On: 01/28/2021 12:44   CT ANGIO HEAD NECK W WO CM W PERF (CODE STROKE)  Result Date: 01/28/2021 CLINICAL DATA:  Right-sided weakness EXAM: CT ANGIOGRAPHY HEAD AND NECK CT PERFUSION BRAIN TECHNIQUE: Multidetector CT imaging of the head and neck was performed using the standard protocol during bolus administration of intravenous contrast. Multiplanar CT image reconstructions and MIPs were obtained to evaluate the vascular anatomy. Carotid stenosis measurements (when applicable) are obtained utilizing NASCET criteria, using the distal internal carotid diameter as the denominator. Multiphase CT imaging of the brain was performed following IV bolus contrast injection. Subsequent parametric perfusion maps were calculated using RAPID software. CONTRAST:  80 mL Omnipaque 350 COMPARISON:  Report of CTA from 2015 only. Images are unavailable at the time of dictation. FINDINGS: CT HEAD Brain: There is no acute intracranial hemorrhage or mass effect. There is hypoattenuation with loss of gray-white differentiation involving left insula and left frontoparietal lobes (pre and postcentral gyri). There is likely a chronic small left frontal cortical infarct involving the precentral gyrus. Chronic right occipitotemporal infarct. There are age-indeterminate but probably chronic small vessel infarcts of basal ganglia and central white matter bilaterally. Additional patchy and confluent areas of low-attenuation in the supratentorial white matter nonspecific but may reflect moderate chronic microvascular ischemic changes. Vascular: No definite hyperdense vessel. There may be focal  hyperdensity of distal left M2 branch within the sylvian fissure, which would correspond to CTA findings. Skull: Calvarium is unremarkable. Sinuses/Orbits: No acute finding. Other: None. Review of the MIP images confirms the above findings ASPECTS [facility] Stroke Program Early CT Score) - Ganglionic level infarction (caudate, lentiform nuclei, internal capsule, insula, M1-M3 cortex): 5 - Supraganglionic infarction (M4-M6 cortex): 2 Total score (0-10 with 10 being normal): 7 CTA NECK Aortic arch: Great vessel origins are patent with mild calcified plaque. Right carotid system: Patent. Calcified plaque at the bifurcation and along the proximal internal carotid with less than 50% stenosis. Retropharyngeal course of the ICA. Left carotid system: Patent. Mixed plaque at the bifurcation and proximal internal carotid without stenosis. Retropharyngeal course of the ICA. Vertebral arteries: Patent and codominant.  No stenosis. Skeleton: Mild degenerative changes of the cervical spine. Other neck: Diffusely enlarged but homogeneous appearing thyroid. No ultrasound follow-up is recommended by current guidelines. Upper chest: Mild centrilobular emphysema. Review of the MIP images confirms the above findings CTA HEAD Anterior circulation: Intracranial internal carotid arteries are patent with calcified plaque along the cavernous and proximal supraclinoid portions. There is moderate to marked stenosis of the proximal left cavernous segment. Additional areas of moderate stenosis bilaterally. Left M1 MCA is patent. There is distal left M2 branch occlusion within the posterior sylvian fissure. Right middle cerebral artery is patent. Anterior cerebral arteries  are patent. Anterior communicating artery is present. Posterior circulation: Intracranial vertebral arteries are patent. Basilar artery is patent. Major cerebellar artery origins are patent. Left posterior cerebral artery is patent. There is significantly decreased flow within  the right PCA beginning at the P2 segment probably related to prior infarct. Venous sinuses: Patent as allowed by contrast bolus timing. Review of the MIP images confirms the above findings CT Brain Perfusion Findings: CBF (<30%) Volume: 11mL Perfusion (Tmax>6.0s) volume: 39mL Mismatch Volume: 28mL Infarction Location: Left MCA territory IMPRESSION: No acute intracranial hemorrhage. Acute left MCA territory infarction with ASPECT score of 7. Distal left M2 MCA branch occlusion. Perfusion imaging demonstrates core infarction of 11 mL congruent with noncontrast CT findings. There is a calculated penumbra of 28 mL. Additional chronic findings detailed above including infarcts and microvascular ischemic changes. Plaque without hemodynamically significant stenosis at the ICA origins. Calcified plaque along the intracranial internal carotid arteries. Suspect moderate to marked stenosis of the proximal left cavernous segment. Additional areas of moderate stenosis bilaterally. Significantly decreased flow within the right PCA beginning at the P2 segment probably related to prior infarct. Emphysema. Mild enlargement of the main pulmonary artery, which could indicate pulmonary arterial hypertension. Initial results were communicated to Dr. Amada Jupiter at 12:27 pm on 01/28/2021 by text page via the [facility] messaging system. Electronically Signed   By: Guadlupe Spanish M.D.   On: 01/28/2021 12:44     PHYSICAL EXAM  General exam Pleasant middle-aged African-American male, no acute distress HEENT-  Normocephalic, no lesions, without obvious abnormality.  Normal external eye and conjunctiva.   Cardiovascular- regular rhythm, on tele, tachycardic    Neurologic Exam: Alert, orients to name, dob and age today. Still with expressive aphasia, able to repeat short sentences with severe dysarthric and difficult to understand.  Follows simple commands, comprehension seems intact.  No gaze palsy, tracking bilaterally, visual field  full, right facial droop, tongue protrusion to the right.  Right upper extremity is 2/5, right lower extremity 3/5.  Left upper and lower extremity 5/5.  Sensation decreased on the right.  Left finger-to-nose intact.  Gait not tested.   ASSESSMENT/PLAN [name] is a 65 y.o. male with a past medical history significant for previous stroke, DVT on Xarelto, HTN, CAD/MI, HLD who presented to [facility] after being found by his ex-wife with right-sided weakness and aphasia. On arrival, imaging showed a distal left M2 MCA branch occlusion with a large area of penumbra and the CT perfusion is favorable for intervention and was taken for emergent thrombectomy. More recent MR this morning showed a large acute left MCA branch infarct with petechial hemorrhage.  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 Fib LDL 94  HgbA1c 5.9 Was on aspirin 81, resume aspirin 81 for stroke prevention.  No DAPT at this time given hemorrhagic transformation. 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 Pearlean BrownieSethi 01/30/21 note)  Hypertension Stable Continue lisinopril, amlodipine BP goal less than 160 given hemorrhagic conversion Long-term BP goal normotensive  Hyperlipidemia LDL 94, goal < 70 Add Lipitor 40mg  daily Continue statin at discharge  Dysphagia  Passed swallow on dys 1 and thin Speech on board Avoid aspiration  Hospital day # 7   ATTENDING NOTE: I reviewed above note and agree with the assessment and plan. Pt was seen and examined.   No acute event overnight, patient neuro stable, tolerating aspirin 81 without neuro changes.  PT/OT recommend SNF, still pending SNF placement.  Loop recorder before discharge.  For detailed assessment and plan, please refer to above as I have made changes wherever appropriate.   Marvel PlanJindong Tanequa Kretz, MD PhD Stroke Neurology 02/04/2021 7:48 PM   To contact Stroke Continuity provider, please refer to [email or web address]. After hours, contact General Neurology

## 2021-02-04 NOTE — Care Management Important Message (Signed)
Important Message  Patient Details  Name: Kenneth Yu MRN: [id number] Date of Birth: [birth year]   Medicare Important Message Given:  Yes     Renie Ora 02/04/2021, 10:33 AM

## 2021-02-04 NOTE — Plan of Care (Signed)
  Problem: Education: Goal: Knowledge of General Education information will improve Description: Including pain rating scale, medication(s)/side effects and non-pharmacologic comfort measures Outcome: Progressing   Problem: Clinical Measurements: Goal: Ability to maintain clinical measurements within normal limits will improve Outcome: Progressing Goal: Will remain free from infection Outcome: Progressing Goal: Diagnostic test results will improve Outcome: Progressing Goal: Respiratory complications will improve Outcome: Progressing Goal: Cardiovascular complication will be avoided Outcome: Progressing   Problem: Coping: Goal: Level of anxiety will decrease Outcome: Progressing   Problem: Safety: Goal: Ability to remain free from injury will improve Outcome: Progressing   Problem: Ischemic Stroke/TIA Tissue Perfusion: Goal: Complications of ischemic stroke/TIA will be minimized Outcome: Progressing

## 2021-02-05 DIAGNOSIS — I6602 Occlusion and stenosis of left middle cerebral artery: Secondary | ICD-10-CM | POA: Diagnosis not present

## 2021-02-05 DIAGNOSIS — I63412 Cerebral infarction due to embolism of left middle cerebral artery: Secondary | ICD-10-CM | POA: Diagnosis not present

## 2021-02-05 LAB — CBC
HCT: 45.6 % (ref 39.0–52.0)
Hemoglobin: 15.2 g/dL (ref 13.0–17.0)
MCH: 29.1 pg (ref 26.0–34.0)
MCHC: 33.3 g/dL (ref 30.0–36.0)
MCV: 87.2 fL (ref 80.0–100.0)
Platelets: 191 10*3/uL (ref 150–400)
RBC: 5.23 MIL/uL (ref 4.22–5.81)
RDW: 13.8 % (ref 11.5–15.5)
WBC: 7.4 10*3/uL (ref 4.0–10.5)
nRBC: 0 % (ref 0.0–0.2)

## 2021-02-05 LAB — BASIC METABOLIC PANEL
Anion gap: 9 (ref 5–15)
BUN: 18 mg/dL (ref 8–23)
CO2: 27 mmol/L (ref 22–32)
Calcium: 9.3 mg/dL (ref 8.9–10.3)
Chloride: 106 mmol/L (ref 98–111)
Creatinine, Ser: 1.2 mg/dL (ref 0.61–1.24)
GFR, Estimated: >60 mL/min (ref >60)
Glucose, Bld: 98 mg/dL (ref 70–99)
Potassium: 4.2 mmol/L (ref 3.5–5.1)
Sodium: 142 mmol/L (ref 135–145)

## 2021-02-05 MED ORDER — FLEET ENEMA 7-19 GM/118ML RE ENEM
1.0000 | ENEMA | Freq: Once | RECTAL | Status: AC
  Administered 2021-02-05: 1 via RECTAL
  Filled 2021-02-05: qty 1

## 2021-02-05 NOTE — Plan of Care (Signed)
  Problem: Education: Goal: Knowledge of General Education information will improve Description: Including pain rating scale, medication(s)/side effects and non-pharmacologic comfort measures Outcome: Progressing   Problem: Health Behavior/Discharge Planning: Goal: Ability to manage health-related needs will improve Outcome: Progressing   Problem: Clinical Measurements: Goal: Ability to maintain clinical measurements within normal limits will improve Outcome: Progressing Goal: Will remain free from infection Outcome: Progressing Goal: Diagnostic test results will improve Outcome: Progressing Goal: Respiratory complications will improve Outcome: Progressing Goal: Cardiovascular complication will be avoided Outcome: Progressing   Problem: Ischemic Stroke/TIA Tissue Perfusion: Goal: Complications of ischemic stroke/TIA will be minimized Outcome: Progressing   

## 2021-02-05 NOTE — Care Management Important Message (Signed)
Important Message  Patient Details  Name: Kenneth Yu MRN: [id number] Date of Birth: 1956/01/24   Medicare Important Message Given:  Yes     Dorena Bodo 02/05/2021, 2:54 PM

## 2021-02-05 NOTE — Plan of Care (Signed)
  Problem: Education: Goal: Knowledge of disease or condition will improve Outcome: Progressing Goal: Knowledge of secondary prevention will improve Outcome: Progressing Goal: Knowledge of patient specific risk factors addressed and post discharge goals established will improve Outcome: Progressing Goal: Individualized Educational Video(s) Outcome: Progressing   Problem: Coping: Goal: Will verbalize positive feelings about self Outcome: Progressing Goal: Will identify appropriate support needs Outcome: Progressing   Problem: Health Behavior/Discharge Planning: Goal: Ability to manage health-related needs will improve Outcome: Progressing   Problem: Self-Care: Goal: Ability to participate in self-care as condition permits will improve Outcome: Progressing Goal: Verbalization of feelings and concerns over difficulty with self-care will improve Outcome: Progressing   

## 2021-02-05 NOTE — Progress Notes (Addendum)
STROKE TEAM PROGRESS NOTE   SUBJECTIVE (INTERVAL HISTORY) No family at bedside.  Pt doing well and is active in his care. We are awaiting insurance approval to dc to SNF.  OBJECTIVE Vitals:   02/04/21 2338 02/05/21 0346 02/05/21 0712 02/05/21 1214  BP: 117/72 125/86 126/90 112/69  Pulse: 76 82 72 88  Resp: 17 16 19 16   Temp: 98.2 F (36.8 C) 99 F (37.2 C) 98.4 F (36.9 C) 98.3 F (36.8 C)  TempSrc:   Oral Oral  SpO2: 95% 95% 97% 97%  Weight:      Height:        CBC:  Recent Labs  Lab 02/04/21 0408 02/05/21 0334  WBC 7.2 7.4  HGB 15.2 15.2  HCT 46.4 45.6  MCV 87.9 87.2  PLT 181 191     Basic Metabolic Panel:  Recent Labs  Lab 02/04/21 0408 02/05/21 0334  NA 142 142  K 4.6 4.2  CL 106 106  CO2 27 27  GLUCOSE 90 98  BUN 16 18  CREATININE 1.13 1.20  CALCIUM 9.4 9.3     Lipid Panel:  No results for input(s): CHOL, TRIG, HDL, CHOLHDL, VLDL, LDLCALC in the last 168 hours.  HgbA1c:  Lab Results  Component Value Date   HGBA1C 5.9 (H) 01/29/2021    IMAGING CT HEAD WO CONTRAST  Result Date: 02/03/2021 CLINICAL DATA:  Stroke follow-up EXAM: CT HEAD WITHOUT CONTRAST TECHNIQUE: Contiguous axial images were obtained from the base of the skull through the vertex without intravenous contrast. COMPARISON:  Two days ago FINDINGS: Brain: Left MCA branch infarct in the lateral and posterior frontal lobe with petechial hemorrhage. No progressive finding. Background of advanced chronic small vessel ischemia with confluent gliosis and chronic lacunar infarct at the right basal ganglia. Remote right occipital cortex infarct. Remote right cerebellar infarct. No hydrocephalus or masslike finding Vascular: Stable Skull: Normal. Negative for fracture or focal lesion. Sinuses/Orbits: Bilateral cataract resection IMPRESSION: No adverse evolution of the left MCA branch infarct with petechial hemorrhage. Electronically Signed   By: 04/06/2021 M.D.   On: 02/03/2021 10:32   CT  HEAD WO CONTRAST  Result Date: 02/01/2021 CLINICAL DATA:  Left frontal headache EXAM: CT HEAD WITHOUT CONTRAST TECHNIQUE: Contiguous axial images were obtained from the base of the skull through the vertex without intravenous contrast. COMPARISON:  01/30/2021 FINDINGS: Brain: Evolving left MCA territory infarction is again identified. Gyriform hyperdensity is again noted reflecting petechial hemorrhage. This does not appear substantially changed from the prior study. Additional smaller acute infarcts are better seen on the prior MRI. Chronic infarcts including involvement of right occipital lobe, right medial temporal lobe, right caudate, and right cerebellum are again identified. Stable findings of probable chronic microvascular ischemic changes. No significant mass effect.  No hydrocephalus. Vascular: No new finding. Skull: Calvarium is unremarkable. Sinuses/Orbits: No acute finding. Other: None. IMPRESSION: Evolving recent left MCA territory infarction with similar petechial hemorrhage. No significant mass effect. Electronically Signed   By: 04/02/2021 M.D.   On: 02/01/2021 09:32   CT Head Wo Contrast  Result Date: 01/30/2021 CLINICAL DATA:  Stroke follow-up. EXAM: CT HEAD WITHOUT CONTRAST TECHNIQUE: Contiguous axial images were obtained from the base of the skull through the vertex without intravenous contrast. COMPARISON:  Brain MRI 01/29/2021. Noncontrast head CT and CT angiogram head/neck 01/28/2021. FINDINGS: Brain: Mild generalized parenchymal atrophy. Redemonstrated evolving acute cortically based left MCA territory infarct within the left frontoparietal lobes and left insula. Redemonstrated mild corresponding petechial hemorrhage.  Small acute infarcts were better appreciated on the prior MRI. Chronic cortically based right occipital temporal lobe infarct. Chronic lacunar infarcts within the bilateral corona radiata and deep gray nuclei. Stable background moderate/advanced chronic small vessel  ischemic disease within the cerebral white matter. Redemonstrated small chronic infarct within the right cerebellar hemisphere. No extra-axial fluid collection. No evidence of an intracranial mass. No midline shift. Vascular: Atherosclerotic calcifications Skull: Normal. Negative for fracture or focal lesion. Sinuses/Orbits: Visualized orbits show no acute finding. Trace mucosal thickening within the right ethmoid air cells. Fluid levels and background mild mucosal thickening within the right greater than left sphenoid sinuses. Trace left maxillary sinus mucosal thickening. IMPRESSION: Evolving moderate to large acute left MCA territory infarct, not appreciably changed in extent from the brain MRI of 01/29/2021. As before, there is mild petechial hemorrhage within the infarction territory. Associated small acute infarcts within the left caudate nucleus were better appreciated on the prior MRI. Otherwise stable non-contrast CT appearance of the brain. 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 Background chronic small vessel ischemic disease with multiple chronic lacunar infarcts. Small chronic infarct within the right cerebellar hemisphere. Mild generalized cerebral atrophy. Paranasal sinus disease, as described. Electronically Signed   By: Jackey Loge DO   On: 01/30/2021 08:30   MR BRAIN WO CONTRAST  Result Date: 01/29/2021 CLINICAL DATA:  Right-sided weakness.  History of stroke EXAM: MRI HEAD WITHOUT CONTRAST TECHNIQUE: Multiplanar, multiecho pulse sequences of the brain and surrounding structures were obtained without intravenous contrast. COMPARISON:  CT and CTA from yesterday FINDINGS: Brain: Large area of cortically based infarction in the lateral left frontal lobe, MCA branch distribution. Minimal patchy left caudate acute infarcts. Petechial hemorrhage is present. Remote cortically based infarct affecting the right occipital lobe. Chronic lacunar infarcts in the bilateral deep gray nuclei.  Chronic small right cerebellar infarct. Confluent chronic small vessel ischemic gliosis in the deep white matter. Brain volume is normal. Chronic micro hemorrhages which are mainly peripheral but in areas that were likely affected by prior infarction. No hydrocephalus, collection, or masslike finding. Vascular: Preserved flow voids. Skull and upper cervical spine: Normal marrow signal Sinuses/Orbits: Nasopharyngeal and sinus opacification in the setting of intubation. Bilateral cataract resection. IMPRESSION: 1. Large acute left MCA branch infarct with petechial hemorrhage. Minimal involvement in the left caudate head. 2. Chronic small vessel disease with chronic lacunar infarcts. Prior right PCA territory infarct. Electronically Signed   By: Marnee Spring M.D.   On: 01/29/2021 05:29   IR CT Head Ltd  Result Date: 01/30/2021 INDICATION: New onset aphasia with right-sided weakness. Occluded superior division of the left middle cerebral artery proximally on CT angiogram of the head and neck. EXAM: 1. EMERGENT LARGE VESSEL OCCLUSION THROMBOLYSIS (anterior CIRCULATION) COMPARISON:  CT angiogram of the head and neck of January 28, 2021. MEDICATIONS: Ancef 2 g IV antibiotic was administered within 1 hour of the procedure. ANESTHESIA/SEDATION: General anesthesia. CONTRAST:  Omnipaque 300 55 mL. FLUOROSCOPY TIME:  Fluoroscopy Time: 20 minutes 18 seconds (1160 mGy). COMPLICATIONS: None immediate. TECHNIQUE: Emergent consent was signed to physicians. No family members or next of kin were available on phone or physically. Patient was unable to provide consent due to his medical condition. The patient was then put under general anesthesia by the Department of Anesthesiology at [facility]. The right groin was prepped and draped in the usual sterile fashion. Thereafter using modified Seldinger technique, transfemoral access into the right common femoral artery was obtained without difficulty. Over a 0.035  inch guidewire  an 8 [place] Pinnacle 25 cm sheath was inserted. Through this, and also over a 0.035 inch guidewire a 5 [place] JB 1 catheter was advanced to the aortic arch region and selectively positioned in the left common carotid artery. FINDINGS: The left common carotid arteriogram demonstrates the left external carotid artery and its major branches to be widely patent. The left internal carotid artery at the bulb has mild narrowing proximally. More distally the vessel is seen to opacify to the cranial skull base. The petrous, cavernous and supraclinoid segments are patent with focal area of mild stenosis in the caval segment. The left anterior cerebral artery opacifies normally into the capillary and venous phases. The left middle cerebral artery M1 segment is widely patent. The inferior division is normal into the capillary and venous phases. The superior division demonstrates a prominent side branch occlusion proximally. PROCEDURE: The diagnostic JB 1 catheter in the left common carotid artery was then exchanged over a 0.035 inch 300 cm Rosen exchange guidewire for an 087 balloon guide catheter which had been prepped with 50% contrast and 50% heparinized saline infusion. Guidewire was removed. Good aspiration obtained from the hub of the balloon guide catheter. Gentle control arteriogram performed through this demonstrated no evidence of spasms, dissections or of intraluminal filling defects. Over a 0.035 Roadrunner guidewire, this was then advanced to the distal 1/3 of the left internal carotid artery. The guidewire was removed. Again good aspiration obtained from the hub of the balloon guide catheter. Control arteriogram performed showed no evidence of spasms, dissections or of intraluminal filling defects. Over a 0.014 inch standard Synchro micro guidewire with a J-tip configuration the combination of an 055 Zoom aspiration and 136 cm catheter inside of which was a 160 cm 021 Trevo ProVue microcatheter was advanced to the  supraclinoid left ICA. The micro guidewire was then gently maneuvered with a torque device into the occluded segment of the left middle cerebral artery superior division into the distal M3 region followed by the microcatheter. The guidewire was removed. Good aspiration obtained from the hub of the microcatheter. Gentle control arteriogram performed through the microcatheter demonstrated safe position of the tip of the microcatheter with antegrade flow of contrast. This was then connected to continuous heparin saline infusion. A 4 mm x 40 mm Solitaire X retrieval device was then advanced to the distal end of the microcatheter. O ring on the delivery microcatheter was loosened. With slight forward gentle traction with the right hand on the delivery micro guidewire with the left hand the delivery microcatheter was retrieved deploying the retrieval device the proximal end of which was just proximal to the occluded inferior division. 055 Zoom aspiration catheter was advanced and engaged at the origin of the occluded branch. With proximal flow arrest in the left internal carotid artery, and with constant aspiration being applied the hub of 055 aspiration catheter with the Penumbra aspiration device, and a 20 mL syringe at the hub of the balloon guide catheter in the left internal carotid artery for approximately 2-1/2 minutes, the combination of the retrieval device, the microcatheter, and the Zoom aspiration catheter was retrieved and removed. Flow arrest was reversed in the left internal carotid artery. A control arteriogram performed through the balloon guide in the proximal left internal carotid artery demonstrated moderate spasm in the mid cervical left ICA. A 3 mm x 1 mm clot was noted entangled in the retrieval device. Wide patency was now noted of the previously occluded superior division inferior branch  achieving a TICI 3 revascularization of the left middle cerebral artery distribution. The left anterior  cerebral artery remained unchanged widely patent. Spasm noted at the distal end of the left internal carotid artery gradually resolved after 4 aliquots of 25 mcg of nitroglycerin intra-arterially. The balloon guide was then retrieved into the left common carotid artery. A final control arteriogram performed through the balloon guide catheter demonstrated near complete resolution of the previous noted spasm in the left internal carotid artery distally. The left MCA and ACA distributions continued to show complete revascularization. The balloon guide was then removed. The 8 French sheath was removed with hemostasis achieved with manual compression for approximately 20 minutes. Distal pulses remained Dopplerable in both feet unchanged. A CT of the brain demonstrated no evidence of intracranial hemorrhages. Patient was left intubated waiting the results of the COVID-19 test. IMPRESSION: Status post endovascular complete revascularization of occluded prominent side branch of the superior division of the left middle cerebral artery with 1 pass with a 4 mm x 40 mm Solitaire X retrieval device and contact aspiration achieving a TICI 3 revascularization. PLAN: Follow-up as per referring MD. Electronically Signed   By: Julieanne Cotton M.D.   On: 01/29/2021 13:05   DG CHEST PORT 1 VIEW  Result Date: 01/29/2021 CLINICAL DATA:  Endotracheal tube placement EXAM: PORTABLE CHEST 1 VIEW COMPARISON:  08/07/2018 FINDINGS: Endotracheal tube placed with tip measuring 3.6 cm above the carina. Enteric tube placed with tip not visualized off the field of view but below the left hemidiaphragm. Cardiac enlargement. Small left pleural effusion. Atelectasis or infiltration in both lung bases. No pneumothorax. Calcification of the aorta. IMPRESSION: Appliances appear in satisfactory position. Probable small left pleural effusion with atelectasis or infiltration in both lung bases. Electronically Signed   By: Burman Nieves M.D.   On:  01/29/2021 02:49   DG Abd Portable 1V  Result Date: 01/28/2021 CLINICAL DATA:  OG tube placement. EXAM: PORTABLE ABDOMEN - 1 VIEW COMPARISON:  None. FINDINGS: Tip and side port of the enteric tube below the diaphragm in the stomach. Air throughout nondilated small and large bowel in the upper abdomen. IMPRESSION: Tip and side port of the enteric tube below the diaphragm in the stomach. Electronically Signed   By: Narda Rutherford M.D.   On: 01/28/2021 18:02   EEG adult  Result Date: 02/02/2021 Charlsie Quest, MD     02/02/2021  8:41 AM Patient Name: Kenneth Yu MRN: [id number] Epilepsy Attending: Charlsie Quest Referring Physician/Provider: Dr Marvel Plan Date: 02/01/2021 Duration: 29.24mins Patient history: Per RN and speech therapies, patient this morning had acute onset left forehead headache, CT repeat showed a slight increase of hemorrhagic conversion on the left.  However, patient later has no recollection of the headache and CT repeat.  EEG to rule out a seizure episode.  Level of alertness: Awake, asleep AEDs during EEG study: None Technical aspects: This EEG study was done with scalp electrodes positioned according to the 10-20 International system of electrode placement. Electrical activity was acquired at a sampling rate of 500Hz  and reviewed with a high frequency filter of 70Hz  and a low frequency filter of 1Hz . EEG data were recorded continuously and digitally stored. Description: The posterior dominant rhythm consists of 9 Hz activity of moderate voltage (25-35 uV) seen predominantly in posterior head regions, symmetric and reactive to eye opening and eye closing. Sleep was characterized by vertex waves, sleep spindles (12 to 14 Hz), maximal frontocentral region. EEG showed continuous left frontotemporal  3 to 6 Hz theta-delta slowing. Hyperventilation and photic stimulation were not performed.   ABNORMALITY - Continuous slow,  left frontotemporal region IMPRESSION: This study is suggestive of  cortical dysfunction arising from left frontotemporal region likely secondary to underlying structural abnormality/stroke. No seizures or epileptiform discharges were seen throughout the recording. Charlsie Quest   ECHOCARDIOGRAM COMPLETE  Result Date: 01/29/2021    ECHOCARDIOGRAM REPORT   Patient Name:   Kenneth Yu Date of Exam: 01/29/2021 Medical Rec #:  [id number]     Height:       66.0 in Accession #:    [id number]    Weight:       202.2 lb Date of Birth:  1955-12-25    BSA:          2.009 m Patient Age:    64 years      BP:           143/82 mmHg Patient Gender: M             HR:           98 bpm. Exam Location:  Inpatient Procedure: 2D Echo, Cardiac Doppler, Color Doppler and Intracardiac            Opacification Agent Indications:    Stroke  History:        Patient has no prior history of Echocardiogram examinations.                 Arrythmias:Atrial Fibrillation; Risk Factors:Former Smoker. H/O                 CVA.  Sonographer:    Ross Ludwig RDCS (AE) Referring Phys: [phone] [facility]  Sonographer Comments: Suboptimal apical window. IMPRESSIONS  1. Left ventricular ejection fraction, by estimation, is 50 to 55%. The left ventricle has low normal function. The left ventricle demonstrates regional wall motion abnormalities (lateral wall hypokinesis). There is mild concentric left ventricular hypertrophy. Left ventricular diastolic parameters are consistent with Grade II diastolic dysfunction (pseudonormalization).  2. Right ventricular systolic function is normal. The right ventricular size is mildly enlarged. There is mildly elevated pulmonary artery systolic pressure. The estimated right ventricular systolic pressure is 40.5 mmHg.  3. Left atrial size was mildly dilated.  4. Right atrial size was severely dilated.  5. The mitral valve is grossly normal. Mild mitral valve regurgitation. The mean mitral valve gradient is 3.0 mmHg with average heart rate of 89 bpm.  6. The aortic valve is  tricuspid. There is mild calcification of the aortic valve. There is mild thickening of the aortic valve. Aortic valve regurgitation is not visualized. Mild aortic valve sclerosis is present, with no evidence of aortic valve stenosis.  7. The inferior vena cava is normal in size with <50% respiratory variability, suggesting right atrial pressure of 8 mmHg. FINDINGS  Left Ventricle: Left ventricular ejection fraction, by estimation, is 50 to 55%. The left ventricle has low normal function. The left ventricle demonstrates regional wall motion abnormalities. Definity contrast agent was given IV to delineate the left ventricular endocardial borders. The left ventricular internal cavity size was normal in size. There is mild concentric left ventricular hypertrophy. Left ventricular diastolic parameters are consistent with Grade II diastolic dysfunction (pseudonormalization).  LV Wall Scoring: The mid and distal lateral wall, posterior wall, mid anterolateral segment, and apical anterior segment are hypokinetic. The basal anterolateral segment is normal. Right Ventricle: The right ventricular size is mildly enlarged. No increase in right ventricular wall  thickness. Right ventricular systolic function is normal. There is mildly elevated pulmonary artery systolic pressure. The tricuspid regurgitant velocity is 2.85 m/s, and with an assumed right atrial pressure of 8 mmHg, the estimated right ventricular systolic pressure is 40.5 mmHg. Left Atrium: Left atrial size was mildly dilated. Right Atrium: Right atrial size was severely dilated. Pericardium: There is no evidence of pericardial effusion. Mitral Valve: The mitral valve is grossly normal. Mild mitral valve regurgitation. MV peak gradient, 6.8 mmHg. The mean mitral valve gradient is 3.0 mmHg with average heart rate of 89 bpm. Tricuspid Valve: The tricuspid valve is normal in structure. Tricuspid valve regurgitation is mild . No evidence of tricuspid stenosis. Aortic  Valve: The aortic valve is tricuspid. There is mild calcification of the aortic valve. There is mild thickening of the aortic valve. Aortic valve regurgitation is not visualized. Mild aortic valve sclerosis is present, with no evidence of aortic valve stenosis. Aortic valve mean gradient measures 5.0 mmHg. Aortic valve peak gradient measures 10.1 mmHg. Aortic valve area, by VTI measures 1.59 cm. Pulmonic Valve: The pulmonic valve was normal in structure. Pulmonic valve regurgitation is not visualized. No evidence of pulmonic stenosis. Aorta: The aortic root and ascending aorta are structurally normal, with no evidence of dilitation. Venous: The inferior vena cava is normal in size with less than 50% respiratory variability, suggesting right atrial pressure of 8 mmHg. IAS/Shunts: The atrial septum is grossly normal.  LEFT VENTRICLE PLAX 2D LVIDd:         5.10 cm  Diastology LVIDs:         3.60 cm  LV e' medial:    7.40 cm/s LV PW:         1.60 cm  LV E/e' medial:  15.1 LV IVS:        1.80 cm  LV e' lateral:   7.78 cm/s LVOT diam:     1.90 cm  LV E/e' lateral: 14.4 LV SV:         44 LV SV Index:   22 LVOT Area:     2.84 cm  RIGHT VENTRICLE             IVC RV Basal diam:  4.30 cm     IVC diam: 2.30 cm RV S prime:     12.30 cm/s TAPSE (M-mode): 1.4 cm LEFT ATRIUM             Index       RIGHT ATRIUM           Index LA diam:        3.60 cm 1.79 cm/m  RA Area:     36.50 cm LA Vol (A2C):   73.0 ml 36.34 ml/m RA Volume:   156.00 ml 77.65 ml/m LA Vol (A4C):   73.0 ml 36.34 ml/m LA Biplane Vol: 74.3 ml 36.98 ml/m  AORTIC VALVE AV Area (Vmax):    1.66 cm AV Area (Vmean):   1.40 cm AV Area (VTI):     1.59 cm AV Vmax:           159.00 cm/s AV Vmean:          108.000 cm/s AV VTI:            0.275 m AV Peak Grad:      10.1 mmHg AV Mean Grad:      5.0 mmHg LVOT Vmax:         93.20 cm/s LVOT Vmean:  53.200 cm/s LVOT VTI:          0.154 m LVOT/AV VTI ratio: 0.56  AORTA Ao Root diam: 3.10 cm Ao Asc diam:  3.60 cm  MITRAL VALVE                TRICUSPID VALVE MV Area (PHT): 4.41 cm     TR Peak grad:   32.5 mmHg MV Area VTI:   1.85 cm     TR Vmax:        285.00 cm/s MV Peak grad:  6.8 mmHg MV Mean grad:  3.0 mmHg     SHUNTS MV Vmax:       1.30 m/s     Systemic VTI:  0.15 m MV Vmean:      75.2 cm/s    Systemic Diam: 1.90 cm MV Decel Time: 172 msec MV E velocity: 112.00 cm/s MV A velocity: 121.00 cm/s MV E/A ratio:  0.93 Riley Lam MD Electronically signed by Riley Lam MD Signature Date/Time: 01/29/2021/12:28:37 PM    Final    IR PERCUTANEOUS ART THROMBECTOMY/INFUSION INTRACRANIAL INC DIAG ANGIO  Result Date: 01/30/2021 INDICATION: New onset aphasia with right-sided weakness. Occluded superior division of the left middle cerebral artery proximally on CT angiogram of the head and neck. EXAM: 1. EMERGENT LARGE VESSEL OCCLUSION THROMBOLYSIS (anterior CIRCULATION) COMPARISON:  CT angiogram of the head and neck of January 28, 2021. MEDICATIONS: Ancef 2 g IV antibiotic was administered within 1 hour of the procedure. ANESTHESIA/SEDATION: General anesthesia. CONTRAST:  Omnipaque 300 55 mL. FLUOROSCOPY TIME:  Fluoroscopy Time: 20 minutes 18 seconds (1160 mGy). COMPLICATIONS: None immediate. TECHNIQUE: Emergent consent was signed to physicians. No family members or next of kin were available on phone or physically. Patient was unable to provide consent due to his medical condition. The patient was then put under general anesthesia by the Department of Anesthesiology at [facility]. The right groin was prepped and draped in the usual sterile fashion. Thereafter using modified Seldinger technique, transfemoral access into the right common femoral artery was obtained without difficulty. Over a 0.035 inch guidewire an 8 [place] Pinnacle 25 cm sheath was inserted. Through this, and also over a 0.035 inch guidewire a 5 [place] JB 1 catheter was advanced to the aortic arch region and selectively positioned in the left  common carotid artery. FINDINGS: The left common carotid arteriogram demonstrates the left external carotid artery and its major branches to be widely patent. The left internal carotid artery at the bulb has mild narrowing proximally. More distally the vessel is seen to opacify to the cranial skull base. The petrous, cavernous and supraclinoid segments are patent with focal area of mild stenosis in the caval segment. The left anterior cerebral artery opacifies normally into the capillary and venous phases. The left middle cerebral artery M1 segment is widely patent. The inferior division is normal into the capillary and venous phases. The superior division demonstrates a prominent side branch occlusion proximally. PROCEDURE: The diagnostic JB 1 catheter in the left common carotid artery was then exchanged over a 0.035 inch 300 cm Rosen exchange guidewire for an 087 balloon guide catheter which had been prepped with 50% contrast and 50% heparinized saline infusion. Guidewire was removed. Good aspiration obtained from the hub of the balloon guide catheter. Gentle control arteriogram performed through this demonstrated no evidence of spasms, dissections or of intraluminal filling defects. Over a 0.035 Roadrunner guidewire, this was then advanced to the distal 1/3 of the left internal carotid artery.  The guidewire was removed. Again good aspiration obtained from the hub of the balloon guide catheter. Control arteriogram performed showed no evidence of spasms, dissections or of intraluminal filling defects. Over a 0.014 inch standard Synchro micro guidewire with a J-tip configuration the combination of an 055 Zoom aspiration and 136 cm catheter inside of which was a 160 cm 021 Trevo ProVue microcatheter was advanced to the supraclinoid left ICA. The micro guidewire was then gently maneuvered with a torque device into the occluded segment of the left middle cerebral artery superior division into the distal M3 region  followed by the microcatheter. The guidewire was removed. Good aspiration obtained from the hub of the microcatheter. Gentle control arteriogram performed through the microcatheter demonstrated safe position of the tip of the microcatheter with antegrade flow of contrast. This was then connected to continuous heparin saline infusion. A 4 mm x 40 mm Solitaire X retrieval device was then advanced to the distal end of the microcatheter. O ring on the delivery microcatheter was loosened. With slight forward gentle traction with the right hand on the delivery micro guidewire with the left hand the delivery microcatheter was retrieved deploying the retrieval device the proximal end of which was just proximal to the occluded inferior division. 055 Zoom aspiration catheter was advanced and engaged at the origin of the occluded branch. With proximal flow arrest in the left internal carotid artery, and with constant aspiration being applied the hub of 055 aspiration catheter with the Penumbra aspiration device, and a 20 mL syringe at the hub of the balloon guide catheter in the left internal carotid artery for approximately 2-1/2 minutes, the combination of the retrieval device, the microcatheter, and the Zoom aspiration catheter was retrieved and removed. Flow arrest was reversed in the left internal carotid artery. A control arteriogram performed through the balloon guide in the proximal left internal carotid artery demonstrated moderate spasm in the mid cervical left ICA. A 3 mm x 1 mm clot was noted entangled in the retrieval device. Wide patency was now noted of the previously occluded superior division inferior branch achieving a TICI 3 revascularization of the left middle cerebral artery distribution. The left anterior cerebral artery remained unchanged widely patent. Spasm noted at the distal end of the left internal carotid artery gradually resolved after 4 aliquots of 25 mcg of nitroglycerin intra-arterially. The  balloon guide was then retrieved into the left common carotid artery. A final control arteriogram performed through the balloon guide catheter demonstrated near complete resolution of the previous noted spasm in the left internal carotid artery distally. The left MCA and ACA distributions continued to show complete revascularization. The balloon guide was then removed. The 8 French sheath was removed with hemostasis achieved with manual compression for approximately 20 minutes. Distal pulses remained Dopplerable in both feet unchanged. A CT of the brain demonstrated no evidence of intracranial hemorrhages. Patient was left intubated waiting the results of the COVID-19 test. IMPRESSION: Status post endovascular complete revascularization of occluded prominent side branch of the superior division of the left middle cerebral artery with 1 pass with a 4 mm x 40 mm Solitaire X retrieval device and contact aspiration achieving a TICI 3 revascularization. PLAN: Follow-up as per referring MD. Electronically Signed   By: Julieanne Cotton M.D.   On: 01/29/2021 13:05   CT HEAD CODE STROKE WO CONTRAST  Result Date: 01/28/2021 CLINICAL DATA:  Right-sided weakness EXAM: CT ANGIOGRAPHY HEAD AND NECK CT PERFUSION BRAIN TECHNIQUE: Multidetector CT imaging of the head  and neck was performed using the standard protocol during bolus administration of intravenous contrast. Multiplanar CT image reconstructions and MIPs were obtained to evaluate the vascular anatomy. Carotid stenosis measurements (when applicable) are obtained utilizing NASCET criteria, using the distal internal carotid diameter as the denominator. Multiphase CT imaging of the brain was performed following IV bolus contrast injection. Subsequent parametric perfusion maps were calculated using RAPID software. CONTRAST:  80 mL Omnipaque 350 COMPARISON:  Report of CTA from 2015 only. Images are unavailable at the time of dictation. FINDINGS: CT HEAD Brain: There is no  acute intracranial hemorrhage or mass effect. There is hypoattenuation with loss of gray-white differentiation involving left insula and left frontoparietal lobes (pre and postcentral gyri). There is likely a chronic small left frontal cortical infarct involving the precentral gyrus. Chronic right occipitotemporal infarct. There are age-indeterminate but probably chronic small vessel infarcts of basal ganglia and central white matter bilaterally. Additional patchy and confluent areas of low-attenuation in the supratentorial white matter nonspecific but may reflect moderate chronic microvascular ischemic changes. Vascular: No definite hyperdense vessel. There may be focal hyperdensity of distal left M2 branch within the sylvian fissure, which would correspond to CTA findings. Skull: Calvarium is unremarkable. Sinuses/Orbits: No acute finding. Other: None. Review of the MIP images confirms the above findings ASPECTS [facility] Stroke Program Early CT Score) - Ganglionic level infarction (caudate, lentiform nuclei, internal capsule, insula, M1-M3 cortex): 5 - Supraganglionic infarction (M4-M6 cortex): 2 Total score (0-10 with 10 being normal): 7 CTA NECK Aortic arch: Great vessel origins are patent with mild calcified plaque. Right carotid system: Patent. Calcified plaque at the bifurcation and along the proximal internal carotid with less than 50% stenosis. Retropharyngeal course of the ICA. Left carotid system: Patent. Mixed plaque at the bifurcation and proximal internal carotid without stenosis. Retropharyngeal course of the ICA. Vertebral arteries: Patent and codominant.  No stenosis. Skeleton: Mild degenerative changes of the cervical spine. Other neck: Diffusely enlarged but homogeneous appearing thyroid. No ultrasound follow-up is recommended by current guidelines. Upper chest: Mild centrilobular emphysema. Review of the MIP images confirms the above findings CTA HEAD Anterior circulation: Intracranial internal  carotid arteries are patent with calcified plaque along the cavernous and proximal supraclinoid portions. There is moderate to marked stenosis of the proximal left cavernous segment. Additional areas of moderate stenosis bilaterally. Left M1 MCA is patent. There is distal left M2 branch occlusion within the posterior sylvian fissure. Right middle cerebral artery is patent. Anterior cerebral arteries are patent. Anterior communicating artery is present. Posterior circulation: Intracranial vertebral arteries are patent. Basilar artery is patent. Major cerebellar artery origins are patent. Left posterior cerebral artery is patent. There is significantly decreased flow within the right PCA beginning at the P2 segment probably related to prior infarct. Venous sinuses: Patent as allowed by contrast bolus timing. Review of the MIP images confirms the above findings CT Brain Perfusion Findings: CBF (<30%) Volume: 11mL Perfusion (Tmax>6.0s) volume: 39mL Mismatch Volume: 28mL Infarction Location: Left MCA territory IMPRESSION: No acute intracranial hemorrhage. Acute left MCA territory infarction with ASPECT score of 7. Distal left M2 MCA branch occlusion. Perfusion imaging demonstrates core infarction of 11 mL congruent with noncontrast CT findings. There is a calculated penumbra of 28 mL. Additional chronic findings detailed above including infarcts and microvascular ischemic changes. Plaque without hemodynamically significant stenosis at the ICA origins. Calcified plaque along the intracranial internal carotid arteries. Suspect moderate to marked stenosis of the proximal left cavernous segment. Additional areas of moderate stenosis bilaterally.  Significantly decreased flow within the right PCA beginning at the P2 segment probably related to prior infarct. Emphysema. Mild enlargement of the main pulmonary artery, which could indicate pulmonary arterial hypertension. Initial results were communicated to Dr. Amada Jupiter at  12:27 pm on 01/28/2021 by text page via the [facility] messaging system. Electronically Signed   By: Guadlupe Spanish M.D.   On: 01/28/2021 12:44   CT ANGIO HEAD NECK W WO CM W PERF (CODE STROKE)  Result Date: 01/28/2021 CLINICAL DATA:  Right-sided weakness EXAM: CT ANGIOGRAPHY HEAD AND NECK CT PERFUSION BRAIN TECHNIQUE: Multidetector CT imaging of the head and neck was performed using the standard protocol during bolus administration of intravenous contrast. Multiplanar CT image reconstructions and MIPs were obtained to evaluate the vascular anatomy. Carotid stenosis measurements (when applicable) are obtained utilizing NASCET criteria, using the distal internal carotid diameter as the denominator. Multiphase CT imaging of the brain was performed following IV bolus contrast injection. Subsequent parametric perfusion maps were calculated using RAPID software. CONTRAST:  80 mL Omnipaque 350 COMPARISON:  Report of CTA from 2015 only. Images are unavailable at the time of dictation. FINDINGS: CT HEAD Brain: There is no acute intracranial hemorrhage or mass effect. There is hypoattenuation with loss of gray-white differentiation involving left insula and left frontoparietal lobes (pre and postcentral gyri). There is likely a chronic small left frontal cortical infarct involving the precentral gyrus. Chronic right occipitotemporal infarct. There are age-indeterminate but probably chronic small vessel infarcts of basal ganglia and central white matter bilaterally. Additional patchy and confluent areas of low-attenuation in the supratentorial white matter nonspecific but may reflect moderate chronic microvascular ischemic changes. Vascular: No definite hyperdense vessel. There may be focal hyperdensity of distal left M2 branch within the sylvian fissure, which would correspond to CTA findings. Skull: Calvarium is unremarkable. Sinuses/Orbits: No acute finding. Other: None. Review of the MIP images confirms the above findings  ASPECTS [facility] Stroke Program Early CT Score) - Ganglionic level infarction (caudate, lentiform nuclei, internal capsule, insula, M1-M3 cortex): 5 - Supraganglionic infarction (M4-M6 cortex): 2 Total score (0-10 with 10 being normal): 7 CTA NECK Aortic arch: Great vessel origins are patent with mild calcified plaque. Right carotid system: Patent. Calcified plaque at the bifurcation and along the proximal internal carotid with less than 50% stenosis. Retropharyngeal course of the ICA. Left carotid system: Patent. Mixed plaque at the bifurcation and proximal internal carotid without stenosis. Retropharyngeal course of the ICA. Vertebral arteries: Patent and codominant.  No stenosis. Skeleton: Mild degenerative changes of the cervical spine. Other neck: Diffusely enlarged but homogeneous appearing thyroid. No ultrasound follow-up is recommended by current guidelines. Upper chest: Mild centrilobular emphysema. Review of the MIP images confirms the above findings CTA HEAD Anterior circulation: Intracranial internal carotid arteries are patent with calcified plaque along the cavernous and proximal supraclinoid portions. There is moderate to marked stenosis of the proximal left cavernous segment. Additional areas of moderate stenosis bilaterally. Left M1 MCA is patent. There is distal left M2 branch occlusion within the posterior sylvian fissure. Right middle cerebral artery is patent. Anterior cerebral arteries are patent. Anterior communicating artery is present. Posterior circulation: Intracranial vertebral arteries are patent. Basilar artery is patent. Major cerebellar artery origins are patent. Left posterior cerebral artery is patent. There is significantly decreased flow within the right PCA beginning at the P2 segment probably related to prior infarct. Venous sinuses: Patent as allowed by contrast bolus timing. Review of the MIP images confirms the above findings CT Brain Perfusion Findings:  CBF (<30%) Volume:  11mL Perfusion (Tmax>6.0s) volume: 39mL Mismatch Volume: 28mL Infarction Location: Left MCA territory IMPRESSION: No acute intracranial hemorrhage. Acute left MCA territory infarction with ASPECT score of 7. Distal left M2 MCA branch occlusion. Perfusion imaging demonstrates core infarction of 11 mL congruent with noncontrast CT findings. There is a calculated penumbra of 28 mL. Additional chronic findings detailed above including infarcts and microvascular ischemic changes. Plaque without hemodynamically significant stenosis at the ICA origins. Calcified plaque along the intracranial internal carotid arteries. Suspect moderate to marked stenosis of the proximal left cavernous segment. Additional areas of moderate stenosis bilaterally. Significantly decreased flow within the right PCA beginning at the P2 segment probably related to prior infarct. Emphysema. Mild enlargement of the main pulmonary artery, which could indicate pulmonary arterial hypertension. Initial results were communicated to Dr. Amada JupiterKirkpatrick at 12:27 pm on 01/28/2021 by text page via the [facility] messaging system. Electronically Signed   By: Guadlupe SpanishPraneil  Patel M.D.   On: 01/28/2021 12:44     PHYSICAL EXAM  General exam Pleasant middle-aged African-American male, no acute distress HEENT-  Normocephalic, no lesions, without obvious abnormality.  Normal external eye and conjunctiva.   Cardiovascular- regular rhythm, on tele, tachycardic    Neurologic Exam: Alert, orients to name, dob and age and place. Still with expressive aphasia, able to repeat short sentences with severe dysarthric and difficult to understand.  Follows simple commands, comprehension seems intact.  No gaze palsy, tracking bilaterally, visual field full, right facial droop, tongue protrusion to the right.  Right upper extremity is 2/5, right lower extremity 3/5.  Left upper and lower extremity 5/5.  Sensation decreased on the right.  Left finger-to-nose intact.  Gait not  tested.   ASSESSMENT/PLAN Mr. Janeece AgeeWalter Yu is a 65 y.o. male with a past medical history significant for previous stroke, DVT on Xarelto, HTN, CAD/MI, HLD [facility] by his ex-wife with right-sided weakness and aphasia. On arrival, imaging showed a distal left M2 MCA branch occlusion with a large area of penumbra and the CT perfusion is favorable for intervention and was taken for emergent thrombectomy. More recent MR this morning showed a large acute left MCA branch infarct with petechial hemorrhage.  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 Fib LDL 94  HgbA1c 5.9 Was on aspirin 81, resume aspirin 81 for stroke prevention.  No DAPT at this time given hemorrhagic transformation. 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 Glade LloydPandey) History of stroke prior to 09/2013 with  residual right-sided hemiparesis  Hx of  DVT Remote hx of DVT  On Xarelto at that time for 6-9 months and then off (per Dr. Pearlean Brownie 01/30/21 note)  Hypertension Stable Continue lisinopril, amlodipine BP goal less than 160 given hemorrhagic conversion Long-term BP goal normotensive  Hyperlipidemia LDL 94, goal < 70 Add Lipitor 40mg  daily Continue statin at discharge  Dysphagia  Passed swallow on dys 1 and thin Speech on board Avoid aspiration  Hospital day # 8   Desiree Metzger-Cihelka, ARNP-C, ANVP-BC Pager: [phone]  Stroke Neurology 02/05/2021 3:05 PM  ATTENDING NOTE: I reviewed above note and agree with the assessment and plan. Pt was seen and examined.   No acute event overnight, denies headache, neuro stable.  Patient has no bowel movement in 4 days, will do Fleet enema.  Right lower extremity 3/5 proximal and 2/5 distal, improved from prior.  Right upper extremity still flaccid, 1/5.  Still pending SNF placement.  Loop recorder pending prior to discharge.  Labs stable.  For detailed assessment and plan, please refer to above as I have made changes wherever appropriate.   04/08/2021, MD PhD Stroke Neurology 02/05/2021 11:22 PM    To contact Stroke Continuity provider, please refer to 04/08/2021. After hours, contact General Neurology

## 2021-02-05 NOTE — Progress Notes (Signed)
Report passed on to night shift nurse about enema order by MD. Night shift nurse states he will give the enema to patient  during his shift.

## 2021-02-05 NOTE — TOC Progression Note (Signed)
Transition of Care [facility]) - Progression Note    Patient Details  Name: Nyxon Strupp MRN: [id number] Date of Birth: 1956-04-12  [facility] CM/SW Contact  Baldemar Len[place], [place] Phone Number: 02/05/2021, 4:53 PM  Clinical Narrative:   CSW alerted by [facility] that they had received insurance approval for patient, but had a covid outbreak start this morning so they are unable to admit the patient. They can admit to a sister facility in [place] if family is agreeable. CSW contacted daughter Raynelle Fanning to update, and she does not want to travel to [place]. CSW provided Tesha with other bed offers and she will review and determine other SNF choice. CSW to follow.    Expected Discharge Plan: Skilled Nursing Facility Barriers to Discharge: Insurance Authorization  Expected Discharge Plan and Services Expected Discharge Plan: Skilled Nursing Facility   Discharge Planning Services: CM Consult Post Acute Care Choice: IP Rehab Living arrangements for the past 2 months: Apartment                                       Social Determinants of Health (SDOH) Interventions    Readmission Risk Interventions No flowsheet data found.

## 2021-02-05 NOTE — Progress Notes (Signed)
  Speech Language Pathology Treatment: Dysphagia  Patient Details Name: Kenneth Yu MRN: [id number] DOB: 1955/10/20 Today's Date: 02/05/2021 Time: 9373-4287 SLP Time Calculation (min) (ACUTE ONLY): 23 min  Assessment / Plan / Recommendation Clinical Impression  Pt's receptive language remains a relative strength. 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 Errors were also marked by groping behaviors and inconsistencies. SLP utilized yes/no questions PRN to understand what he was saying, particularly when he would unexpectedly change the topic of conversation. This further reduced intelligibility when context was removed. Pt was also given advanced trials of solids, with significant amounts of R anterior loss and spillage noted. Pt says that he is aware, but does not make attempts to self-manage. He will also say that he has no buccal pocketing when he does in fact have a large portion of the bolus in his R cheek. Cues were given to try to facilitate use of a lingual sweep, but manual removal was ultimately needed. As a result, will maintain current diet with ongoing SLP f/u.   HPI HPI: 65 yo male s/p R side weakness, aphasia and ?R hemianopsia.  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 PMHx: HTN, HLD.  SLP follow up for speech/language and swallowing indicated.      SLP Plan  Continue with current plan of care       Recommendations  Diet recommendations: Dysphagia 1 (puree);Thin liquid Liquids provided via: Straw Medication Administration: Crushed with puree Supervision: Intermittent supervision to cue for compensatory strategies;Patient able to self feed Compensations: Minimize environmental distractions Postural Changes and/or Swallow Maneuvers: Seated upright 90 degrees;Upright 30-60 min  after meal                Oral Care Recommendations: Oral care BID Follow up Recommendations: Skilled Nursing facility SLP Visit Diagnosis: Aphasia (R47.01);Dysphagia, oral phase (R13.11) Plan: Continue with current plan of care       GO                Mahala Menghini., M.A. CCC-SLP Acute Rehabilitation Services Pager [phone] Office [phone]  02/05/2021, 4:13 PM

## 2021-02-06 DIAGNOSIS — I63412 Cerebral infarction due to embolism of left middle cerebral artery: Secondary | ICD-10-CM | POA: Diagnosis not present

## 2021-02-06 DIAGNOSIS — I6602 Occlusion and stenosis of left middle cerebral artery: Secondary | ICD-10-CM | POA: Diagnosis not present

## 2021-02-06 NOTE — Progress Notes (Signed)
Physical Therapy Treatment Patient Details Name: Kenneth Yu MRN: [id number] DOB: [birth year] Today's Date: 02/06/2021    History of Present Illness Pt is a 65 yo male s/p R side weakness, aphasia and ?R hemianopsia.  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 7/8 syncope-- Repeat CT: Evolving recent left MCA territory infarction/hemorrhagic conversion. Also having runs of V-Tach.PMHx: HTN, HLD.    PT Comments    Patient progressing towards physical therapy goals. Seen in conjunction with OT to maximize patient's tolerance to therapies. Patient motivated to participate with therapies and attempted verbalizations during session. Worked on pre-gait activities with forward and backward stepping requiring modA+2 and blocking of R knee during stance to assist with patient trusting R LE. Updated d/c recommendation to SNF to maximize functional mobility and safety.     Follow Up Recommendations  SNF     Equipment Recommendations  Other (comment) (defer to next venue of care)    Recommendations for Other Services       Precautions / Restrictions Precautions Precautions: Fall Restrictions Weight Bearing Restrictions: No    Mobility  Bed Mobility Overal bed mobility: Needs Assistance Bed Mobility: Supine to Sit;Sit to Supine     Supine to sit: Min assist;HOB elevated Sit to supine: Min assist   General bed mobility comments: MinA to reposition hips towards EOB and mild trunk elevation. MinA to return R LE to bed    Transfers Overall transfer level: Needs assistance Equipment used: 2 person hand held assist Transfers: Sit to/from Stand Sit to Stand: Min assist;+2 physical assistance         General transfer comment: minA+2 to stand from EOB with blocking of R knee. Repeated x 2-3 during session.  Ambulation/Gait Ambulation/Gait assistance: Mod assist;+2 physical assistance           General Gait  Details: Pre-gait activities with forward/backward stepping requiring modA+2 for weight shift L and R and placement of R LE. Blocking at R knee with patient having difficulty trusting R LE to bear weight during stance to advance L LE.   Stairs             Wheelchair Mobility    Modified Rankin (Stroke Patients Only) Modified Rankin (Stroke Patients Only) Pre-Morbid Rankin Score: No symptoms Modified Rankin: Severe disability     Balance Overall balance assessment: Needs assistance Sitting-balance support: Feet supported;No upper extremity supported Sitting balance-Leahy Scale: Fair Sitting balance - Comments: able to sit statically EOB without external assist with supervision   Standing balance support: During functional activity;Bilateral upper extremity supported Standing balance-Leahy Scale: Poor Standing balance comment: reliant on external support                            Cognition Arousal/Alertness: Awake/alert Behavior During Therapy: Flat affect Overall Cognitive Status: Difficult to assess Area of Impairment: Attention;Following commands;Safety/judgement;Problem solving;Awareness                   Current Attention Level: Sustained   Following Commands: Follows one step commands consistently;Follows one step commands with increased time Safety/Judgement: Decreased awareness of deficits;Decreased awareness of safety Awareness: Emergent Problem Solving: Decreased initiation;Requires verbal cues General Comments: patient attempting verbalizations but difficult to understand. Follows simple commands with increased time.      Exercises Other Exercises Other Exercises: seated reaching towards L side outside BOS and reaching down towards ground in sitting Other Exercises: LAQ (  mid range) x 5    General Comments        Pertinent Vitals/Pain Pain Assessment: Faces Faces Pain Scale: No hurt Pain Intervention(s): Monitored during session     Home Living                      Prior Function            PT Goals (current goals can now be found in the care plan section) Acute Rehab PT Goals Patient Stated Goal: none stated but motivated to participate with therapy PT Goal Formulation: With patient Time For Goal Achievement: 02/12/21 Potential to Achieve Goals: Good Progress towards PT goals: Progressing toward goals    Frequency    Min 4X/week      PT Plan Discharge plan needs to be updated;Frequency needs to be updated    Co-evaluation PT/OT/SLP Co-Evaluation/Treatment: Yes Reason for Co-Treatment: For patient/therapist safety;To address functional/ADL transfers PT goals addressed during session: Mobility/safety with mobility;Balance;Strengthening/ROM        AM-PAC PT "6 Clicks" Mobility   Outcome Measure  Help needed turning from your back to your side while in a flat bed without using bedrails?: A Little Help needed moving from lying on your back to sitting on the side of a flat bed without using bedrails?: A Little Help needed moving to and from a bed to a chair (including a wheelchair)?: Total Help needed standing up from a chair using your arms (e.g., wheelchair or bedside chair)?: Total Help needed to walk in hospital room?: Total Help needed climbing 3-5 steps with a railing? : Total 6 Click Score: 10    End of Session Equipment Utilized During Treatment: Gait belt Activity Tolerance: Patient tolerated treatment well Patient left: in bed;with call bell/phone within reach;with bed alarm set Nurse Communication: Mobility status;Other (comment) (need for condom cath) PT Visit Diagnosis: Other abnormalities of gait and mobility (R26.89);Muscle weakness (generalized) (M62.81);Hemiplegia and hemiparesis;Unsteadiness on feet (R26.81);Difficulty in walking, not elsewhere classified (R26.2);Other symptoms and signs involving the nervous system (R29.898) Hemiplegia - Right/Left: Right Hemiplegia -  dominant/non-dominant: Dominant Hemiplegia - caused by: Cerebral infarction     Time: 9833-8250 PT Time Calculation (min) (ACUTE ONLY): 39 min  Charges:  $Gait Training: 23-37 mins                     Kenneth Yu A. Dan Humphreys PT, DPT Acute Rehabilitation Services Pager [phone] Office [phone]    Kenneth Yu Spare 02/06/2021, 4:57 PM

## 2021-02-06 NOTE — Progress Notes (Signed)
Occupational Therapy Treatment Patient Details Name: Kenneth Yu MRN: [id number] DOB: 06/10/1956 Today's Date: 02/06/2021    History of present illness Pt is a 65 yo male s/p R side weakness, aphasia and ?R hemianopsia.  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 7/8 syncope-- Repeat CT: Evolving recent left MCA territory infarction/hemorrhagic conversion. Also having runs of V-Tach.PMHx: HTN, HLD.   OT comments  Pt progressing to OOB mobility and light ADL at EOB. Pt performing dynamic sitting balance tasks with minguardA for balance. Pt improving with command follow although continues to be delayed cognitively for following multistep commands and tasks. Pt's RUE continues to be 0/5 other thatn 1/5 in R thumb for strength. Pt would benefit from continued OT skilled services. OT following acutely.    Follow Up Recommendations  SNF    Equipment Recommendations  3 in 1 bedside commode;Wheelchair (measurements OT);Wheelchair cushion (measurements OT);Hospital bed    Recommendations for Other Services      Precautions / Restrictions Precautions Precautions: Fall Restrictions Weight Bearing Restrictions: No       Mobility Bed Mobility Overal bed mobility: Needs Assistance Bed Mobility: Supine to Sit;Sit to Supine     Supine to sit: Min assist;HOB elevated Sit to supine: Min assist   General bed mobility comments: MinA to reposition hips towards EOB and mild trunk elevation. MinA to return R LE to bed    Transfers Overall transfer level: Needs assistance Equipment used: 2 person hand held assist Transfers: Sit to/from Stand Sit to Stand: Min assist;+2 physical assistance         General transfer comment: minA+2 to stand from EOB with blocking of R knee. Repeated x 2-3 during session.    Balance Overall balance assessment: Needs assistance Sitting-balance support: Feet supported;No upper extremity  supported Sitting balance-Leahy Scale: Fair Sitting balance - Comments: able to sit statically EOB without external assist with supervision Postural control: Posterior lean;Right lateral lean Standing balance support: During functional activity;Bilateral upper extremity supported Standing balance-Leahy Scale: Poor Standing balance comment: reliant on external support                           ADL either performed or assessed with clinical judgement   ADL Overall ADL's : Needs assistance/impaired   Eating/Feeding Details (indicate cue type and reason): using suction for pocketing saliva in R side of mouth Grooming: Oral care;Minimal assistance Grooming Details (indicate cue type and reason): oral care, minA                 Toilet Transfer: Moderate assistance;+2 for physical assistance Toilet Transfer Details (indicate cue type and reason): steps to [place], forward and back wards Toileting- Clothing Manipulation and Hygiene: Maximal assistance Toileting - Clothing Manipulation Details (indicate cue type and reason): holding urinal, but unable to urinate. NT aware that condom cath needs to be replaced.     Functional mobility during ADLs: Moderate assistance;+2 for physical assistance;+2 for safety/equipment;Cueing for safety;Cueing for sequencing General ADL Comments: Pt has improved since this OTR saw pt last. Pt improving with command follow. Pt continues to be delayed cognitively for following multistep commands and tasks. Pt's RUE continues to be 0/5 other thatn 1/5 in R thumb for strength.     Vision       Perception     Praxis      Cognition Arousal/Alertness: Awake/alert Behavior During Therapy: Flat affect Overall Cognitive Status:  Difficult to assess Area of Impairment: Attention;Following commands;Safety/judgement;Problem solving;Awareness                   Current Attention Level: Sustained   Following Commands: Follows one step commands  consistently;Follows one step commands with increased time Safety/Judgement: Decreased awareness of deficits;Decreased awareness of safety Awareness: Emergent Problem Solving: Decreased initiation;Requires verbal cues General Comments: patient attempting verbalizations but difficult to understand. Follows simple commands with increased time.        Exercises Exercises: Other exercises General Exercises - Upper Extremity Shoulder Flexion: PROM;10 reps;Seated;Right Elbow Flexion: PROM;10 reps;Right;Seated Elbow Extension: PROM;10 reps;Seated;Right Wrist Flexion: PROM;10 reps;Seated;Right Wrist Extension: PROM;10 reps;Seated;Right Other Exercises Other Exercises: seated reaching towards L side outside BOS and reaching down towards ground in sitting Other Exercises: LAQ (mid range) x 5   Shoulder Instructions       General Comments VSS on RA.    Pertinent Vitals/ Pain       Pain Assessment: Faces Faces Pain Scale: No hurt Pain Intervention(s): Monitored during session  Home Living                                          Prior Functioning/Environment              Frequency  Min 2X/week        Progress Toward Goals  OT Goals(current goals can now be found in the care plan section)  Progress towards OT goals: Progressing toward goals  Acute Rehab OT Goals Patient Stated Goal: none stated but motivated to participate with therapy OT Goal Formulation: With patient Time For Goal Achievement: 02/13/21 Potential to Achieve Goals: Fair  Plan Discharge plan remains appropriate    Co-evaluation    PT/OT/SLP Co-Evaluation/Treatment: Yes Reason for Co-Treatment: For patient/therapist safety;To address functional/ADL transfers PT goals addressed during session: Mobility/safety with mobility;Balance;Strengthening/ROM OT goals addressed during session: ADL's and self-care      AM-PAC OT "6 Clicks" Daily Activity     Outcome Measure   Help from  another person eating meals?: A Little Help from another person taking care of personal grooming?: A Little Help from another person toileting, which includes using toliet, bedpan, or urinal?: A Lot Help from another person bathing (including washing, rinsing, drying)?: A Lot Help from another person to put on and taking off regular upper body clothing?: A Lot Help from another person to put on and taking off regular lower body clothing?: A Lot 6 Click Score: 14    End of Session    OT Visit Diagnosis: Unsteadiness on feet (R26.81);Muscle weakness (generalized) (M62.81);Hemiplegia and hemiparesis;Other symptoms and signs involving cognitive function Hemiplegia - Right/Left: Right Hemiplegia - dominant/non-dominant: Dominant Hemiplegia - caused by: Cerebral infarction   Activity Tolerance Patient tolerated treatment well   Patient Left in bed;with call bell/phone within reach;with bed alarm set   Nurse Communication Mobility status        Time: 3646-8032 OT Time Calculation (min): 40 min  Charges: OT General Charges $OT Visit: 1 Visit OT Treatments $Self Care/Home Management : 8-22 mins $Neuromuscular Re-education: 8-22 mins  Flora Lipps, OTR/L Acute Rehabilitation Services Pager: [phone] Office: [phone]    Alilah Mcmeans C 02/06/2021, 5:09 PM

## 2021-02-06 NOTE — Progress Notes (Addendum)
STROKE TEAM PROGRESS NOTE   SUBJECTIVE (INTERVAL HISTORY) No family at bedside.  Pt doing well and is active in his care. SNF choice had to be changed today d/t covid outbreak at first facility. New SNF approved today and will plan d/c tomorrow afternoon after loop placed.  OBJECTIVE Vitals:   02/05/21 2318 02/06/21 0332 02/06/21 0812 02/06/21 1148  BP: 138/76 116/63 (!) 131/94 118/76  Pulse: 87 76 79 86  Resp: 16 16 17 19   Temp: 99.8 F (37.7 C) 98.1 F (36.7 C) 98.3 F (36.8 C) 98.4 F (36.9 C)  TempSrc: Oral Oral    SpO2: 97% 98% 93% 95%  Weight:      Height:        CBC:  Recent Labs  Lab 02/04/21 0408 02/05/21 0334  WBC 7.2 7.4  HGB 15.2 15.2  HCT 46.4 45.6  MCV 87.9 87.2  PLT 181 191     Basic Metabolic Panel:  Recent Labs  Lab 02/04/21 0408 02/05/21 0334  NA 142 142  K 4.6 4.2  CL 106 106  CO2 27 27  GLUCOSE 90 98  BUN 16 18  CREATININE 1.13 1.20  CALCIUM 9.4 9.3     Lipid Panel:  No results for input(s): CHOL, TRIG, HDL, CHOLHDL, VLDL, LDLCALC in the last 168 hours.  HgbA1c:  Lab Results  Component Value Date   HGBA1C 5.9 (H) 01/29/2021    IMAGING CT HEAD WO CONTRAST  Result Date: 02/03/2021 CLINICAL DATA:  Stroke follow-up EXAM: CT HEAD WITHOUT CONTRAST TECHNIQUE: Contiguous axial images were obtained from the base of the skull through the vertex without intravenous contrast. COMPARISON:  Two days ago FINDINGS: Brain: Left MCA branch infarct in the lateral and posterior frontal lobe with petechial hemorrhage. No progressive finding. Background of advanced chronic small vessel ischemia with confluent gliosis and chronic lacunar infarct at the right basal ganglia. Remote right occipital cortex infarct. Remote right cerebellar infarct. No hydrocephalus or masslike finding Vascular: Stable Skull: Normal. Negative for fracture or focal lesion. Sinuses/Orbits: Bilateral cataract resection IMPRESSION: No adverse evolution of the left MCA branch infarct  with petechial hemorrhage. Electronically Signed   By: 04/06/2021 M.D.   On: 02/03/2021 10:32   CT HEAD WO CONTRAST  Result Date: 02/01/2021 CLINICAL DATA:  Left frontal headache EXAM: CT HEAD WITHOUT CONTRAST TECHNIQUE: Contiguous axial images were obtained from the base of the skull through the vertex without intravenous contrast. COMPARISON:  01/30/2021 FINDINGS: Brain: Evolving left MCA territory infarction is again identified. Gyriform hyperdensity is again noted reflecting petechial hemorrhage. This does not appear substantially changed from the prior study. Additional smaller acute infarcts are better seen on the prior MRI. Chronic infarcts including involvement of right occipital lobe, right medial temporal lobe, right caudate, and right cerebellum are again identified. Stable findings of probable chronic microvascular ischemic changes. No significant mass effect.  No hydrocephalus. Vascular: No new finding. Skull: Calvarium is unremarkable. Sinuses/Orbits: No acute finding. Other: None. IMPRESSION: Evolving recent left MCA territory infarction with similar petechial hemorrhage. No significant mass effect. Electronically Signed   By: 04/02/2021 M.D.   On: 02/01/2021 09:32   CT Head Wo Contrast  Result Date: 01/30/2021 CLINICAL DATA:  Stroke follow-up. EXAM: CT HEAD WITHOUT CONTRAST TECHNIQUE: Contiguous axial images were obtained from the base of the skull through the vertex without intravenous contrast. COMPARISON:  Brain MRI 01/29/2021. Noncontrast head CT and CT angiogram head/neck 01/28/2021. FINDINGS: Brain: Mild generalized parenchymal atrophy. Redemonstrated evolving acute cortically  based left MCA territory infarct within the left frontoparietal lobes and left insula. Redemonstrated mild corresponding petechial hemorrhage. Small acute infarcts were better appreciated on the prior MRI. Chronic cortically based right occipital temporal lobe infarct. Chronic lacunar infarcts within the  bilateral corona radiata and deep gray nuclei. Stable background moderate/advanced chronic small vessel ischemic disease within the cerebral white matter. Redemonstrated small chronic infarct within the right cerebellar hemisphere. No extra-axial fluid collection. No evidence of an intracranial mass. No midline shift. Vascular: Atherosclerotic calcifications Skull: Normal. Negative for fracture or focal lesion. Sinuses/Orbits: Visualized orbits show no acute finding. Trace mucosal thickening within the right ethmoid air cells. Fluid levels and background mild mucosal thickening within the right greater than left sphenoid sinuses. Trace left maxillary sinus mucosal thickening. IMPRESSION: Evolving moderate to large acute left MCA territory infarct, not appreciably changed in extent from the brain MRI of 01/29/2021. As before, there is mild petechial hemorrhage within the infarction territory. Associated small acute infarcts within the left caudate nucleus were better appreciated on the prior MRI. Otherwise stable non-contrast CT appearance of the brain. 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 Background chronic small vessel ischemic disease with multiple chronic lacunar infarcts. Small chronic infarct within the right cerebellar hemisphere. Mild generalized cerebral atrophy. Paranasal sinus disease, as described. Electronically Signed   By: Jackey Loge DO   On: 01/30/2021 08:30   MR BRAIN WO CONTRAST  Result Date: 01/29/2021 CLINICAL DATA:  Right-sided weakness.  History of stroke EXAM: MRI HEAD WITHOUT CONTRAST TECHNIQUE: Multiplanar, multiecho pulse sequences of the brain and surrounding structures were obtained without intravenous contrast. COMPARISON:  CT and CTA from yesterday FINDINGS: Brain: Large area of cortically based infarction in the lateral left frontal lobe, MCA branch distribution. Minimal patchy left caudate acute infarcts. Petechial hemorrhage is present. Remote cortically based  infarct affecting the right occipital lobe. Chronic lacunar infarcts in the bilateral deep gray nuclei. Chronic small right cerebellar infarct. Confluent chronic small vessel ischemic gliosis in the deep white matter. Brain volume is normal. Chronic micro hemorrhages which are mainly peripheral but in areas that were likely affected by prior infarction. No hydrocephalus, collection, or masslike finding. Vascular: Preserved flow voids. Skull and upper cervical spine: Normal marrow signal Sinuses/Orbits: Nasopharyngeal and sinus opacification in the setting of intubation. Bilateral cataract resection. IMPRESSION: 1. Large acute left MCA branch infarct with petechial hemorrhage. Minimal involvement in the left caudate head. 2. Chronic small vessel disease with chronic lacunar infarcts. Prior right PCA territory infarct. Electronically Signed   By: Marnee Spring M.D.   On: 01/29/2021 05:29   IR CT Head Ltd  Result Date: 01/30/2021 INDICATION: New onset aphasia with right-sided weakness. Occluded superior division of the left middle cerebral artery proximally on CT angiogram of the head and neck. EXAM: 1. EMERGENT LARGE VESSEL OCCLUSION THROMBOLYSIS (anterior CIRCULATION) COMPARISON:  CT angiogram of the head and neck of January 28, 2021. MEDICATIONS: Ancef 2 g IV antibiotic was administered within 1 hour of the procedure. ANESTHESIA/SEDATION: General anesthesia. CONTRAST:  Omnipaque 300 55 mL. FLUOROSCOPY TIME:  Fluoroscopy Time: 20 minutes 18 seconds (1160 mGy). COMPLICATIONS: None immediate. TECHNIQUE: Emergent consent was signed to physicians. No family members or next of kin were available on phone or physically. Patient was unable to provide consent due to his medical condition. The patient was then put under general anesthesia by the Department of Anesthesiology at [facility]. The right groin was prepped and draped in the usual sterile fashion. Thereafter using  modified Seldinger technique, transfemoral  access into the right common femoral artery was obtained without difficulty. Over a 0.035 inch guidewire an 8 [place] Pinnacle 25 cm sheath was inserted. Through this, and also over a 0.035 inch guidewire a 5 [place] JB 1 catheter was advanced to the aortic arch region and selectively positioned in the left common carotid artery. FINDINGS: The left common carotid arteriogram demonstrates the left external carotid artery and its major branches to be widely patent. The left internal carotid artery at the bulb has mild narrowing proximally. More distally the vessel is seen to opacify to the cranial skull base. The petrous, cavernous and supraclinoid segments are patent with focal area of mild stenosis in the caval segment. The left anterior cerebral artery opacifies normally into the capillary and venous phases. The left middle cerebral artery M1 segment is widely patent. The inferior division is normal into the capillary and venous phases. The superior division demonstrates a prominent side branch occlusion proximally. PROCEDURE: The diagnostic JB 1 catheter in the left common carotid artery was then exchanged over a 0.035 inch 300 cm Rosen exchange guidewire for an 087 balloon guide catheter which had been prepped with 50% contrast and 50% heparinized saline infusion. Guidewire was removed. Good aspiration obtained from the hub of the balloon guide catheter. Gentle control arteriogram performed through this demonstrated no evidence of spasms, dissections or of intraluminal filling defects. Over a 0.035 Roadrunner guidewire, this was then advanced to the distal 1/3 of the left internal carotid artery. The guidewire was removed. Again good aspiration obtained from the hub of the balloon guide catheter. Control arteriogram performed showed no evidence of spasms, dissections or of intraluminal filling defects. Over a 0.014 inch standard Synchro micro guidewire with a J-tip configuration the combination of an 055 Zoom  aspiration and 136 cm catheter inside of which was a 160 cm 021 Trevo ProVue microcatheter was advanced to the supraclinoid left ICA. The micro guidewire was then gently maneuvered with a torque device into the occluded segment of the left middle cerebral artery superior division into the distal M3 region followed by the microcatheter. The guidewire was removed. Good aspiration obtained from the hub of the microcatheter. Gentle control arteriogram performed through the microcatheter demonstrated safe position of the tip of the microcatheter with antegrade flow of contrast. This was then connected to continuous heparin saline infusion. A 4 mm x 40 mm Solitaire X retrieval device was then advanced to the distal end of the microcatheter. O ring on the delivery microcatheter was loosened. With slight forward gentle traction with the right hand on the delivery micro guidewire with the left hand the delivery microcatheter was retrieved deploying the retrieval device the proximal end of which was just proximal to the occluded inferior division. 055 Zoom aspiration catheter was advanced and engaged at the origin of the occluded branch. With proximal flow arrest in the left internal carotid artery, and with constant aspiration being applied the hub of 055 aspiration catheter with the Penumbra aspiration device, and a 20 mL syringe at the hub of the balloon guide catheter in the left internal carotid artery for approximately 2-1/2 minutes, the combination of the retrieval device, the microcatheter, and the Zoom aspiration catheter was retrieved and removed. Flow arrest was reversed in the left internal carotid artery. A control arteriogram performed through the balloon guide in the proximal left internal carotid artery demonstrated moderate spasm in the mid cervical left ICA. A 3 mm x 1 mm clot was noted  entangled in the retrieval device. Wide patency was now noted of the previously occluded superior division inferior branch  achieving a TICI 3 revascularization of the left middle cerebral artery distribution. The left anterior cerebral artery remained unchanged widely patent. Spasm noted at the distal end of the left internal carotid artery gradually resolved after 4 aliquots of 25 mcg of nitroglycerin intra-arterially. The balloon guide was then retrieved into the left common carotid artery. A final control arteriogram performed through the balloon guide catheter demonstrated near complete resolution of the previous noted spasm in the left internal carotid artery distally. The left MCA and ACA distributions continued to show complete revascularization. The balloon guide was then removed. The 8 French sheath was removed with hemostasis achieved with manual compression for approximately 20 minutes. Distal pulses remained Dopplerable in both feet unchanged. A CT of the brain demonstrated no evidence of intracranial hemorrhages. Patient was left intubated waiting the results of the COVID-19 test. IMPRESSION: Status post endovascular complete revascularization of occluded prominent side branch of the superior division of the left middle cerebral artery with 1 pass with a 4 mm x 40 mm Solitaire X retrieval device and contact aspiration achieving a TICI 3 revascularization. PLAN: Follow-up as per referring MD. Electronically Signed   By: Julieanne Cotton M.D.   On: 01/29/2021 13:05   DG CHEST PORT 1 VIEW  Result Date: 01/29/2021 CLINICAL DATA:  Endotracheal tube placement EXAM: PORTABLE CHEST 1 VIEW COMPARISON:  08/07/2018 FINDINGS: Endotracheal tube placed with tip measuring 3.6 cm above the carina. Enteric tube placed with tip not visualized off the field of view but below the left hemidiaphragm. Cardiac enlargement. Small left pleural effusion. Atelectasis or infiltration in both lung bases. No pneumothorax. Calcification of the aorta. IMPRESSION: Appliances appear in satisfactory position. Probable small left pleural effusion with  atelectasis or infiltration in both lung bases. Electronically Signed   By: Burman Nieves M.D.   On: 01/29/2021 02:49   DG Abd Portable 1V  Result Date: 01/28/2021 CLINICAL DATA:  OG tube placement. EXAM: PORTABLE ABDOMEN - 1 VIEW COMPARISON:  None. FINDINGS: Tip and side port of the enteric tube below the diaphragm in the stomach. Air throughout nondilated small and large bowel in the upper abdomen. IMPRESSION: Tip and side port of the enteric tube below the diaphragm in the stomach. Electronically Signed   By: Narda Rutherford M.D.   On: 01/28/2021 18:02   EEG adult  Result Date: 02/02/2021 Charlsie Quest, MD     02/02/2021  8:41 AM Patient Name: Loye Vento MRN: [id number] Epilepsy Attending: Charlsie Quest Referring Physician/Provider: Dr Marvel Plan Date: 02/01/2021 Duration: 29.68mins Patient history: Per RN and speech therapies, patient this morning had acute onset left forehead headache, CT repeat showed a slight increase of hemorrhagic conversion on the left.  However, patient later has no recollection of the headache and CT repeat.  EEG to rule out a seizure episode.  Level of alertness: Awake, asleep AEDs during EEG study: None Technical aspects: This EEG study was done with scalp electrodes positioned according to the 10-20 International system of electrode placement. Electrical activity was acquired at a sampling rate of 500Hz  and reviewed with a high frequency filter of 70Hz  and a low frequency filter of 1Hz . EEG data were recorded continuously and digitally stored. Description: The posterior dominant rhythm consists of 9 Hz activity of moderate voltage (25-35 uV) seen predominantly in posterior head regions, symmetric and reactive to eye opening and eye closing. Sleep was  characterized by vertex waves, sleep spindles (12 to 14 Hz), maximal frontocentral region. EEG showed continuous left frontotemporal 3 to 6 Hz theta-delta slowing. Hyperventilation and photic stimulation were not performed.    ABNORMALITY - Continuous slow,  left frontotemporal region IMPRESSION: This study is suggestive of cortical dysfunction arising from left frontotemporal region likely secondary to underlying structural abnormality/stroke. No seizures or epileptiform discharges were seen throughout the recording. Charlsie Quest   ECHOCARDIOGRAM COMPLETE  Result Date: 01/29/2021    ECHOCARDIOGRAM REPORT   Patient Name:   IVIN ROSENBLOOM Date of Exam: 01/29/2021 Medical Rec #:  [id number]     Height:       66.0 in Accession #:    [id number]    Weight:       202.2 lb Date of Birth:  April 06, 1956    BSA:          2.009 m Patient Age:    64 years      BP:           143/82 mmHg Patient Gender: M             HR:           98 bpm. Exam Location:  Inpatient Procedure: 2D Echo, Cardiac Doppler, Color Doppler and Intracardiac            Opacification Agent Indications:    Stroke  History:        Patient has no prior history of Echocardiogram examinations.                 Arrythmias:Atrial Fibrillation; Risk Factors:Former Smoker. H/O                 CVA.  Sonographer:    Ross Ludwig RDCS (AE) Referring Phys: [phone] MCNEILL P [facility]  Sonographer Comments: Suboptimal apical window. IMPRESSIONS  1. Left ventricular ejection fraction, by estimation, is 50 to 55%. The left ventricle has low normal function. The left ventricle demonstrates regional wall motion abnormalities (lateral wall hypokinesis). There is mild concentric left ventricular hypertrophy. Left ventricular diastolic parameters are consistent with Grade II diastolic dysfunction (pseudonormalization).  2. Right ventricular systolic function is normal. The right ventricular size is mildly enlarged. There is mildly elevated pulmonary artery systolic pressure. The estimated right ventricular systolic pressure is 40.5 mmHg.  3. Left atrial size was mildly dilated.  4. Right atrial size was severely dilated.  5. The mitral valve is grossly normal. Mild mitral valve regurgitation. The mean  mitral valve gradient is 3.0 mmHg with average heart rate of 89 bpm.  6. The aortic valve is tricuspid. There is mild calcification of the aortic valve. There is mild thickening of the aortic valve. Aortic valve regurgitation is not visualized. Mild aortic valve sclerosis is present, with no evidence of aortic valve stenosis.  7. The inferior vena cava is normal in size with <50% respiratory variability, suggesting right atrial pressure of 8 mmHg. FINDINGS  Left Ventricle: Left ventricular ejection fraction, by estimation, is 50 to 55%. The left ventricle has low normal function. The left ventricle demonstrates regional wall motion abnormalities. Definity contrast agent was given IV to delineate the left ventricular endocardial borders. The left ventricular internal cavity size was normal in size. There is mild concentric left ventricular hypertrophy. Left ventricular diastolic parameters are consistent with Grade II diastolic dysfunction (pseudonormalization).  LV Wall Scoring: The mid and distal lateral wall, posterior wall, mid anterolateral segment, and apical anterior segment are hypokinetic. The basal anterolateral  segment is normal. Right Ventricle: The right ventricular size is mildly enlarged. No increase in right ventricular wall thickness. Right ventricular systolic function is normal. There is mildly elevated pulmonary artery systolic pressure. The tricuspid regurgitant velocity is 2.85 m/s, and with an assumed right atrial pressure of 8 mmHg, the estimated right ventricular systolic pressure is 40.5 mmHg. Left Atrium: Left atrial size was mildly dilated. Right Atrium: Right atrial size was severely dilated. Pericardium: There is no evidence of pericardial effusion. Mitral Valve: The mitral valve is grossly normal. Mild mitral valve regurgitation. MV peak gradient, 6.8 mmHg. The mean mitral valve gradient is 3.0 mmHg with average heart rate of 89 bpm. Tricuspid Valve: The tricuspid valve is normal in  structure. Tricuspid valve regurgitation is mild . No evidence of tricuspid stenosis. Aortic Valve: The aortic valve is tricuspid. There is mild calcification of the aortic valve. There is mild thickening of the aortic valve. Aortic valve regurgitation is not visualized. Mild aortic valve sclerosis is present, with no evidence of aortic valve stenosis. Aortic valve mean gradient measures 5.0 mmHg. Aortic valve peak gradient measures 10.1 mmHg. Aortic valve area, by VTI measures 1.59 cm. Pulmonic Valve: The pulmonic valve was normal in structure. Pulmonic valve regurgitation is not visualized. No evidence of pulmonic stenosis. Aorta: The aortic root and ascending aorta are structurally normal, with no evidence of dilitation. Venous: The inferior vena cava is normal in size with less than 50% respiratory variability, suggesting right atrial pressure of 8 mmHg. IAS/Shunts: The atrial septum is grossly normal.  LEFT VENTRICLE PLAX 2D LVIDd:         5.10 cm  Diastology LVIDs:         3.60 cm  LV e' medial:    7.40 cm/s LV PW:         1.60 cm  LV E/e' medial:  15.1 LV IVS:        1.80 cm  LV e' lateral:   7.78 cm/s LVOT diam:     1.90 cm  LV E/e' lateral: 14.4 LV SV:         44 LV SV Index:   22 LVOT Area:     2.84 cm  RIGHT VENTRICLE             IVC RV Basal diam:  4.30 cm     IVC diam: 2.30 cm RV S prime:     12.30 cm/s TAPSE (M-mode): 1.4 cm LEFT ATRIUM             Index       RIGHT ATRIUM           Index LA diam:        3.60 cm 1.79 cm/m  RA Area:     36.50 cm LA Vol (A2C):   73.0 ml 36.34 ml/m RA Volume:   156.00 ml 77.65 ml/m LA Vol (A4C):   73.0 ml 36.34 ml/m LA Biplane Vol: 74.3 ml 36.98 ml/m  AORTIC VALVE AV Area (Vmax):    1.66 cm AV Area (Vmean):   1.40 cm AV Area (VTI):     1.59 cm AV Vmax:           159.00 cm/s AV Vmean:          108.000 cm/s AV VTI:            0.275 m AV Peak Grad:      10.1 mmHg AV Mean Grad:      5.0 mmHg LVOT Vmax:  93.20 cm/s LVOT Vmean:        53.200 cm/s LVOT VTI:           0.154 m LVOT/AV VTI ratio: 0.56  AORTA Ao Root diam: 3.10 cm Ao Asc diam:  3.60 cm MITRAL VALVE                TRICUSPID VALVE MV Area (PHT): 4.41 cm     TR Peak grad:   32.5 mmHg MV Area VTI:   1.85 cm     TR Vmax:        285.00 cm/s MV Peak grad:  6.8 mmHg MV Mean grad:  3.0 mmHg     SHUNTS MV Vmax:       1.30 m/s     Systemic VTI:  0.15 m MV Vmean:      75.2 cm/s    Systemic Diam: 1.90 cm MV Decel Time: 172 msec MV E velocity: 112.00 cm/s MV A velocity: 121.00 cm/s MV E/A ratio:  0.93 Riley Lam MD Electronically signed by Riley Lam MD Signature Date/Time: 01/29/2021/12:28:37 PM    Final    IR PERCUTANEOUS ART THROMBECTOMY/INFUSION INTRACRANIAL INC DIAG ANGIO  Result Date: 01/30/2021 INDICATION: New onset aphasia with right-sided weakness. Occluded superior division of the left middle cerebral artery proximally on CT angiogram of the head and neck. EXAM: 1. EMERGENT LARGE VESSEL OCCLUSION THROMBOLYSIS (anterior CIRCULATION) COMPARISON:  CT angiogram of the head and neck of January 28, 2021. MEDICATIONS: Ancef 2 g IV antibiotic was administered within 1 hour of the procedure. ANESTHESIA/SEDATION: General anesthesia. CONTRAST:  Omnipaque 300 55 mL. FLUOROSCOPY TIME:  Fluoroscopy Time: 20 minutes 18 seconds (1160 mGy). COMPLICATIONS: None immediate. TECHNIQUE: Emergent consent was signed to physicians. No family members or next of kin were available on phone or physically. Patient was unable to provide consent due to his medical condition. The patient was then put under general anesthesia by the Department of Anesthesiology at [facility]. The right groin was prepped and draped in the usual sterile fashion. Thereafter using modified Seldinger technique, transfemoral access into the right common femoral artery was obtained without difficulty. Over a 0.035 inch guidewire an 8 [place] Pinnacle 25 cm sheath was inserted. Through this, and also over a 0.035 inch guidewire a 5 [place] JB 1  catheter was advanced to the aortic arch region and selectively positioned in the left common carotid artery. FINDINGS: The left common carotid arteriogram demonstrates the left external carotid artery and its major branches to be widely patent. The left internal carotid artery at the bulb has mild narrowing proximally. More distally the vessel is seen to opacify to the cranial skull base. The petrous, cavernous and supraclinoid segments are patent with focal area of mild stenosis in the caval segment. The left anterior cerebral artery opacifies normally into the capillary and venous phases. The left middle cerebral artery M1 segment is widely patent. The inferior division is normal into the capillary and venous phases. The superior division demonstrates a prominent side branch occlusion proximally. PROCEDURE: The diagnostic JB 1 catheter in the left common carotid artery was then exchanged over a 0.035 inch 300 cm Rosen exchange guidewire for an 087 balloon guide catheter which had been prepped with 50% contrast and 50% heparinized saline infusion. Guidewire was removed. Good aspiration obtained from the hub of the balloon guide catheter. Gentle control arteriogram performed through this demonstrated no evidence of spasms, dissections or of intraluminal filling defects. Over a 0.035 Roadrunner guidewire, this was then  advanced to the distal 1/3 of the left internal carotid artery. The guidewire was removed. Again good aspiration obtained from the hub of the balloon guide catheter. Control arteriogram performed showed no evidence of spasms, dissections or of intraluminal filling defects. Over a 0.014 inch standard Synchro micro guidewire with a J-tip configuration the combination of an 055 Zoom aspiration and 136 cm catheter inside of which was a 160 cm 021 Trevo ProVue microcatheter was advanced to the supraclinoid left ICA. The micro guidewire was then gently maneuvered with a torque device into the occluded  segment of the left middle cerebral artery superior division into the distal M3 region followed by the microcatheter. The guidewire was removed. Good aspiration obtained from the hub of the microcatheter. Gentle control arteriogram performed through the microcatheter demonstrated safe position of the tip of the microcatheter with antegrade flow of contrast. This was then connected to continuous heparin saline infusion. A 4 mm x 40 mm Solitaire X retrieval device was then advanced to the distal end of the microcatheter. O ring on the delivery microcatheter was loosened. With slight forward gentle traction with the right hand on the delivery micro guidewire with the left hand the delivery microcatheter was retrieved deploying the retrieval device the proximal end of which was just proximal to the occluded inferior division. 055 Zoom aspiration catheter was advanced and engaged at the origin of the occluded branch. With proximal flow arrest in the left internal carotid artery, and with constant aspiration being applied the hub of 055 aspiration catheter with the Penumbra aspiration device, and a 20 mL syringe at the hub of the balloon guide catheter in the left internal carotid artery for approximately 2-1/2 minutes, the combination of the retrieval device, the microcatheter, and the Zoom aspiration catheter was retrieved and removed. Flow arrest was reversed in the left internal carotid artery. A control arteriogram performed through the balloon guide in the proximal left internal carotid artery demonstrated moderate spasm in the mid cervical left ICA. A 3 mm x 1 mm clot was noted entangled in the retrieval device. Wide patency was now noted of the previously occluded superior division inferior branch achieving a TICI 3 revascularization of the left middle cerebral artery distribution. The left anterior cerebral artery remained unchanged widely patent. Spasm noted at the distal end of the left internal carotid artery  gradually resolved after 4 aliquots of 25 mcg of nitroglycerin intra-arterially. The balloon guide was then retrieved into the left common carotid artery. A final control arteriogram performed through the balloon guide catheter demonstrated near complete resolution of the previous noted spasm in the left internal carotid artery distally. The left MCA and ACA distributions continued to show complete revascularization. The balloon guide was then removed. The 8 French sheath was removed with hemostasis achieved with manual compression for approximately 20 minutes. Distal pulses remained Dopplerable in both feet unchanged. A CT of the brain demonstrated no evidence of intracranial hemorrhages. Patient was left intubated waiting the results of the COVID-19 test. IMPRESSION: Status post endovascular complete revascularization of occluded prominent side branch of the superior division of the left middle cerebral artery with 1 pass with a 4 mm x 40 mm Solitaire X retrieval device and contact aspiration achieving a TICI 3 revascularization. PLAN: Follow-up as per referring MD. Electronically Signed   By: Julieanne Cotton M.D.   On: 01/29/2021 13:05   CT HEAD CODE STROKE WO CONTRAST  Result Date: 01/28/2021 CLINICAL DATA:  Right-sided weakness EXAM: CT ANGIOGRAPHY HEAD AND  NECK CT PERFUSION BRAIN TECHNIQUE: Multidetector CT imaging of the head and neck was performed using the standard protocol during bolus administration of intravenous contrast. Multiplanar CT image reconstructions and MIPs were obtained to evaluate the vascular anatomy. Carotid stenosis measurements (when applicable) are obtained utilizing NASCET criteria, using the distal internal carotid diameter as the denominator. Multiphase CT imaging of the brain was performed following IV bolus contrast injection. Subsequent parametric perfusion maps were calculated using RAPID software. CONTRAST:  80 mL Omnipaque 350 COMPARISON:  Report of CTA from 2015 only.  Images are unavailable at the time of dictation. FINDINGS: CT HEAD Brain: There is no acute intracranial hemorrhage or mass effect. There is hypoattenuation with loss of gray-white differentiation involving left insula and left frontoparietal lobes (pre and postcentral gyri). There is likely a chronic small left frontal cortical infarct involving the precentral gyrus. Chronic right occipitotemporal infarct. There are age-indeterminate but probably chronic small vessel infarcts of basal ganglia and central white matter bilaterally. Additional patchy and confluent areas of low-attenuation in the supratentorial white matter nonspecific but may reflect moderate chronic microvascular ischemic changes. Vascular: No definite hyperdense vessel. There may be focal hyperdensity of distal left M2 branch within the sylvian fissure, which would correspond to CTA findings. Skull: Calvarium is unremarkable. Sinuses/Orbits: No acute finding. Other: None. Review of the MIP images confirms the above findings ASPECTS [facility]([place] Stroke Program Early CT Score) - Ganglionic level infarction (caudate, lentiform nuclei, internal capsule, insula, M1-M3 cortex): 5 - Supraganglionic infarction (M4-M6 cortex): 2 Total score (0-10 with 10 being normal): 7 CTA NECK Aortic arch: Great vessel origins are patent with mild calcified plaque. Right carotid system: Patent. Calcified plaque at the bifurcation and along the proximal internal carotid with less than 50% stenosis. Retropharyngeal course of the ICA. Left carotid system: Patent. Mixed plaque at the bifurcation and proximal internal carotid without stenosis. Retropharyngeal course of the ICA. Vertebral arteries: Patent and codominant.  No stenosis. Skeleton: Mild degenerative changes of the cervical spine. Other neck: Diffusely enlarged but homogeneous appearing thyroid. No ultrasound follow-up is recommended by current guidelines. Upper chest: Mild centrilobular emphysema. Review of the MIP  images confirms the above findings CTA HEAD Anterior circulation: Intracranial internal carotid arteries are patent with calcified plaque along the cavernous and proximal supraclinoid portions. There is moderate to marked stenosis of the proximal left cavernous segment. Additional areas of moderate stenosis bilaterally. Left M1 MCA is patent. There is distal left M2 branch occlusion within the posterior sylvian fissure. Right middle cerebral artery is patent. Anterior cerebral arteries are patent. Anterior communicating artery is present. Posterior circulation: Intracranial vertebral arteries are patent. Basilar artery is patent. Major cerebellar artery origins are patent. Left posterior cerebral artery is patent. There is significantly decreased flow within the right PCA beginning at the P2 segment probably related to prior infarct. Venous sinuses: Patent as allowed by contrast bolus timing. Review of the MIP images confirms the above findings CT Brain Perfusion Findings: CBF (<30%) Volume: 11mL Perfusion (Tmax>6.0s) volume: 39mL Mismatch Volume: 28mL Infarction Location: Left MCA territory IMPRESSION: No acute intracranial hemorrhage. Acute left MCA territory infarction with ASPECT score of 7. Distal left M2 MCA branch occlusion. Perfusion imaging demonstrates core infarction of 11 mL congruent with noncontrast CT findings. There is a calculated penumbra of 28 mL. Additional chronic findings detailed above including infarcts and microvascular ischemic changes. Plaque without hemodynamically significant stenosis at the ICA origins. Calcified plaque along the intracranial internal carotid arteries. Suspect moderate to marked stenosis of  the proximal left cavernous segment. Additional areas of moderate stenosis bilaterally. Significantly decreased flow within the right PCA beginning at the P2 segment probably related to prior infarct. Emphysema. Mild enlargement of the main pulmonary artery, which could indicate  pulmonary arterial hypertension. Initial results were communicated to Dr. Amada Jupiter at 12:27 pm on 01/28/2021 by text page via the [facility] messaging system. Electronically Signed   By: Guadlupe Spanish M.D.   On: 01/28/2021 12:44   CT ANGIO HEAD NECK W WO CM W PERF (CODE STROKE)  Result Date: 01/28/2021 CLINICAL DATA:  Right-sided weakness EXAM: CT ANGIOGRAPHY HEAD AND NECK CT PERFUSION BRAIN TECHNIQUE: Multidetector CT imaging of the head and neck was performed using the standard protocol during bolus administration of intravenous contrast. Multiplanar CT image reconstructions and MIPs were obtained to evaluate the vascular anatomy. Carotid stenosis measurements (when applicable) are obtained utilizing NASCET criteria, using the distal internal carotid diameter as the denominator. Multiphase CT imaging of the brain was performed following IV bolus contrast injection. Subsequent parametric perfusion maps were calculated using RAPID software. CONTRAST:  80 mL Omnipaque 350 COMPARISON:  Report of CTA from 2015 only. Images are unavailable at the time of dictation. FINDINGS: CT HEAD Brain: There is no acute intracranial hemorrhage or mass effect. There is hypoattenuation with loss of gray-white differentiation involving left insula and left frontoparietal lobes (pre and postcentral gyri). There is likely a chronic small left frontal cortical infarct involving the precentral gyrus. Chronic right occipitotemporal infarct. There are age-indeterminate but probably chronic small vessel infarcts of basal ganglia and central white matter bilaterally. Additional patchy and confluent areas of low-attenuation in the supratentorial white matter nonspecific but may reflect moderate chronic microvascular ischemic changes. Vascular: No definite hyperdense vessel. There may be focal hyperdensity of distal left M2 branch within the sylvian fissure, which would correspond to CTA findings. Skull: Calvarium is unremarkable.  Sinuses/Orbits: No acute finding. Other: None. Review of the MIP images confirms the above findings ASPECTS [facility] Stroke Program Early CT Score) - Ganglionic level infarction (caudate, lentiform nuclei, internal capsule, insula, M1-M3 cortex): 5 - Supraganglionic infarction (M4-M6 cortex): 2 Total score (0-10 with 10 being normal): 7 CTA NECK Aortic arch: Great vessel origins are patent with mild calcified plaque. Right carotid system: Patent. Calcified plaque at the bifurcation and along the proximal internal carotid with less than 50% stenosis. Retropharyngeal course of the ICA. Left carotid system: Patent. Mixed plaque at the bifurcation and proximal internal carotid without stenosis. Retropharyngeal course of the ICA. Vertebral arteries: Patent and codominant.  No stenosis. Skeleton: Mild degenerative changes of the cervical spine. Other neck: Diffusely enlarged but homogeneous appearing thyroid. No ultrasound follow-up is recommended by current guidelines. Upper chest: Mild centrilobular emphysema. Review of the MIP images confirms the above findings CTA HEAD Anterior circulation: Intracranial internal carotid arteries are patent with calcified plaque along the cavernous and proximal supraclinoid portions. There is moderate to marked stenosis of the proximal left cavernous segment. Additional areas of moderate stenosis bilaterally. Left M1 MCA is patent. There is distal left M2 branch occlusion within the posterior sylvian fissure. Right middle cerebral artery is patent. Anterior cerebral arteries are patent. Anterior communicating artery is present. Posterior circulation: Intracranial vertebral arteries are patent. Basilar artery is patent. Major cerebellar artery origins are patent. Left posterior cerebral artery is patent. There is significantly decreased flow within the right PCA beginning at the P2 segment probably related to prior infarct. Venous sinuses: Patent as allowed by contrast bolus timing.  Review  of the MIP images confirms the above findings CT Brain Perfusion Findings: CBF (<30%) Volume: 11mL Perfusion (Tmax>6.0s) volume: 39mL Mismatch Volume: 28mL Infarction Location: Left MCA territory IMPRESSION: No acute intracranial hemorrhage. Acute left MCA territory infarction with ASPECT score of 7. Distal left M2 MCA branch occlusion. Perfusion imaging demonstrates core infarction of 11 mL congruent with noncontrast CT findings. There is a calculated penumbra of 28 mL. Additional chronic findings detailed above including infarcts and microvascular ischemic changes. Plaque without hemodynamically significant stenosis at the ICA origins. Calcified plaque along the intracranial internal carotid arteries. Suspect moderate to marked stenosis of the proximal left cavernous segment. Additional areas of moderate stenosis bilaterally. Significantly decreased flow within the right PCA beginning at the P2 segment probably related to prior infarct. Emphysema. Mild enlargement of the main pulmonary artery, which could indicate pulmonary arterial hypertension. Initial results were communicated to Dr. Amada Jupiter at 12:27 pm on 01/28/2021 by text page via the [facility] messaging system. Electronically Signed   By: Guadlupe Spanish M.D.   On: 01/28/2021 12:44     PHYSICAL EXAM  General exam Pleasant middle-aged African-American male, no acute distress HEENT-  Normocephalic, no lesions, without obvious abnormality.  Normal external eye and conjunctiva.   Cardiovascular- regular rhythm, on tele, tachycardic    Neurologic Exam: Alert, orients to name, dob and age and place. Still with expressive aphasia, able to repeat short sentences with severe dysarthric and difficult to understand.  Follows simple commands, comprehension seems intact.  No gaze palsy, tracking bilaterally, visual field full, right facial droop, tongue protrusion to the right.  Right upper extremity is 2/5, right lower extremity 3/5.  Left upper and lower  extremity 5/5.  Sensation decreased on the right.  Left finger-to-nose intact.  Gait not tested.   ASSESSMENT/PLAN Mr. Niam Nepomuceno is a 65 y.o. male with a past medical history significant for previous stroke, DVT on Xarelto, HTN, CAD/MI, HLD who presented to [facility] after being found by his ex-wife with right-sided weakness and aphasia. On arrival, imaging showed a distal left M2 MCA branch occlusion with a large area of penumbra and the CT perfusion is favorable for intervention and was taken for emergent thrombectomy. More recent MR this morning showed a large acute left MCA branch infarct with petechial hemorrhage.  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 Fib LDL 94  HgbA1c 5.9 Was on aspirin 81, resume aspirin 81 for stroke prevention.  No DAPT at this time given hemorrhagic transformation. 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 Pearlean Brownie 01/30/21 note)  Hypertension Stable Continue lisinopril, amlodipine BP goal less than 160 given hemorrhagic conversion Long-term BP goal normotensive  Hyperlipidemia LDL 94, goal < 70 Add Lipitor 40mg  daily Continue statin at discharge  Dysphagia  Passed swallow with modified diet Speech on board Avoid aspiration  Hospital day # [id number] Metzger[place], ARNP-C, ANVP-BC Pager: [phone]  Stroke Neurology 02/06/2021 2:17 PM  ATTENDING NOTE: I reviewed above note and agree with the assessment and plan. Pt was seen and examined.   No acute event overnight, neuro stable, continue current management.  Pending SNF  For detailed assessment and plan, please refer to above as I have made changes wherever appropriate.   Marvel Plan, MD PhD Stroke Neurology 02/06/2021 8:59 PM    To contact Stroke Continuity provider, please refer to [email or web address]. After hours, contact General Neurology

## 2021-02-06 NOTE — TOC Progression Note (Addendum)
Transition of Care [facility]) - Progression Note    Patient Details  Name: Kenneth Yu MRN: [id number] Date of Birth: 06/16/1956  Transition of Care [facility] CM/SW Contact  Erin Sons, [place] Phone Number: 02/06/2021, 9:18 AM  Clinical Narrative:     CSW called pt daughter to get new facility choice. Daughter chooses [facility]. CSW called [facility] liaison to inquire about possible admission today. She is in morning bed meeting, will call CSW back afterwards.   1143:  [facility] can accept pt tomorrow. They will change facility on pt's authorization.   Expected Discharge Plan: Skilled Nursing Facility Barriers to Discharge: Insurance Authorization  Expected Discharge Plan and Services Expected Discharge Plan: Skilled Nursing Facility   Discharge Planning Services: CM Consult Post Acute Care Choice: IP Rehab Living arrangements for the past 2 months: Apartment                                       Social Determinants of Health (SDOH) Interventions    Readmission Risk Interventions No flowsheet data found.

## 2021-02-06 NOTE — Plan of Care (Signed)
  Problem: Education: Goal: Knowledge of disease or condition will improve Outcome: Progressing Goal: Knowledge of secondary prevention will improve Outcome: Progressing Goal: Knowledge of patient specific risk factors addressed and post discharge goals established will improve Outcome: Progressing Goal: Individualized Educational Video(s) Outcome: Progressing   Problem: Coping: Goal: Will verbalize positive feelings about self Outcome: Progressing Goal: Will identify appropriate support needs Outcome: Progressing   Problem: Health Behavior/Discharge Planning: Goal: Ability to manage health-related needs will improve Outcome: Progressing   Problem: Self-Care: Goal: Ability to participate in self-care as condition permits will improve Outcome: Progressing Goal: Verbalization of feelings and concerns over difficulty with self-care will improve Outcome: Progressing Goal: Ability to communicate needs accurately will improve Outcome: Progressing   

## 2021-02-07 ENCOUNTER — Other Ambulatory Visit (INDEPENDENT_AMBULATORY_CARE_PROVIDER_SITE_OTHER): Payer: Medicare (Managed Care) | Admitting: Student

## 2021-02-07 ENCOUNTER — Other Ambulatory Visit (HOSPITAL_COMMUNITY): Payer: Self-pay

## 2021-02-07 DIAGNOSIS — I10 Essential (primary) hypertension: Secondary | ICD-10-CM | POA: Diagnosis not present

## 2021-02-07 DIAGNOSIS — R0789 Other chest pain: Secondary | ICD-10-CM

## 2021-02-07 DIAGNOSIS — R531 Weakness: Secondary | ICD-10-CM | POA: Diagnosis not present

## 2021-02-07 DIAGNOSIS — R000 Tachycardia, unspecified: Secondary | ICD-10-CM

## 2021-02-07 DIAGNOSIS — R4702 Dysphasia: Secondary | ICD-10-CM | POA: Diagnosis not present

## 2021-02-07 DIAGNOSIS — I639 Cerebral infarction, unspecified: Secondary | ICD-10-CM

## 2021-02-07 DIAGNOSIS — I6389 Other cerebral infarction: Secondary | ICD-10-CM

## 2021-02-07 LAB — SARS CORONAVIRUS 2 (TAT 6-24 HRS): SARS Coronavirus 2: NEGATIVE

## 2021-02-07 LAB — TROPONIN I (HIGH SENSITIVITY): Troponin I (High Sensitivity): 14 ng/L (ref <18)

## 2021-02-07 MED ORDER — AMLODIPINE BESYLATE 10 MG PO TABS
10.0000 mg | ORAL_TABLET | Freq: Every day | ORAL | 0 refills | Status: AC
  Filled 2021-02-07: qty 30, 30d supply, fill #0

## 2021-02-07 MED ORDER — ALUM & MAG HYDROXIDE-SIMETH 200-200-20 MG/5ML PO SUSP
15.0000 mL | Freq: Four times a day (QID) | ORAL | Status: DC | PRN
  Administered 2021-02-07: 15 mL via ORAL
  Filled 2021-02-07: qty 30

## 2021-02-07 MED ORDER — FAMOTIDINE 20 MG PO TABS
20.0000 mg | ORAL_TABLET | Freq: Two times a day (BID) | ORAL | 0 refills | Status: AC
  Filled 2021-02-07: qty 30, 15d supply, fill #0

## 2021-02-07 MED ORDER — ALUM & MAG HYDROXIDE-SIMETH 200-200-20 MG/5ML PO SUSP: 15.0000 mL | Freq: Four times a day (QID) | ORAL | 0 refills | Status: AC | PRN

## 2021-02-07 MED ORDER — ALBUTEROL SULFATE (2.5 MG/3ML) 0.083% IN NEBU
2.5000 mg | INHALATION_SOLUTION | RESPIRATORY_TRACT | 12 refills | Status: AC | PRN
  Filled 2021-02-07: qty 90, 5d supply, fill #0

## 2021-02-07 NOTE — Discharge Summary (Addendum)
Physician Discharge Summary  Patient ID: Kenneth Yu MRN: [id number] DOB/AGE: Dec 17, 1955 65 y.o.  Admit date: 01/28/2021 Discharge date: 02/07/2021  Admission Diagnoses:  Discharge Diagnoses:  Active Problems:   Stroke (cerebrum) (HCC)   Middle cerebral artery embolism, left   Discharged Condition: fair  Hospital Course: [name] is a 65 y.o. male with a past medical history significant for previous stroke, DVT on Xarelto, HTN, CAD/MI, HLD who presented to [facility] after being found by his ex-wife with right-sided weakness and aphasia. On arrival, imaging showed a distal left M2 MCA branch occlusion with a large area of penumbra and the CT perfusion is favorable for intervention and was taken for emergent thrombectomy. More recent MR this morning showed a large acute left MCA branch infarct with petechial hemorrhage. Today, he briefly c/o epigastric/CP. There was no changes on tele nor a 12 Lead EKG. Trop was neg and he reports relief after antacid given. He is stable and ready for rehab at SNF at this time.    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 Fib, but pt declined this at this time and has opted to have a 30D Event monitor to be placed. (Cardiology will arrange for this as out pt) LDL 94 HgbA1c 5.9 Was on aspirin 81, resume aspirin 81 for stroke prevention.  No DAPT at this time given  hemorrhagic transformation. Ongoing aggressive stroke risk factor management Therapy recommendations SNF Disposition  insurance approval for today   Episode of headache and no recollection Occurred on 02/02/2019 due to while working with speech therapist CT repeat slight increase of hemorrhagic conversion on the left MCA EEG no seizure Fioricet as needed Repeat CT 02/03/2021 stable hemorrhagic conversion, aspirin 81 resumed   History of stroke 09/2013 right temporal and occipital stroke, started on Xarelto at that time per chart (10/27/13 per Dr. 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 Pearlean Brownie 01/30/21 note)   Hypertension Stable Continue lisinopril, amlodipine BP goal less than 160 given hemorrhagic conversion Long-term BP goal normotensive   Hyperlipidemia LDL 94, goal < 70 Add Lipitor 40mg  daily Continue statin at discharge   Dysphagia Passed swallow with modified diet Speech on board Avoid aspiration  Consults: cardiology, NIR  Significant Diagnostic Studies: labs: , microbiology: , radiology: , cardiac graphics: Telemetry:  , ECG:  , and Echocardiogram:   , and angiography:   Treatments: outside of IV thrombolytic time window. Was taken emergently for thrombectomy of the LMCA M2 with TICI 3 revascularization by Dr .  Discharge Exam: Blood pressure 112/73, pulse 74, temperature 97.9 F (36.6 C), temperature source Oral, resp. rate 20, height 5\' 6"  (1.676 m), weight 91.7 kg, SpO2 100 %. General exam Pleasant middle-aged African-American male, no acute distress HEENT-  Normocephalic, no lesions, without  obvious abnormality.  Normal external eye and conjunctiva.   Cardiovascular- regular rhythm, on tele, tachycardic     Neurologic Exam: Alert, orients to name, dob and age and place. Still with expressive aphasia, able to repeat short  sentences with severe dysarthric and difficult to understand.  Follows simple commands, comprehension seems intact.  No gaze palsy, tracking bilaterally, visual field full, right facial droop, tongue protrusion to the right.  Right upper extremity is 2/5, right lower extremity 3/5.  Left upper and lower extremity 5/5.  Sensation decreased on the right.  Left finger-to-nose intact.  Gait not tested.  Disposition: Discharge disposition: 03-Skilled Nursing Facility        Allergies as of 02/07/2021   No Known Allergies      Medication List     STOP taking these medications    magnesium hydroxide 800 MG/5ML suspension Commonly known as: MILK OF MAGNESIA   rivaroxaban 20 MG Tabs tablet Commonly known as: XARELTO       TAKE these medications    acetaminophen 325 MG tablet Commonly known as: TYLENOL Take 650 mg by mouth every 4 (four) hours as needed.   albuterol (2.5 MG/3ML) 0.083% nebulizer solution Commonly known as: PROVENTIL Take 3 mLs (2.5 mg total) by nebulization every 4 (four) hours as needed for wheezing or shortness of breath.   alum & mag hydroxide-simeth 200-200-20 MG/5ML suspension Commonly known as: MAALOX/MYLANTA Take 15 mLs by mouth every 6 (six) hours as needed for indigestion or heartburn.   amLODipine 10 MG tablet Commonly known as: NORVASC Take 1 tablet (10 mg total) by mouth daily. Start taking on: February 08, 2021   aspirin 81 MG tablet Take 81 mg by mouth daily.   atorvastatin 80 MG tablet Commonly known as: LIPITOR Take 80 mg by mouth daily.   bisacodyl 10 MG suppository Commonly known as: DULCOLAX Place 10 mg rectally daily as needed for moderate constipation.   docusate sodium 100 MG capsule Commonly known as: COLACE Take 100 mg by mouth 2 (two) times daily.   famotidine 20 MG tablet Commonly known as: PEPCID Take 1 tablet (20 mg total) by mouth 2 (two) times daily.   lisinopril 10 MG tablet Commonly known as: ZESTRIL Take 10 mg by  mouth daily.        Contact information for follow-up providers     Graciella Freer, PA-C Follow up.   Specialty: Physician Assistant Why: on 04/12/2021 at 1200 pm for post hospital follow up Contact information: [address] [place] [address] [phone]         [facility] Follow up.   Specialty: Neurology Contact information: [address] [place] [address] [phone]             Contact information for after-discharge care     Destination     [facility] .   Service: Skilled Nursing Contact information: [address] [place]. [place] [address] [phone]                   DC time today  Signed: Desiree Metzger-Cihelka 02/07/2021, 1:14 PM  ATTENDING NOTE: I reviewed above note and agree with the assessment and plan. Pt was seen and examined.   Patient neuro stable, no acute event overnight, still has expressive aphasia and right hemiparesis.  Ready for CIR discharge.  We will follow-up with [facility] stroke clinic in 4 weeks.  For detailed assessment and  plan, please refer to above as I have made changes wherever appropriate.   Marvel Plan, MD PhD Stroke Neurology 02/08/2021 6:43 PM

## 2021-02-07 NOTE — NC FL2 (Signed)
[place] LEVEL OF CARE SCREENING TOOL     IDENTIFICATION  Patient Name: Kenneth Yu Birthdate: 03-18-1956 Sex: male Admission Date (Current Location): 01/28/2021  [facility] and [place] Number:  Herbalist and Address:  [facility] [place] [address] [place], [place] [address]      Provider Number: [id number]  Attending Physician Name and Address:  Stroke, Md, MD  Relative Name and Phone Number:  Azucena Kuba, daughter, [phone]    Current Level of Care: Hospital Recommended Level of Care: [place] Prior Approval Number:    Date Approved/Denied:   PASRR Number: [id number]  Discharge Plan: SNF    Current Diagnoses: Patient Active Problem List   Diagnosis Date Noted   Stroke (cerebrum) ([place]) 01/28/2021   Middle cerebral artery embolism, left 01/28/2021   CAD (coronary artery disease) 10/28/2013   Acute CVA (cerebrovascular accident) ([place]) 10/27/2013   DVT (deep venous thrombosis) (HCC)    Myocardial infarction ([place])    Hypertension    Hyperlipidemia     Orientation RESPIRATION BLADDER Height & Weight     Self, Time, Situation, Place  Normal Incontinent, External catheter Weight: 202 lb 2.6 oz (91.7 kg) Height:  _0  (167.6 cm)  BEHAVIORAL SYMPTOMS/MOOD NEUROLOGICAL BOWEL NUTRITION STATUS      Continent Diet (see d/c summary)  AMBULATORY STATUS COMMUNICATION OF NEEDS Skin   Extensive Assist Verbally Normal                       Personal Care Assistance Level of Assistance  Bathing, Feeding, Dressing Bathing Assistance: Maximum assistance Feeding assistance: Limited assistance Dressing Assistance: Maximum assistance     Functional Limitations Info             SPECIAL CARE FACTORS FREQUENCY  PT (By licensed PT), OT (By licensed OT)     PT Frequency: 5x/week OT Frequency: 5x/week            Contractures Contractures Info: Not present    Additional Factors Info  Code Status,  Allergies Code Status Info: Full Allergies Info: NKA           Current Medications (02/07/2021):  This is the current hospital active medication list Current Facility-Administered Medications  Medication Dose Route Frequency Provider Last Rate Last Admin   0.9 %  sodium chloride infusion   Intravenous PRN Jacky Kindle, MD   Stopped at 01/30/21 0501   acetaminophen (TYLENOL) tablet 650 mg  650 mg Oral Q4H PRN Luanne Bras, MD   650 mg at 02/06/21 0836   Or   acetaminophen (TYLENOL) 160 MG/5ML solution 650 mg  650 mg Per Tube Q4H PRN Luanne Bras, MD   650 mg at 01/28/21 1859   Or   acetaminophen (TYLENOL) suppository 650 mg  650 mg Rectal Q4H PRN Deveshwar, Willaim Rayas, MD   650 mg at 02/01/21 0843   albuterol (PROVENTIL) (2.5 MG/3ML) 0.083% nebulizer solution 2.5 mg  2.5 mg Nebulization Q4H PRN Greta Doom, MD       amLODipine (NORVASC) tablet 10 mg  10 mg Oral Daily Rosalin Hawking, MD   10 mg at 02/06/21 0836   aspirin EC tablet 81 mg  81 mg Oral Daily Rosalin Hawking, MD   81 mg at 02/06/21 0836   atorvastatin (LIPITOR) tablet 40 mg  40 mg Oral Daily Rosalin Hawking, MD   40 mg at 02/06/21 0836   butalbital-acetaminophen-caffeine (FIORICET) 50-325-40 MG per tablet 1 tablet  1 tablet Oral Q12H PRN Rosalin Hawking, MD   1 tablet at 02/04/21 1234   chlorhexidine gluconate (MEDLINE KIT) (PERIDEX) 0.12 % solution 15 mL  15 mL Mouth Rinse BID Jacky Kindle, MD   15 mL at 02/07/21 0759   famotidine (PEPCID) tablet 20 mg  20 mg Oral BID Garvin Fila, MD   20 mg at 02/06/21 2124   lisinopril (ZESTRIL) tablet 20 mg  20 mg Oral BID Rosalin Hawking, MD   20 mg at 02/06/21 2124   senna-docusate (Senokot-S) tablet 1 tablet  1 tablet Oral BID Rosalin Hawking, MD   1 tablet at 02/06/21 2124     Discharge Medications: Please see discharge summary for a list of discharge medications.  Relevant Imaging Results:  Relevant Lab Results:   Additional Information SSN: [place],  [place]

## 2021-02-07 NOTE — Progress Notes (Addendum)
STROKE TEAM PROGRESS NOTE   SUBJECTIVE (INTERVAL HISTORY) No family at bedside. Today pt c/o epigastric and CP. I reviewed the telemetry, no change noted. We ordered stat Troponin and EKG 12 lead- no change from previous and neg Trop. SNF dc was arranged, just to end up canceling d/t insurance certificate. Neuro exam stable.   OBJECTIVE Vitals:   02/07/21 1038 02/07/21 1040 02/07/21 1050 02/07/21 1537  BP: 123/74 118/66 112/73 118/80  Pulse: 90 86 74 76  Resp: 20 20 20 18   Temp:    97.8 F (36.6 C)  TempSrc:    Oral  SpO2: 97%  100% 96%  Weight:      Height:        CBC:  Recent Labs  Lab 02/04/21 0408 02/05/21 0334  WBC 7.2 7.4  HGB 15.2 15.2  HCT 46.4 45.6  MCV 87.9 87.2  PLT 181 191     Basic Metabolic Panel:  Recent Labs  Lab 02/04/21 0408 02/05/21 0334  NA 142 142  K 4.6 4.2  CL 106 106  CO2 27 27  GLUCOSE 90 98  BUN 16 18  CREATININE 1.13 1.20  CALCIUM 9.4 9.3     Lipid Panel:  No results for input(s): CHOL, TRIG, HDL, CHOLHDL, VLDL, LDLCALC in the last 168 hours.  HgbA1c:  Lab Results  Component Value Date   HGBA1C 5.9 (H) 01/29/2021    IMAGING CT HEAD WO CONTRAST  Result Date: 02/03/2021 CLINICAL DATA:  Stroke follow-up EXAM: CT HEAD WITHOUT CONTRAST TECHNIQUE: Contiguous axial images were obtained from the base of the skull through the vertex without intravenous contrast. COMPARISON:  Two days ago FINDINGS: Brain: Left MCA branch infarct in the lateral and posterior frontal lobe with petechial hemorrhage. No progressive finding. Background of advanced chronic small vessel ischemia with confluent gliosis and chronic lacunar infarct at the right basal ganglia. Remote right occipital cortex infarct. Remote right cerebellar infarct. No hydrocephalus or masslike finding Vascular: Stable Skull: Normal. Negative for fracture or focal lesion. Sinuses/Orbits: Bilateral cataract resection IMPRESSION: No adverse evolution of the left MCA branch infarct with  petechial hemorrhage. Electronically Signed   By: Marnee Spring M.D.   On: 02/03/2021 10:32   CT HEAD WO CONTRAST  Result Date: 02/01/2021 CLINICAL DATA:  Left frontal headache EXAM: CT HEAD WITHOUT CONTRAST TECHNIQUE: Contiguous axial images were obtained from the base of the skull through the vertex without intravenous contrast. COMPARISON:  01/30/2021 FINDINGS: Brain: Evolving left MCA territory infarction is again identified. Gyriform hyperdensity is again noted reflecting petechial hemorrhage. This does not appear substantially changed from the prior study. Additional smaller acute infarcts are better seen on the prior MRI. Chronic infarcts including involvement of right occipital lobe, right medial temporal lobe, right caudate, and right cerebellum are again identified. Stable findings of probable chronic microvascular ischemic changes. No significant mass effect.  No hydrocephalus. Vascular: No new finding. Skull: Calvarium is unremarkable. Sinuses/Orbits: No acute finding. Other: None. IMPRESSION: Evolving recent left MCA territory infarction with similar petechial hemorrhage. No significant mass effect. Electronically Signed   By: Guadlupe Spanish M.D.   On: 02/01/2021 09:32   CT Head Wo Contrast  Result Date: 01/30/2021 CLINICAL DATA:  Stroke follow-up. EXAM: CT HEAD WITHOUT CONTRAST TECHNIQUE: Contiguous axial images were obtained from the base of the skull through the vertex without intravenous contrast. COMPARISON:  Brain MRI 01/29/2021. Noncontrast head CT and CT angiogram head/neck 01/28/2021. FINDINGS: Brain: Mild generalized parenchymal atrophy. Redemonstrated evolving acute cortically based left  MCA territory infarct within the left frontoparietal lobes and left insula. Redemonstrated mild corresponding petechial hemorrhage. Small acute infarcts were better appreciated on the prior MRI. Chronic cortically based right occipital temporal lobe infarct. Chronic lacunar infarcts within the  bilateral corona radiata and deep gray nuclei. Stable background moderate/advanced chronic small vessel ischemic disease within the cerebral white matter. Redemonstrated small chronic infarct within the right cerebellar hemisphere. No extra-axial fluid collection. No evidence of an intracranial mass. No midline shift. Vascular: Atherosclerotic calcifications Skull: Normal. Negative for fracture or focal lesion. Sinuses/Orbits: Visualized orbits show no acute finding. Trace mucosal thickening within the right ethmoid air cells. Fluid levels and background mild mucosal thickening within the right greater than left sphenoid sinuses. Trace left maxillary sinus mucosal thickening. IMPRESSION: Evolving moderate to large acute left MCA territory infarct, not appreciably changed in extent from the brain MRI of 01/29/2021. As before, there is mild petechial hemorrhage within the infarction territory. Associated small acute infarcts within the left caudate nucleus were better appreciated on the prior MRI. Otherwise stable non-contrast CT appearance of the brain. 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 Background chronic small vessel ischemic disease with multiple chronic lacunar infarcts. Small chronic infarct within the right cerebellar hemisphere. Mild generalized cerebral atrophy. Paranasal sinus disease, as described. Electronically Signed   By: Jackey Loge DO   On: 01/30/2021 08:30   MR BRAIN WO CONTRAST  Result Date: 01/29/2021 CLINICAL DATA:  Right-sided weakness.  History of stroke EXAM: MRI HEAD WITHOUT CONTRAST TECHNIQUE: Multiplanar, multiecho pulse sequences of the brain and surrounding structures were obtained without intravenous contrast. COMPARISON:  CT and CTA from yesterday FINDINGS: Brain: Large area of cortically based infarction in the lateral left frontal lobe, MCA branch distribution. Minimal patchy left caudate acute infarcts. Petechial hemorrhage is present. Remote cortically based  infarct affecting the right occipital lobe. Chronic lacunar infarcts in the bilateral deep gray nuclei. Chronic small right cerebellar infarct. Confluent chronic small vessel ischemic gliosis in the deep white matter. Brain volume is normal. Chronic micro hemorrhages which are mainly peripheral but in areas that were likely affected by prior infarction. No hydrocephalus, collection, or masslike finding. Vascular: Preserved flow voids. Skull and upper cervical spine: Normal marrow signal Sinuses/Orbits: Nasopharyngeal and sinus opacification in the setting of intubation. Bilateral cataract resection. IMPRESSION: 1. Large acute left MCA branch infarct with petechial hemorrhage. Minimal involvement in the left caudate head. 2. Chronic small vessel disease with chronic lacunar infarcts. Prior right PCA territory infarct. Electronically Signed   By: Marnee Spring M.D.   On: 01/29/2021 05:29   IR CT Head Ltd  Result Date: 01/30/2021 INDICATION: New onset aphasia with right-sided weakness. Occluded superior division of the left middle cerebral artery proximally on CT angiogram of the head and neck. EXAM: 1. EMERGENT LARGE VESSEL OCCLUSION THROMBOLYSIS (anterior CIRCULATION) COMPARISON:  CT angiogram of the head and neck of January 28, 2021. MEDICATIONS: Ancef 2 g IV antibiotic was administered within 1 hour of the procedure. ANESTHESIA/SEDATION: General anesthesia. CONTRAST:  Omnipaque 300 55 mL. FLUOROSCOPY TIME:  Fluoroscopy Time: 20 minutes 18 seconds (1160 mGy). COMPLICATIONS: None immediate. TECHNIQUE: Emergent consent was signed to physicians. No family members or next of kin were available on phone or physically. Patient was unable to provide consent due to his medical condition. The patient was then put under general anesthesia by the Department of Anesthesiology at [facility]. The right groin was prepped and draped in the usual sterile fashion. Thereafter using modified Seldinger  technique, transfemoral  access into the right common femoral artery was obtained without difficulty. Over a 0.035 inch guidewire an 8 [place] Pinnacle 25 cm sheath was inserted. Through this, and also over a 0.035 inch guidewire a 5 [place] JB 1 catheter was advanced to the aortic arch region and selectively positioned in the left common carotid artery. FINDINGS: The left common carotid arteriogram demonstrates the left external carotid artery and its major branches to be widely patent. The left internal carotid artery at the bulb has mild narrowing proximally. More distally the vessel is seen to opacify to the cranial skull base. The petrous, cavernous and supraclinoid segments are patent with focal area of mild stenosis in the caval segment. The left anterior cerebral artery opacifies normally into the capillary and venous phases. The left middle cerebral artery M1 segment is widely patent. The inferior division is normal into the capillary and venous phases. The superior division demonstrates a prominent side branch occlusion proximally. PROCEDURE: The diagnostic JB 1 catheter in the left common carotid artery was then exchanged over a 0.035 inch 300 cm Rosen exchange guidewire for an 087 balloon guide catheter which had been prepped with 50% contrast and 50% heparinized saline infusion. Guidewire was removed. Good aspiration obtained from the hub of the balloon guide catheter. Gentle control arteriogram performed through this demonstrated no evidence of spasms, dissections or of intraluminal filling defects. Over a 0.035 Roadrunner guidewire, this was then advanced to the distal 1/3 of the left internal carotid artery. The guidewire was removed. Again good aspiration obtained from the hub of the balloon guide catheter. Control arteriogram performed showed no evidence of spasms, dissections or of intraluminal filling defects. Over a 0.014 inch standard Synchro micro guidewire with a J-tip configuration the combination of an 055 Zoom  aspiration and 136 cm catheter inside of which was a 160 cm 021 Trevo ProVue microcatheter was advanced to the supraclinoid left ICA. The micro guidewire was then gently maneuvered with a torque device into the occluded segment of the left middle cerebral artery superior division into the distal M3 region followed by the microcatheter. The guidewire was removed. Good aspiration obtained from the hub of the microcatheter. Gentle control arteriogram performed through the microcatheter demonstrated safe position of the tip of the microcatheter with antegrade flow of contrast. This was then connected to continuous heparin saline infusion. A 4 mm x 40 mm Solitaire X retrieval device was then advanced to the distal end of the microcatheter. O ring on the delivery microcatheter was loosened. With slight forward gentle traction with the right hand on the delivery micro guidewire with the left hand the delivery microcatheter was retrieved deploying the retrieval device the proximal end of which was just proximal to the occluded inferior division. 055 Zoom aspiration catheter was advanced and engaged at the origin of the occluded branch. With proximal flow arrest in the left internal carotid artery, and with constant aspiration being applied the hub of 055 aspiration catheter with the Penumbra aspiration device, and a 20 mL syringe at the hub of the balloon guide catheter in the left internal carotid artery for approximately 2-1/2 minutes, the combination of the retrieval device, the microcatheter, and the Zoom aspiration catheter was retrieved and removed. Flow arrest was reversed in the left internal carotid artery. A control arteriogram performed through the balloon guide in the proximal left internal carotid artery demonstrated moderate spasm in the mid cervical left ICA. A 3 mm x 1 mm clot was noted entangled in  the retrieval device. Wide patency was now noted of the previously occluded superior division inferior branch  achieving a TICI 3 revascularization of the left middle cerebral artery distribution. The left anterior cerebral artery remained unchanged widely patent. Spasm noted at the distal end of the left internal carotid artery gradually resolved after 4 aliquots of 25 mcg of nitroglycerin intra-arterially. The balloon guide was then retrieved into the left common carotid artery. A final control arteriogram performed through the balloon guide catheter demonstrated near complete resolution of the previous noted spasm in the left internal carotid artery distally. The left MCA and ACA distributions continued to show complete revascularization. The balloon guide was then removed. The 8 French sheath was removed with hemostasis achieved with manual compression for approximately 20 minutes. Distal pulses remained Dopplerable in both feet unchanged. A CT of the brain demonstrated no evidence of intracranial hemorrhages. Patient was left intubated waiting the results of the COVID-19 test. IMPRESSION: Status post endovascular complete revascularization of occluded prominent side branch of the superior division of the left middle cerebral artery with 1 pass with a 4 mm x 40 mm Solitaire X retrieval device and contact aspiration achieving a TICI 3 revascularization. PLAN: Follow-up as per referring MD. Electronically Signed   By: Julieanne Cotton M.D.   On: 01/29/2021 13:05   DG CHEST PORT 1 VIEW  Result Date: 01/29/2021 CLINICAL DATA:  Endotracheal tube placement EXAM: PORTABLE CHEST 1 VIEW COMPARISON:  08/07/2018 FINDINGS: Endotracheal tube placed with tip measuring 3.6 cm above the carina. Enteric tube placed with tip not visualized off the field of view but below the left hemidiaphragm. Cardiac enlargement. Small left pleural effusion. Atelectasis or infiltration in both lung bases. No pneumothorax. Calcification of the aorta. IMPRESSION: Appliances appear in satisfactory position. Probable small left pleural effusion with  atelectasis or infiltration in both lung bases. Electronically Signed   By: Burman Nieves M.D.   On: 01/29/2021 02:49   DG Abd Portable 1V  Result Date: 01/28/2021 CLINICAL DATA:  OG tube placement. EXAM: PORTABLE ABDOMEN - 1 VIEW COMPARISON:  None. FINDINGS: Tip and side port of the enteric tube below the diaphragm in the stomach. Air throughout nondilated small and large bowel in the upper abdomen. IMPRESSION: Tip and side port of the enteric tube below the diaphragm in the stomach. Electronically Signed   By: Narda Rutherford M.D.   On: 01/28/2021 18:02   EEG adult  Result Date: 02/02/2021 Charlsie Quest, MD     02/02/2021  8:41 AM Patient Name: Kenneth Yu MRN: [id number] Epilepsy Attending: Charlsie Quest Referring Physician/Provider: Dr Marvel Plan Date: 02/01/2021 Duration: 29.75mins Patient history: Per RN and speech therapies, patient this morning had acute onset left forehead headache, CT repeat showed a slight increase of hemorrhagic conversion on the left.  However, patient later has no recollection of the headache and CT repeat.  EEG to rule out a seizure episode.  Level of alertness: Awake, asleep AEDs during EEG study: None Technical aspects: This EEG study was done with scalp electrodes positioned according to the 10-20 International system of electrode placement. Electrical activity was acquired at a sampling rate of 500Hz  and reviewed with a high frequency filter of 70Hz  and a low frequency filter of 1Hz . EEG data were recorded continuously and digitally stored. Description: The posterior dominant rhythm consists of 9 Hz activity of moderate voltage (25-35 uV) seen predominantly in posterior head regions, symmetric and reactive to eye opening and eye closing. Sleep was characterized by  vertex waves, sleep spindles (12 to 14 Hz), maximal frontocentral region. EEG showed continuous left frontotemporal 3 to 6 Hz theta-delta slowing. Hyperventilation and photic stimulation were not performed.    ABNORMALITY - Continuous slow,  left frontotemporal region IMPRESSION: This study is suggestive of cortical dysfunction arising from left frontotemporal region likely secondary to underlying structural abnormality/stroke. No seizures or epileptiform discharges were seen throughout the recording. Charlsie Quest   ECHOCARDIOGRAM COMPLETE  Result Date: 01/29/2021    ECHOCARDIOGRAM REPORT   Patient Name:   ABEER IVERSEN Date of Exam: 01/29/2021 Medical Rec #:  [id number]     Height:       66.0 in Accession #:    [id number]    Weight:       202.2 lb Date of Birth:  July 19, 1956    BSA:          2.009 m Patient Age:    64 years      BP:           143/82 mmHg Patient Gender: M             HR:           98 bpm. Exam Location:  Inpatient Procedure: 2D Echo, Cardiac Doppler, Color Doppler and Intracardiac            Opacification Agent Indications:    Stroke  History:        Patient has no prior history of Echocardiogram examinations.                 Arrythmias:Atrial Fibrillation; Risk Factors:Former Smoker. H/O                 CVA.  Sonographer:    Ross Ludwig RDCS (AE) Referring Phys: [phone] MCNEILL P [facility]  Sonographer Comments: Suboptimal apical window. IMPRESSIONS  1. Left ventricular ejection fraction, by estimation, is 50 to 55%. The left ventricle has low normal function. The left ventricle demonstrates regional wall motion abnormalities (lateral wall hypokinesis). There is mild concentric left ventricular hypertrophy. Left ventricular diastolic parameters are consistent with Grade II diastolic dysfunction (pseudonormalization).  2. Right ventricular systolic function is normal. The right ventricular size is mildly enlarged. There is mildly elevated pulmonary artery systolic pressure. The estimated right ventricular systolic pressure is 40.5 mmHg.  3. Left atrial size was mildly dilated.  4. Right atrial size was severely dilated.  5. The mitral valve is grossly normal. Mild mitral valve regurgitation. The mean  mitral valve gradient is 3.0 mmHg with average heart rate of 89 bpm.  6. The aortic valve is tricuspid. There is mild calcification of the aortic valve. There is mild thickening of the aortic valve. Aortic valve regurgitation is not visualized. Mild aortic valve sclerosis is present, with no evidence of aortic valve stenosis.  7. The inferior vena cava is normal in size with <50% respiratory variability, suggesting right atrial pressure of 8 mmHg. FINDINGS  Left Ventricle: Left ventricular ejection fraction, by estimation, is 50 to 55%. The left ventricle has low normal function. The left ventricle demonstrates regional wall motion abnormalities. Definity contrast agent was given IV to delineate the left ventricular endocardial borders. The left ventricular internal cavity size was normal in size. There is mild concentric left ventricular hypertrophy. Left ventricular diastolic parameters are consistent with Grade II diastolic dysfunction (pseudonormalization).  LV Wall Scoring: The mid and distal lateral wall, posterior wall, mid anterolateral segment, and apical anterior segment are hypokinetic. The basal anterolateral segment is  normal. Right Ventricle: The right ventricular size is mildly enlarged. No increase in right ventricular wall thickness. Right ventricular systolic function is normal. There is mildly elevated pulmonary artery systolic pressure. The tricuspid regurgitant velocity is 2.85 m/s, and with an assumed right atrial pressure of 8 mmHg, the estimated right ventricular systolic pressure is 40.5 mmHg. Left Atrium: Left atrial size was mildly dilated. Right Atrium: Right atrial size was severely dilated. Pericardium: There is no evidence of pericardial effusion. Mitral Valve: The mitral valve is grossly normal. Mild mitral valve regurgitation. MV peak gradient, 6.8 mmHg. The mean mitral valve gradient is 3.0 mmHg with average heart rate of 89 bpm. Tricuspid Valve: The tricuspid valve is normal in  structure. Tricuspid valve regurgitation is mild . No evidence of tricuspid stenosis. Aortic Valve: The aortic valve is tricuspid. There is mild calcification of the aortic valve. There is mild thickening of the aortic valve. Aortic valve regurgitation is not visualized. Mild aortic valve sclerosis is present, with no evidence of aortic valve stenosis. Aortic valve mean gradient measures 5.0 mmHg. Aortic valve peak gradient measures 10.1 mmHg. Aortic valve area, by VTI measures 1.59 cm. Pulmonic Valve: The pulmonic valve was normal in structure. Pulmonic valve regurgitation is not visualized. No evidence of pulmonic stenosis. Aorta: The aortic root and ascending aorta are structurally normal, with no evidence of dilitation. Venous: The inferior vena cava is normal in size with less than 50% respiratory variability, suggesting right atrial pressure of 8 mmHg. IAS/Shunts: The atrial septum is grossly normal.  LEFT VENTRICLE PLAX 2D LVIDd:         5.10 cm  Diastology LVIDs:         3.60 cm  LV e' medial:    7.40 cm/s LV PW:         1.60 cm  LV E/e' medial:  15.1 LV IVS:        1.80 cm  LV e' lateral:   7.78 cm/s LVOT diam:     1.90 cm  LV E/e' lateral: 14.4 LV SV:         44 LV SV Index:   22 LVOT Area:     2.84 cm  RIGHT VENTRICLE             IVC RV Basal diam:  4.30 cm     IVC diam: 2.30 cm RV S prime:     12.30 cm/s TAPSE (M-mode): 1.4 cm LEFT ATRIUM             Index       RIGHT ATRIUM           Index LA diam:        3.60 cm 1.79 cm/m  RA Area:     36.50 cm LA Vol (A2C):   73.0 ml 36.34 ml/m RA Volume:   156.00 ml 77.65 ml/m LA Vol (A4C):   73.0 ml 36.34 ml/m LA Biplane Vol: 74.3 ml 36.98 ml/m  AORTIC VALVE AV Area (Vmax):    1.66 cm AV Area (Vmean):   1.40 cm AV Area (VTI):     1.59 cm AV Vmax:           159.00 cm/s AV Vmean:          108.000 cm/s AV VTI:            0.275 m AV Peak Grad:      10.1 mmHg AV Mean Grad:      5.0 mmHg LVOT Vmax:  93.20 cm/s LVOT Vmean:        53.200 cm/s LVOT VTI:           0.154 m LVOT/AV VTI ratio: 0.56  AORTA Ao Root diam: 3.10 cm Ao Asc diam:  3.60 cm MITRAL VALVE                TRICUSPID VALVE MV Area (PHT): 4.41 cm     TR Peak grad:   32.5 mmHg MV Area VTI:   1.85 cm     TR Vmax:        285.00 cm/s MV Peak grad:  6.8 mmHg MV Mean grad:  3.0 mmHg     SHUNTS MV Vmax:       1.30 m/s     Systemic VTI:  0.15 m MV Vmean:      75.2 cm/s    Systemic Diam: 1.90 cm MV Decel Time: 172 msec MV E velocity: 112.00 cm/s MV A velocity: 121.00 cm/s MV E/A ratio:  0.93 Riley Lam MD Electronically signed by Riley Lam MD Signature Date/Time: 01/29/2021/12:28:37 PM    Final    IR PERCUTANEOUS ART THROMBECTOMY/INFUSION INTRACRANIAL INC DIAG ANGIO  Result Date: 01/30/2021 INDICATION: New onset aphasia with right-sided weakness. Occluded superior division of the left middle cerebral artery proximally on CT angiogram of the head and neck. EXAM: 1. EMERGENT LARGE VESSEL OCCLUSION THROMBOLYSIS (anterior CIRCULATION) COMPARISON:  CT angiogram of the head and neck of January 28, 2021. MEDICATIONS: Ancef 2 g IV antibiotic was administered within 1 hour of the procedure. ANESTHESIA/SEDATION: General anesthesia. CONTRAST:  Omnipaque 300 55 mL. FLUOROSCOPY TIME:  Fluoroscopy Time: 20 minutes 18 seconds (1160 mGy). COMPLICATIONS: None immediate. TECHNIQUE: Emergent consent was signed to physicians. No family members or next of kin were available on phone or physically. Patient was unable to provide consent due to his medical condition. The patient was then put under general anesthesia by the Department of Anesthesiology at [facility]. The right groin was prepped and draped in the usual sterile fashion. Thereafter using modified Seldinger technique, transfemoral access into the right common femoral artery was obtained without difficulty. Over a 0.035 inch guidewire an 8 [place] Pinnacle 25 cm sheath was inserted. Through this, and also over a 0.035 inch guidewire a 5 [place] JB 1  catheter was advanced to the aortic arch region and selectively positioned in the left common carotid artery. FINDINGS: The left common carotid arteriogram demonstrates the left external carotid artery and its major branches to be widely patent. The left internal carotid artery at the bulb has mild narrowing proximally. More distally the vessel is seen to opacify to the cranial skull base. The petrous, cavernous and supraclinoid segments are patent with focal area of mild stenosis in the caval segment. The left anterior cerebral artery opacifies normally into the capillary and venous phases. The left middle cerebral artery M1 segment is widely patent. The inferior division is normal into the capillary and venous phases. The superior division demonstrates a prominent side branch occlusion proximally. PROCEDURE: The diagnostic JB 1 catheter in the left common carotid artery was then exchanged over a 0.035 inch 300 cm Rosen exchange guidewire for an 087 balloon guide catheter which had been prepped with 50% contrast and 50% heparinized saline infusion. Guidewire was removed. Good aspiration obtained from the hub of the balloon guide catheter. Gentle control arteriogram performed through this demonstrated no evidence of spasms, dissections or of intraluminal filling defects. Over a 0.035 Roadrunner guidewire, this was then  advanced to the distal 1/3 of the left internal carotid artery. The guidewire was removed. Again good aspiration obtained from the hub of the balloon guide catheter. Control arteriogram performed showed no evidence of spasms, dissections or of intraluminal filling defects. Over a 0.014 inch standard Synchro micro guidewire with a J-tip configuration the combination of an 055 Zoom aspiration and 136 cm catheter inside of which was a 160 cm 021 Trevo ProVue microcatheter was advanced to the supraclinoid left ICA. The micro guidewire was then gently maneuvered with a torque device into the occluded  segment of the left middle cerebral artery superior division into the distal M3 region followed by the microcatheter. The guidewire was removed. Good aspiration obtained from the hub of the microcatheter. Gentle control arteriogram performed through the microcatheter demonstrated safe position of the tip of the microcatheter with antegrade flow of contrast. This was then connected to continuous heparin saline infusion. A 4 mm x 40 mm Solitaire X retrieval device was then advanced to the distal end of the microcatheter. O ring on the delivery microcatheter was loosened. With slight forward gentle traction with the right hand on the delivery micro guidewire with the left hand the delivery microcatheter was retrieved deploying the retrieval device the proximal end of which was just proximal to the occluded inferior division. 055 Zoom aspiration catheter was advanced and engaged at the origin of the occluded branch. With proximal flow arrest in the left internal carotid artery, and with constant aspiration being applied the hub of 055 aspiration catheter with the Penumbra aspiration device, and a 20 mL syringe at the hub of the balloon guide catheter in the left internal carotid artery for approximately 2-1/2 minutes, the combination of the retrieval device, the microcatheter, and the Zoom aspiration catheter was retrieved and removed. Flow arrest was reversed in the left internal carotid artery. A control arteriogram performed through the balloon guide in the proximal left internal carotid artery demonstrated moderate spasm in the mid cervical left ICA. A 3 mm x 1 mm clot was noted entangled in the retrieval device. Wide patency was now noted of the previously occluded superior division inferior branch achieving a TICI 3 revascularization of the left middle cerebral artery distribution. The left anterior cerebral artery remained unchanged widely patent. Spasm noted at the distal end of the left internal carotid artery  gradually resolved after 4 aliquots of 25 mcg of nitroglycerin intra-arterially. The balloon guide was then retrieved into the left common carotid artery. A final control arteriogram performed through the balloon guide catheter demonstrated near complete resolution of the previous noted spasm in the left internal carotid artery distally. The left MCA and ACA distributions continued to show complete revascularization. The balloon guide was then removed. The 8 French sheath was removed with hemostasis achieved with manual compression for approximately 20 minutes. Distal pulses remained Dopplerable in both feet unchanged. A CT of the brain demonstrated no evidence of intracranial hemorrhages. Patient was left intubated waiting the results of the COVID-19 test. IMPRESSION: Status post endovascular complete revascularization of occluded prominent side branch of the superior division of the left middle cerebral artery with 1 pass with a 4 mm x 40 mm Solitaire X retrieval device and contact aspiration achieving a TICI 3 revascularization. PLAN: Follow-up as per referring MD. Electronically Signed   By: Julieanne Cotton M.D.   On: 01/29/2021 13:05   CT HEAD CODE STROKE WO CONTRAST  Result Date: 01/28/2021 CLINICAL DATA:  Right-sided weakness EXAM: CT ANGIOGRAPHY HEAD AND  NECK CT PERFUSION BRAIN TECHNIQUE: Multidetector CT imaging of the head and neck was performed using the standard protocol during bolus administration of intravenous contrast. Multiplanar CT image reconstructions and MIPs were obtained to evaluate the vascular anatomy. Carotid stenosis measurements (when applicable) are obtained utilizing NASCET criteria, using the distal internal carotid diameter as the denominator. Multiphase CT imaging of the brain was performed following IV bolus contrast injection. Subsequent parametric perfusion maps were calculated using RAPID software. CONTRAST:  80 mL Omnipaque 350 COMPARISON:  Report of CTA from 2015 only.  Images are unavailable at the time of dictation. FINDINGS: CT HEAD Brain: There is no acute intracranial hemorrhage or mass effect. There is hypoattenuation with loss of gray-white differentiation involving left insula and left frontoparietal lobes (pre and postcentral gyri). There is likely a chronic small left frontal cortical infarct involving the precentral gyrus. Chronic right occipitotemporal infarct. There are age-indeterminate but probably chronic small vessel infarcts of basal ganglia and central white matter bilaterally. Additional patchy and confluent areas of low-attenuation in the supratentorial white matter nonspecific but may reflect moderate chronic microvascular ischemic changes. Vascular: No definite hyperdense vessel. There may be focal hyperdensity of distal left M2 branch within the sylvian fissure, which would correspond to CTA findings. Skull: Calvarium is unremarkable. Sinuses/Orbits: No acute finding. Other: None. Review of the MIP images confirms the above findings ASPECTS [facility]([place] Stroke Program Early CT Score) - Ganglionic level infarction (caudate, lentiform nuclei, internal capsule, insula, M1-M3 cortex): 5 - Supraganglionic infarction (M4-M6 cortex): 2 Total score (0-10 with 10 being normal): 7 CTA NECK Aortic arch: Great vessel origins are patent with mild calcified plaque. Right carotid system: Patent. Calcified plaque at the bifurcation and along the proximal internal carotid with less than 50% stenosis. Retropharyngeal course of the ICA. Left carotid system: Patent. Mixed plaque at the bifurcation and proximal internal carotid without stenosis. Retropharyngeal course of the ICA. Vertebral arteries: Patent and codominant.  No stenosis. Skeleton: Mild degenerative changes of the cervical spine. Other neck: Diffusely enlarged but homogeneous appearing thyroid. No ultrasound follow-up is recommended by current guidelines. Upper chest: Mild centrilobular emphysema. Review of the MIP  images confirms the above findings CTA HEAD Anterior circulation: Intracranial internal carotid arteries are patent with calcified plaque along the cavernous and proximal supraclinoid portions. There is moderate to marked stenosis of the proximal left cavernous segment. Additional areas of moderate stenosis bilaterally. Left M1 MCA is patent. There is distal left M2 branch occlusion within the posterior sylvian fissure. Right middle cerebral artery is patent. Anterior cerebral arteries are patent. Anterior communicating artery is present. Posterior circulation: Intracranial vertebral arteries are patent. Basilar artery is patent. Major cerebellar artery origins are patent. Left posterior cerebral artery is patent. There is significantly decreased flow within the right PCA beginning at the P2 segment probably related to prior infarct. Venous sinuses: Patent as allowed by contrast bolus timing. Review of the MIP images confirms the above findings CT Brain Perfusion Findings: CBF (<30%) Volume: 11mL Perfusion (Tmax>6.0s) volume: 39mL Mismatch Volume: 28mL Infarction Location: Left MCA territory IMPRESSION: No acute intracranial hemorrhage. Acute left MCA territory infarction with ASPECT score of 7. Distal left M2 MCA branch occlusion. Perfusion imaging demonstrates core infarction of 11 mL congruent with noncontrast CT findings. There is a calculated penumbra of 28 mL. Additional chronic findings detailed above including infarcts and microvascular ischemic changes. Plaque without hemodynamically significant stenosis at the ICA origins. Calcified plaque along the intracranial internal carotid arteries. Suspect moderate to marked stenosis of  the proximal left cavernous segment. Additional areas of moderate stenosis bilaterally. Significantly decreased flow within the right PCA beginning at the P2 segment probably related to prior infarct. Emphysema. Mild enlargement of the main pulmonary artery, which could indicate  pulmonary arterial hypertension. Initial results were communicated to Dr. Amada Jupiter at 12:27 pm on 01/28/2021 by text page via the [facility] messaging system. Electronically Signed   By: Guadlupe Spanish M.D.   On: 01/28/2021 12:44   CT ANGIO HEAD NECK W WO CM W PERF (CODE STROKE)  Result Date: 01/28/2021 CLINICAL DATA:  Right-sided weakness EXAM: CT ANGIOGRAPHY HEAD AND NECK CT PERFUSION BRAIN TECHNIQUE: Multidetector CT imaging of the head and neck was performed using the standard protocol during bolus administration of intravenous contrast. Multiplanar CT image reconstructions and MIPs were obtained to evaluate the vascular anatomy. Carotid stenosis measurements (when applicable) are obtained utilizing NASCET criteria, using the distal internal carotid diameter as the denominator. Multiphase CT imaging of the brain was performed following IV bolus contrast injection. Subsequent parametric perfusion maps were calculated using RAPID software. CONTRAST:  80 mL Omnipaque 350 COMPARISON:  Report of CTA from 2015 only. Images are unavailable at the time of dictation. FINDINGS: CT HEAD Brain: There is no acute intracranial hemorrhage or mass effect. There is hypoattenuation with loss of gray-white differentiation involving left insula and left frontoparietal lobes (pre and postcentral gyri). There is likely a chronic small left frontal cortical infarct involving the precentral gyrus. Chronic right occipitotemporal infarct. There are age-indeterminate but probably chronic small vessel infarcts of basal ganglia and central white matter bilaterally. Additional patchy and confluent areas of low-attenuation in the supratentorial white matter nonspecific but may reflect moderate chronic microvascular ischemic changes. Vascular: No definite hyperdense vessel. There may be focal hyperdensity of distal left M2 branch within the sylvian fissure, which would correspond to CTA findings. Skull: Calvarium is unremarkable.  Sinuses/Orbits: No acute finding. Other: None. Review of the MIP images confirms the above findings ASPECTS [facility] Stroke Program Early CT Score) - Ganglionic level infarction (caudate, lentiform nuclei, internal capsule, insula, M1-M3 cortex): 5 - Supraganglionic infarction (M4-M6 cortex): 2 Total score (0-10 with 10 being normal): 7 CTA NECK Aortic arch: Great vessel origins are patent with mild calcified plaque. Right carotid system: Patent. Calcified plaque at the bifurcation and along the proximal internal carotid with less than 50% stenosis. Retropharyngeal course of the ICA. Left carotid system: Patent. Mixed plaque at the bifurcation and proximal internal carotid without stenosis. Retropharyngeal course of the ICA. Vertebral arteries: Patent and codominant.  No stenosis. Skeleton: Mild degenerative changes of the cervical spine. Other neck: Diffusely enlarged but homogeneous appearing thyroid. No ultrasound follow-up is recommended by current guidelines. Upper chest: Mild centrilobular emphysema. Review of the MIP images confirms the above findings CTA HEAD Anterior circulation: Intracranial internal carotid arteries are patent with calcified plaque along the cavernous and proximal supraclinoid portions. There is moderate to marked stenosis of the proximal left cavernous segment. Additional areas of moderate stenosis bilaterally. Left M1 MCA is patent. There is distal left M2 branch occlusion within the posterior sylvian fissure. Right middle cerebral artery is patent. Anterior cerebral arteries are patent. Anterior communicating artery is present. Posterior circulation: Intracranial vertebral arteries are patent. Basilar artery is patent. Major cerebellar artery origins are patent. Left posterior cerebral artery is patent. There is significantly decreased flow within the right PCA beginning at the P2 segment probably related to prior infarct. Venous sinuses: Patent as allowed by contrast bolus timing.  Review  of the MIP images confirms the above findings CT Brain Perfusion Findings: CBF (<30%) Volume: 41mL Perfusion (Tmax>6.0s) volume: 66mL Mismatch Volume: 29mL Infarction Location: Left MCA territory IMPRESSION: No acute intracranial hemorrhage. Acute left MCA territory infarction with ASPECT score of 7. Distal left M2 MCA branch occlusion. Perfusion imaging demonstrates core infarction of 11 mL congruent with noncontrast CT findings. There is a calculated penumbra of 28 mL. Additional chronic findings detailed above including infarcts and microvascular ischemic changes. Plaque without hemodynamically significant stenosis at the ICA origins. Calcified plaque along the intracranial internal carotid arteries. Suspect moderate to marked stenosis of the proximal left cavernous segment. Additional areas of moderate stenosis bilaterally. Significantly decreased flow within the right PCA beginning at the P2 segment probably related to prior infarct. Emphysema. Mild enlargement of the main pulmonary artery, which could indicate pulmonary arterial hypertension. Initial results were communicated to Dr. Amada Jupiter at 12:27 pm on 01/28/2021 by text page via the [facility] messaging system. Electronically Signed   By: Guadlupe Spanish M.D.   On: 01/28/2021 12:44     PHYSICAL EXAM  General exam Pleasant middle-aged African-American male, no acute distress HEENT-  Normocephalic, no lesions, without obvious abnormality.  Normal external eye and conjunctiva.   Cardiovascular- regular rhythm, on tele, tachycardic    Neurologic Exam: Alert, orients to name, dob and age and place. Still with expressive aphasia, able to repeat short sentences with severe dysarthric and difficult to understand.  Follows simple commands, comprehension seems intact.  No gaze palsy, tracking bilaterally, visual field full, right facial droop, tongue protrusion to the right.  Right upper extremity is 2/5, right lower extremity 3/5.  Left upper and lower  extremity 5/5.  Sensation decreased on the right.  Left finger-to-nose intact.  Gait not tested.   ASSESSMENT/PLAN [name] is a 65 y.o. male with a past medical history significant for previous stroke, DVT on Xarelto, HTN, CAD/MI, HLD who presented to [facility] after being found by his ex-wife with right-sided weakness and aphasia. On arrival, imaging showed a distal left M2 MCA branch occlusion with a large area of penumbra and the CT perfusion is favorable for intervention and was taken for emergent thrombectomy. More recent MR this morning showed a large acute left MCA branch infarct with petechial hemorrhage.  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 Fib, however, pt refused this and out pt f/u planned for a 30d event monitor instead.  LDL 94  HgbA1c 5.9 Was on aspirin 81, resume aspirin 81 for stroke prevention.  No DAPT at this time given hemorrhagic transformation. Ongoing aggressive stroke risk factor management Therapy recommendations SNF Disposition pending insurance approval; d/w case mgt and he was set to go today, but had to cancel last min d/t insurance certification. Will go tomorrow!  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 Pearlean Brownie 01/30/21 note)  Hypertension Stable Continue lisinopril, amlodipine BP goal less than 160 given hemorrhagic conversion Long-term BP goal normotensive  Hyperlipidemia LDL 94, goal < 70 Add Lipitor 40mg  daily Continue statin at discharge  Dysphagia  Passed swallow with modified diet Speech on board Avoid aspiration  Episode of CP today: Trop neg, 12 EKG is w/o new ST elevation or change when compared to previous. This resolved after indigestion meds. No SOB, no diaphoresis or any radiating pains.   Hospital day # [address] Metzger[place]hel[place], ARNP-C, ANVP-BC Pager: [phone]  Stroke Neurology 02/07/2021 3:49 PM  ATTENDING NOTE: I reviewed above note and agree with the assessment and plan. Pt was seen and examined.   No acute event overnight, however this morning, RN reported patient felt chest tightness, no SOB or vital changes, no diaphoresis or unstable BP.  An EKG showed no ST changes but PACs.  Troponin level 14, within normal limits.  Received Maalox, and symptom resolved.  Still has right hemiplegia and expressive aphasia but both are improving.  Plan to discharge to SNF today.  For detailed assessment and plan, please refer to above as I have made changes wherever appropriate.   Marvel Plan, MD PhD Stroke Neurology 02/07/2021 5:43 PM    To contact Stroke Continuity provider, please refer to [email or web address]. After hours, contact General Neurology

## 2021-02-07 NOTE — TOC Progression Note (Signed)
Transition of Care [facility]) - Progression Note    Patient Details  Name: Kenneth Yu MRN: [id number] Date of Birth: 08-07-1955  Transition of Care [facility]) CM/SW Contact  Baldemar Lenis, [place] Phone Number: 02/07/2021, 3:26 PM  Clinical Narrative:   CSW planned for discharge to SNF today, but notified that [facility] had not contacted insurance yet to change authorization request. CSW obtained previous authorization number from [facility], provided to [facility], who called insurance and got the approval changed but authorization does not start until tomorrow. Patient can admit to SNF tomorrow morning. CSW updated patient's daughter and MD about barrier to discharge.     Expected Discharge Plan: Skilled Nursing Facility Barriers to Discharge: Insurance Authorization  Expected Discharge Plan and Services Expected Discharge Plan: Skilled Nursing Facility   Discharge Planning Services: CM Consult Post Acute Care Choice: IP Rehab Living arrangements for the past 2 months: Apartment Expected Discharge Date: 02/07/21                                     Social Determinants of Health (SDOH) Interventions    Readmission Risk Interventions No flowsheet data found.

## 2021-02-07 NOTE — Progress Notes (Signed)
Patient c/o chest pain; pointing to just above his stomach area; denies radiating pain; patient is warm and dry; MD paged; stat EKG ordered and troponin lab. Vitals are being monitored.

## 2021-02-07 NOTE — Plan of Care (Signed)
  Problem: Education: Goal: Knowledge of disease or condition will improve Outcome: Progressing Goal: Knowledge of secondary prevention will improve Outcome: Progressing Goal: Knowledge of patient specific risk factors addressed and post discharge goals established will improve Outcome: Progressing Goal: Individualized Educational Video(s) Outcome: Progressing   Problem: Coping: Goal: Will verbalize positive feelings about self Outcome: Progressing Goal: Will identify appropriate support needs Outcome: Progressing   Problem: Health Behavior/Discharge Planning: Goal: Ability to manage health-related needs will improve Outcome: Progressing   Problem: Self-Care: Goal: Ability to participate in self-care as condition permits will improve Outcome: Progressing Goal: Verbalization of feelings and concerns over difficulty with self-care will improve Outcome: Progressing Goal: Ability to communicate needs accurately will improve Outcome: Progressing   

## 2021-02-07 NOTE — Consult Note (Signed)
ELECTROPHYSIOLOGY CONSULT NOTE  Patient ID: Briant Angelillo MRN: [id number], DOB/AGE: 65-Mar-1957   Admit date: 01/28/2021 Date of Consult: 02/07/2021  Primary Physician: Default, Provider, MD Primary Cardiologist: None  Primary Electrophysiologist: New to None  Reason for Consultation: Cryptogenic stroke; recommendations regarding Implantable Loop Recorder Insurance: [facility] Medicare Advantage  History of Present Illness EP has been asked to evaluate Zenia Resides for placement of an implantable loop recorder to monitor for atrial fibrillation by Dr Erlinda Hong.  The patient was admitted on 01/28/2021 with right sided weakness and aphasia.  Imaging demonstrated Left MCA infarct due to left M2 occlusion s/p emergent thrombectomy with TICI3, etiology cryptogenic.    He has undergone workup for stroke including:  CT head acute left MCA territory infarct with aspect score 7 CTA head and neck left M2 MCA branch occlusion CTP core infarct 11 cc, penumbra 28 cc Status post IR with TICI3 reperfusion MRI head Large acute left MCA branch infarct with petechial hemorrhage CT head 7/6 showed left MCA infarct with mild hemorrhagic conversion Repeat CT head 7/8 slightly increased left MCA hemorrhagic conversion CT repeat stable left MCA infarct with petechial hemorrhage 2D Echo EF 50 to 55% LDL 94 HgbA1c 5.9 Was on aspirin 81, resume aspirin 81 for stroke prevention.  No DAPT at this time given hemorrhagic transformation. Ongoing aggressive stroke risk factor management   The patient has been monitored on telemetry which has demonstrated sinus rhythm with no arrhythmias.  Inpatient stroke work-up will not require a TEE per Neurology.   Echocardiogram as above. Lab work is reviewed.  Prior to admission, the patient denies chest pain, shortness of breath, dizziness, or syncope. Initially, the notes mention a history of atrial fibrillation.  It was later determined that he was on Xarelto for a diagnosis  of DVT. The patient states that he does have a history of atrial fibrillation, despite reported research to the contrary. I spoke personally to his daughter who is involved in his health care, but not to the point that she manages his medication. He follows with cardiology at [facility], but she is unsure of the specific diagnosis.  He is recovering from his stroke with plans to rehab at SNF  at discharge.  Past Medical History:  Diagnosis Date   DVT (deep venous thrombosis) (HCC)    Hyperlipidemia    Hypertension    Myocardial infarction [facility])      Surgical History:  Past Surgical History:  Procedure Laterality Date   IR CT HEAD LTD  01/28/2021   IR PERCUTANEOUS ART THROMBECTOMY/INFUSION INTRACRANIAL INC DIAG ANGIO  01/28/2021   RADIOLOGY WITH ANESTHESIA N/A 01/28/2021   Procedure: IR WITH ANESTHESIA -;  Surgeon: Luanne Bras, MD;  Location: [place];  Service: Radiology;  Laterality: N/A;     Medications Prior to Admission  Medication Sig Dispense Refill Last Dose   acetaminophen (TYLENOL) 325 MG tablet Take 650 mg by mouth every 4 (four) hours as needed.      aspirin 81 MG tablet Take 81 mg by mouth daily.      atorvastatin (LIPITOR) 80 MG tablet Take 80 mg by mouth daily.      bisacodyl (DULCOLAX) 10 MG suppository Place 10 mg rectally daily as needed for moderate constipation.      docusate sodium (COLACE) 100 MG capsule Take 100 mg by mouth 2 (two) times daily.      lisinopril (PRINIVIL,ZESTRIL) 10 MG tablet Take 10 mg by mouth daily.      magnesium hydroxide (  MILK OF MAGNESIA) 800 MG/5ML suspension Take 30 mLs by mouth daily as needed for constipation.      rivaroxaban (XARELTO) 20 MG TABS tablet Take 20 mg by mouth daily with supper.       Inpatient Medications:   amLODipine  10 mg Oral Daily   aspirin EC  81 mg Oral Daily   atorvastatin  40 mg Oral Daily   chlorhexidine gluconate (MEDLINE KIT)  15 mL Mouth Rinse BID   famotidine  20 mg Oral BID   lisinopril  20 mg Oral BID    senna-docusate  1 tablet Oral BID    Allergies: No Known Allergies  Social History   Socioeconomic History   Marital status: Unknown    Spouse name: Not on file   Number of children: Not on file   Years of education: Not on file   Highest education level: Not on file  Occupational History   Occupation: divorced   Occupation: lives alone  Tobacco Use   Smoking status: Former   Smokeless tobacco: Not on file  Substance and Sexual Activity   Alcohol use: Yes   Drug use: Not on file   Sexual activity: Not on file  Other Topics Concern   Not on file  Social History Narrative   Divorced, lives alone, smoked in past for 30 year   Social Determinants of Health   Financial Resource Strain: Not on file  Food Insecurity: Not on file  Transportation Needs: Not on file  Physical Activity: Not on file  Stress: Not on file  Social Connections: Not on file  Intimate Partner Violence: Not on file     Family History  Problem Relation Age of Onset   Diabetes Sister    Diabetes Brother       Review of Systems: All other systems reviewed and are otherwise negative except as noted above.  Physical Exam: Vitals:   02/07/21 0131 02/07/21 0146 02/07/21 0406 02/07/21 0825  BP: (!) 128/91  123/77 120/80  Pulse: 89  68 85  Resp: _0 Temp: 98.9 F (37.2 C)  97.9 F (36.6 C) 97.9 F (36.6 C)  TempSrc: Oral   Oral  SpO2: 92% 99% 97% 95%  Weight:      Height:        GEN- The patient is well appearing, alert and oriented x 3 today.   Head- normocephalic, atraumatic Eyes-  Sclera clear, conjunctiva pink Ears- hearing intact Oropharynx- clear Neck- supple Lungs- Clear to ausculation bilaterally, normal work of breathing Heart- Regular rate and rhythm, no murmurs, rubs or gallops  GI- soft, NT, ND, + BS Extremities- no clubbing, cyanosis, or edema MS- no significant deformity or atrophy Skin- no rash or lesion Psych- euthymic mood, full affect   Labs:   Lab  Results  Component Value Date   WBC 7.4 02/05/2021   HGB 15.2 02/05/2021   HCT 45.6 02/05/2021   MCV 87.2 02/05/2021   PLT 191 02/05/2021    Recent Labs  Lab 02/05/21 0334  NA 142  K 4.2  CL 106  CO2 27  BUN 18  CREATININE 1.20  CALCIUM 9.3  GLUCOSE 98     Radiology/Studies: CT HEAD WO CONTRAST  Result Date: 02/03/2021 CLINICAL DATA:  Stroke follow-up EXAM: CT HEAD WITHOUT CONTRAST TECHNIQUE: Contiguous axial images were obtained from the base of the skull through the vertex without intravenous contrast. COMPARISON:  Two days ago FINDINGS: Brain: Left MCA branch infarct in the  lateral and posterior frontal lobe with petechial hemorrhage. No progressive finding. Background of advanced chronic small vessel ischemia with confluent gliosis and chronic lacunar infarct at the right basal ganglia. Remote right occipital cortex infarct. Remote right cerebellar infarct. No hydrocephalus or masslike finding Vascular: Stable Skull: Normal. Negative for fracture or focal lesion. Sinuses/Orbits: Bilateral cataract resection IMPRESSION: No adverse evolution of the left MCA branch infarct with petechial hemorrhage. Electronically Signed   By: Monte Fantasia M.D.   On: 02/03/2021 10:32   CT HEAD WO CONTRAST  Result Date: 02/01/2021 CLINICAL DATA:  Left frontal headache EXAM: CT HEAD WITHOUT CONTRAST TECHNIQUE: Contiguous axial images were obtained from the base of the skull through the vertex without intravenous contrast. COMPARISON:  01/30/2021 FINDINGS: Brain: Evolving left MCA territory infarction is again identified. Gyriform hyperdensity is again noted reflecting petechial hemorrhage. This does not appear substantially changed from the prior study. Additional smaller acute infarcts are better seen on the prior MRI. Chronic infarcts including involvement of right occipital lobe, right medial temporal lobe, right caudate, and right cerebellum are again identified. Stable findings of probable chronic  microvascular ischemic changes. No significant mass effect.  No hydrocephalus. Vascular: No new finding. Skull: Calvarium is unremarkable. Sinuses/Orbits: No acute finding. Other: None. IMPRESSION: Evolving recent left MCA territory infarction with similar petechial hemorrhage. No significant mass effect. Electronically Signed   By: Macy Mis M.D.   On: 02/01/2021 09:32   CT Head Wo Contrast  Result Date: 01/30/2021 CLINICAL DATA:  Stroke follow-up. EXAM: CT HEAD WITHOUT CONTRAST TECHNIQUE: Contiguous axial images were obtained from the base of the skull through the vertex without intravenous contrast. COMPARISON:  Brain MRI 01/29/2021. Noncontrast head CT and CT angiogram head/neck 01/28/2021. FINDINGS: Brain: Mild generalized parenchymal atrophy. Redemonstrated evolving acute cortically based left MCA territory infarct within the left frontoparietal lobes and left insula. Redemonstrated mild corresponding petechial hemorrhage. Small acute infarcts were better appreciated on the prior MRI. Chronic cortically based right occipital temporal lobe infarct. Chronic lacunar infarcts within the bilateral corona radiata and deep gray nuclei. Stable background moderate/advanced chronic small vessel ischemic disease within the cerebral white matter. Redemonstrated small chronic infarct within the right cerebellar hemisphere. No extra-axial fluid collection. No evidence of an intracranial mass. No midline shift. Vascular: Atherosclerotic calcifications Skull: Normal. Negative for fracture or focal lesion. Sinuses/Orbits: Visualized orbits show no acute finding. Trace mucosal thickening within the right ethmoid air cells. Fluid levels and background mild mucosal thickening within the right greater than left sphenoid sinuses. Trace left maxillary sinus mucosal thickening. IMPRESSION: Evolving moderate to large acute left MCA territory infarct, not appreciably changed in extent from the brain MRI of 01/29/2021. As  before, there is mild petechial hemorrhage within the infarction territory. Associated small acute infarcts within the left caudate nucleus were better appreciated on the prior MRI. Otherwise stable non-contrast CT appearance of the brain. 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 Background chronic small vessel ischemic disease with multiple chronic lacunar infarcts. Small chronic infarct within the right cerebellar hemisphere. Mild generalized cerebral atrophy. Paranasal sinus disease, as described. Electronically Signed   By: Kellie Simmering DO   On: 01/30/2021 08:30   MR BRAIN WO CONTRAST  Result Date: 01/29/2021 CLINICAL DATA:  Right-sided weakness.  History of stroke EXAM: MRI HEAD WITHOUT CONTRAST TECHNIQUE: Multiplanar, multiecho pulse sequences of the brain and surrounding structures were obtained without intravenous contrast. COMPARISON:  CT and CTA from yesterday FINDINGS: Brain: Large area of cortically based infarction in the  lateral left frontal lobe, MCA branch distribution. Minimal patchy left caudate acute infarcts. Petechial hemorrhage is present. Remote cortically based infarct affecting the right occipital lobe. Chronic lacunar infarcts in the bilateral deep gray nuclei. Chronic small right cerebellar infarct. Confluent chronic small vessel ischemic gliosis in the deep white matter. Brain volume is normal. Chronic micro hemorrhages which are mainly peripheral but in areas that were likely affected by prior infarction. No hydrocephalus, collection, or masslike finding. Vascular: Preserved flow voids. Skull and upper cervical spine: Normal marrow signal Sinuses/Orbits: Nasopharyngeal and sinus opacification in the setting of intubation. Bilateral cataract resection. IMPRESSION: 1. Large acute left MCA branch infarct with petechial hemorrhage. Minimal involvement in the left caudate head. 2. Chronic small vessel disease with chronic lacunar infarcts. Prior right PCA territory infarct.  Electronically Signed   By: Monte Fantasia M.D.   On: 01/29/2021 05:29   IR CT Head Ltd  Result Date: 01/30/2021 INDICATION: New onset aphasia with right-sided weakness. Occluded superior division of the left middle cerebral artery proximally on CT angiogram of the head and neck. EXAM: 1. EMERGENT LARGE VESSEL OCCLUSION THROMBOLYSIS (anterior CIRCULATION) COMPARISON:  CT angiogram of the head and neck of January 28, 2021. MEDICATIONS: Ancef 2 g IV antibiotic was administered within 1 hour of the procedure. ANESTHESIA/SEDATION: General anesthesia. CONTRAST:  Omnipaque 300 55 mL. FLUOROSCOPY TIME:  Fluoroscopy Time: 20 minutes 18 seconds (1160 mGy). COMPLICATIONS: None immediate. TECHNIQUE: Emergent consent was signed to physicians. No family members or next of kin were available on phone or physically. Patient was unable to provide consent due to his medical condition. The patient was then put under general anesthesia by the Department of Anesthesiology at [facility]. The right groin was prepped and draped in the usual sterile fashion. Thereafter using modified Seldinger technique, transfemoral access into the right common femoral artery was obtained without difficulty. Over a 0.035 inch guidewire an 8 [place] Pinnacle 25 cm sheath was inserted. Through this, and also over a 0.035 inch guidewire a 5 [place] JB 1 catheter was advanced to the aortic arch region and selectively positioned in the left common carotid artery. FINDINGS: The left common carotid arteriogram demonstrates the left external carotid artery and its major branches to be widely patent. The left internal carotid artery at the bulb has mild narrowing proximally. More distally the vessel is seen to opacify to the cranial skull base. The petrous, cavernous and supraclinoid segments are patent with focal area of mild stenosis in the caval segment. The left anterior cerebral artery opacifies normally into the capillary and venous phases. The left  middle cerebral artery M1 segment is widely patent. The inferior division is normal into the capillary and venous phases. The superior division demonstrates a prominent side branch occlusion proximally. PROCEDURE: The diagnostic JB 1 catheter in the left common carotid artery was then exchanged over a 0.035 inch 300 cm Rosen exchange guidewire for an 087 balloon guide catheter which had been prepped with 50% contrast and 50% heparinized saline infusion. Guidewire was removed. Good aspiration obtained from the hub of the balloon guide catheter. Gentle control arteriogram performed through this demonstrated no evidence of spasms, dissections or of intraluminal filling defects. Over a 0.035 Roadrunner guidewire, this was then advanced to the distal 1/3 of the left internal carotid artery. The guidewire was removed. Again good aspiration obtained from the hub of the balloon guide catheter. Control arteriogram performed showed no evidence of spasms, dissections or of intraluminal filling defects. Over a 0.014 inch  standard Synchro micro guidewire with a J-tip configuration the combination of an 055 Zoom aspiration and 136 cm catheter inside of which was a 160 cm 021 Trevo ProVue microcatheter was advanced to the supraclinoid left ICA. The micro guidewire was then gently maneuvered with a torque device into the occluded segment of the left middle cerebral artery superior division into the distal M3 region followed by the microcatheter. The guidewire was removed. Good aspiration obtained from the hub of the microcatheter. Gentle control arteriogram performed through the microcatheter demonstrated safe position of the tip of the microcatheter with antegrade flow of contrast. This was then connected to continuous heparin saline infusion. A 4 mm x 40 mm Solitaire X retrieval device was then advanced to the distal end of the microcatheter. O ring on the delivery microcatheter was loosened. With slight forward gentle traction  with the right hand on the delivery micro guidewire with the left hand the delivery microcatheter was retrieved deploying the retrieval device the proximal end of which was just proximal to the occluded inferior division. 055 Zoom aspiration catheter was advanced and engaged at the origin of the occluded branch. With proximal flow arrest in the left internal carotid artery, and with constant aspiration being applied the hub of 055 aspiration catheter with the Penumbra aspiration device, and a 20 mL syringe at the hub of the balloon guide catheter in the left internal carotid artery for approximately 2-1/2 minutes, the combination of the retrieval device, the microcatheter, and the Zoom aspiration catheter was retrieved and removed. Flow arrest was reversed in the left internal carotid artery. A control arteriogram performed through the balloon guide in the proximal left internal carotid artery demonstrated moderate spasm in the mid cervical left ICA. A 3 mm x 1 mm clot was noted entangled in the retrieval device. Wide patency was now noted of the previously occluded superior division inferior branch achieving a TICI 3 revascularization of the left middle cerebral artery distribution. The left anterior cerebral artery remained unchanged widely patent. Spasm noted at the distal end of the left internal carotid artery gradually resolved after 4 aliquots of 25 mcg of nitroglycerin intra-arterially. The balloon guide was then retrieved into the left common carotid artery. A final control arteriogram performed through the balloon guide catheter demonstrated near complete resolution of the previous noted spasm in the left internal carotid artery distally. The left MCA and ACA distributions continued to show complete revascularization. The balloon guide was then removed. The 8 French sheath was removed with hemostasis achieved with manual compression for approximately 20 minutes. Distal pulses remained Dopplerable in both  feet unchanged. A CT of the brain demonstrated no evidence of intracranial hemorrhages. Patient was left intubated waiting the results of the COVID-19 test. IMPRESSION: Status post endovascular complete revascularization of occluded prominent side branch of the superior division of the left middle cerebral artery with 1 pass with a 4 mm x 40 mm Solitaire X retrieval device and contact aspiration achieving a TICI 3 revascularization. PLAN: Follow-up as per referring MD. Electronically Signed   By: Luanne Bras M.D.   On: 01/29/2021 13:05   DG CHEST PORT 1 VIEW  Result Date: 01/29/2021 CLINICAL DATA:  Endotracheal tube placement EXAM: PORTABLE CHEST 1 VIEW COMPARISON:  08/07/2018 FINDINGS: Endotracheal tube placed with tip measuring 3.6 cm above the carina. Enteric tube placed with tip not visualized off the field of view but below the left hemidiaphragm. Cardiac enlargement. Small left pleural effusion. Atelectasis or infiltration in both lung bases.  No pneumothorax. Calcification of the aorta. IMPRESSION: Appliances appear in satisfactory position. Probable small left pleural effusion with atelectasis or infiltration in both lung bases. Electronically Signed   By: Lucienne Capers M.D.   On: 01/29/2021 02:49   DG Abd Portable 1V  Result Date: 01/28/2021 CLINICAL DATA:  OG tube placement. EXAM: PORTABLE ABDOMEN - 1 VIEW COMPARISON:  None. FINDINGS: Tip and side port of the enteric tube below the diaphragm in the stomach. Air throughout nondilated small and large bowel in the upper abdomen. IMPRESSION: Tip and side port of the enteric tube below the diaphragm in the stomach. Electronically Signed   By: Keith Rake M.D.   On: 01/28/2021 18:02   EEG adult  Result Date: 02/02/2021 Lora Havens, MD     02/02/2021  8:41 AM Patient Name: Sair Faulcon MRN: [id number] Epilepsy Attending: Lora Havens Referring Physician/Provider: Dr Rosalin Hawking Date: 02/01/2021 Duration: 29.74mns Patient history: Per  RN and speech therapies, patient this morning had acute onset left forehead headache, CT repeat showed a slight increase of hemorrhagic conversion on the left.  However, patient later has no recollection of the headache and CT repeat.  EEG to rule out a seizure episode.  Level of alertness: Awake, asleep AEDs during EEG study: None Technical aspects: This EEG study was done with scalp electrodes positioned according to the 10-20 International system of electrode placement. Electrical activity was acquired at a sampling rate of 500Hz and reviewed with a high frequency filter of 70Hz and a low frequency filter of 1Hz. EEG data were recorded continuously and digitally stored. Description: The posterior dominant rhythm consists of 9 Hz activity of moderate voltage (25-35 uV) seen predominantly in posterior head regions, symmetric and reactive to eye opening and eye closing. Sleep was characterized by vertex waves, sleep spindles (12 to 14 Hz), maximal frontocentral region. EEG showed continuous left frontotemporal 3 to 6 Hz theta-delta slowing. Hyperventilation and photic stimulation were not performed.   ABNORMALITY - Continuous slow,  left frontotemporal region IMPRESSION: This study is suggestive of cortical dysfunction arising from left frontotemporal region likely secondary to underlying structural abnormality/stroke. No seizures or epileptiform discharges were seen throughout the recording. PLora Havens  ECHOCARDIOGRAM COMPLETE  Result Date: 01/29/2021    ECHOCARDIOGRAM REPORT   Patient Name:   WTOMOKI LUCKENDate of Exam: 01/29/2021 Medical Rec #:  [id number]    Height:       66.0 in Accession #:    [id number]   Weight:       202.2 lb Date of Birth:  123-Apr-1957   BSA:          2.009 m Patient Age:    65years      BP:           143/82 mmHg Patient Gender: M             HR:           98 bpm. Exam Location:  Inpatient Procedure: 2D Echo, Cardiac Doppler, Color Doppler and Intracardiac             Opacification Agent Indications:    Stroke  History:        Patient has no prior history of Echocardiogram examinations.                 Arrythmias:Atrial Fibrillation; Risk Factors:Former Smoker. H/O  CVA.  Sonographer:    Clayton Lefort RDCS (AE) Referring Phys: [phone] [facility]  Sonographer Comments: Suboptimal apical window. IMPRESSIONS  1. Left ventricular ejection fraction, by estimation, is 50 to 55%. The left ventricle has low normal function. The left ventricle demonstrates regional wall motion abnormalities (lateral wall hypokinesis). There is mild concentric left ventricular hypertrophy. Left ventricular diastolic parameters are consistent with Grade II diastolic dysfunction (pseudonormalization).  2. Right ventricular systolic function is normal. The right ventricular size is mildly enlarged. There is mildly elevated pulmonary artery systolic pressure. The estimated right ventricular systolic pressure is 96.7 mmHg.  3. Left atrial size was mildly dilated.  4. Right atrial size was severely dilated.  5. The mitral valve is grossly normal. Mild mitral valve regurgitation. The mean mitral valve gradient is 3.0 mmHg with average heart rate of 89 bpm.  6. The aortic valve is tricuspid. There is mild calcification of the aortic valve. There is mild thickening of the aortic valve. Aortic valve regurgitation is not visualized. Mild aortic valve sclerosis is present, with no evidence of aortic valve stenosis.  7. The inferior vena cava is normal in size with <50% respiratory variability, suggesting right atrial pressure of 8 mmHg. FINDINGS  Left Ventricle: Left ventricular ejection fraction, by estimation, is 50 to 55%. The left ventricle has low normal function. The left ventricle demonstrates regional wall motion abnormalities. Definity contrast agent was given IV to delineate the left ventricular endocardial borders. The left ventricular internal cavity size was normal in size. There is  mild concentric left ventricular hypertrophy. Left ventricular diastolic parameters are consistent with Grade II diastolic dysfunction (pseudonormalization).  LV Wall Scoring: The mid and distal lateral wall, posterior wall, mid anterolateral segment, and apical anterior segment are hypokinetic. The basal anterolateral segment is normal. Right Ventricle: The right ventricular size is mildly enlarged. No increase in right ventricular wall thickness. Right ventricular systolic function is normal. There is mildly elevated pulmonary artery systolic pressure. The tricuspid regurgitant velocity is 2.85 m/s, and with an assumed right atrial pressure of 8 mmHg, the estimated right ventricular systolic pressure is 59.1 mmHg. Left Atrium: Left atrial size was mildly dilated. Right Atrium: Right atrial size was severely dilated. Pericardium: There is no evidence of pericardial effusion. Mitral Valve: The mitral valve is grossly normal. Mild mitral valve regurgitation. MV peak gradient, 6.8 mmHg. The mean mitral valve gradient is 3.0 mmHg with average heart rate of 89 bpm. Tricuspid Valve: The tricuspid valve is normal in structure. Tricuspid valve regurgitation is mild . No evidence of tricuspid stenosis. Aortic Valve: The aortic valve is tricuspid. There is mild calcification of the aortic valve. There is mild thickening of the aortic valve. Aortic valve regurgitation is not visualized. Mild aortic valve sclerosis is present, with no evidence of aortic valve stenosis. Aortic valve mean gradient measures 5.0 mmHg. Aortic valve peak gradient measures 10.1 mmHg. Aortic valve area, by VTI measures 1.59 cm. Pulmonic Valve: The pulmonic valve was normal in structure. Pulmonic valve regurgitation is not visualized. No evidence of pulmonic stenosis. Aorta: The aortic root and ascending aorta are structurally normal, with no evidence of dilitation. Venous: The inferior vena cava is normal in size with less than 50% respiratory  variability, suggesting right atrial pressure of 8 mmHg. IAS/Shunts: The atrial septum is grossly normal.  LEFT VENTRICLE PLAX 2D LVIDd:         5.10 cm  Diastology LVIDs:         3.60 cm  LV e' medial:    7.40 cm/s LV PW:         1.60 cm  LV E/e' medial:  15.1 LV IVS:        1.80 cm  LV e' lateral:   7.78 cm/s LVOT diam:     1.90 cm  LV E/e' lateral: 14.4 LV SV:         44 LV SV Index:   22 LVOT Area:     2.84 cm  RIGHT VENTRICLE             IVC RV Basal diam:  4.30 cm     IVC diam: 2.30 cm RV S prime:     12.30 cm/s TAPSE (M-mode): 1.4 cm LEFT ATRIUM             Index       RIGHT ATRIUM           Index LA diam:        3.60 cm 1.79 cm/m  RA Area:     36.50 cm LA Vol (A2C):   73.0 ml 36.34 ml/m RA Volume:   156.00 ml 77.65 ml/m LA Vol (A4C):   73.0 ml 36.34 ml/m LA Biplane Vol: 74.3 ml 36.98 ml/m  AORTIC VALVE AV Area (Vmax):    1.66 cm AV Area (Vmean):   1.40 cm AV Area (VTI):     1.59 cm AV Vmax:           159.00 cm/s AV Vmean:          108.000 cm/s AV VTI:            0.275 m AV Peak Grad:      10.1 mmHg AV Mean Grad:      5.0 mmHg LVOT Vmax:         93.20 cm/s LVOT Vmean:        53.200 cm/s LVOT VTI:          0.154 m LVOT/AV VTI ratio: 0.56  AORTA Ao Root diam: 3.10 cm Ao Asc diam:  3.60 cm MITRAL VALVE                TRICUSPID VALVE MV Area (PHT): 4.41 cm     TR Peak grad:   32.5 mmHg MV Area VTI:   1.85 cm     TR Vmax:        285.00 cm/s MV Peak grad:  6.8 mmHg MV Mean grad:  3.0 mmHg     SHUNTS MV Vmax:       1.30 m/s     Systemic VTI:  0.15 m MV Vmean:      75.2 cm/s    Systemic Diam: 1.90 cm MV Decel Time: 172 msec MV E velocity: 112.00 cm/s MV A velocity: 121.00 cm/s MV E/A ratio:  0.93 Rudean Haskell MD Electronically signed by Rudean Haskell MD Signature Date/Time: 01/29/2021/12:28:37 PM    Final    IR PERCUTANEOUS ART THROMBECTOMY/INFUSION INTRACRANIAL INC DIAG ANGIO  Result Date: 01/30/2021 INDICATION: New onset aphasia with right-sided weakness. Occluded superior division of  the left middle cerebral artery proximally on CT angiogram of the head and neck. EXAM: 1. EMERGENT LARGE VESSEL OCCLUSION THROMBOLYSIS (anterior CIRCULATION) COMPARISON:  CT angiogram of the head and neck of January 28, 2021. MEDICATIONS: Ancef 2 g IV antibiotic was administered within 1 hour of the procedure. ANESTHESIA/SEDATION: General anesthesia. CONTRAST:  Omnipaque 300 55 mL. FLUOROSCOPY TIME:  Fluoroscopy Time: 20 minutes 18 seconds (1160 mGy).  COMPLICATIONS: None immediate. TECHNIQUE: Emergent consent was signed to physicians. No family members or next of kin were available on phone or physically. Patient was unable to provide consent due to his medical condition. The patient was then put under general anesthesia by the Department of Anesthesiology at [facility]. The right groin was prepped and draped in the usual sterile fashion. Thereafter using modified Seldinger technique, transfemoral access into the right common femoral artery was obtained without difficulty. Over a 0.035 inch guidewire an 8 [place] Pinnacle 25 cm sheath was inserted. Through this, and also over a 0.035 inch guidewire a 5 [place] JB 1 catheter was advanced to the aortic arch region and selectively positioned in the left common carotid artery. FINDINGS: The left common carotid arteriogram demonstrates the left external carotid artery and its major branches to be widely patent. The left internal carotid artery at the bulb has mild narrowing proximally. More distally the vessel is seen to opacify to the cranial skull base. The petrous, cavernous and supraclinoid segments are patent with focal area of mild stenosis in the caval segment. The left anterior cerebral artery opacifies normally into the capillary and venous phases. The left middle cerebral artery M1 segment is widely patent. The inferior division is normal into the capillary and venous phases. The superior division demonstrates a prominent side branch occlusion proximally.  PROCEDURE: The diagnostic JB 1 catheter in the left common carotid artery was then exchanged over a 0.035 inch 300 cm Rosen exchange guidewire for an 087 balloon guide catheter which had been prepped with 50% contrast and 50% heparinized saline infusion. Guidewire was removed. Good aspiration obtained from the hub of the balloon guide catheter. Gentle control arteriogram performed through this demonstrated no evidence of spasms, dissections or of intraluminal filling defects. Over a 0.035 Roadrunner guidewire, this was then advanced to the distal 1/3 of the left internal carotid artery. The guidewire was removed. Again good aspiration obtained from the hub of the balloon guide catheter. Control arteriogram performed showed no evidence of spasms, dissections or of intraluminal filling defects. Over a 0.014 inch standard Synchro micro guidewire with a J-tip configuration the combination of an 055 Zoom aspiration and 136 cm catheter inside of which was a 160 cm 021 Trevo ProVue microcatheter was advanced to the supraclinoid left ICA. The micro guidewire was then gently maneuvered with a torque device into the occluded segment of the left middle cerebral artery superior division into the distal M3 region followed by the microcatheter. The guidewire was removed. Good aspiration obtained from the hub of the microcatheter. Gentle control arteriogram performed through the microcatheter demonstrated safe position of the tip of the microcatheter with antegrade flow of contrast. This was then connected to continuous heparin saline infusion. A 4 mm x 40 mm Solitaire X retrieval device was then advanced to the distal end of the microcatheter. O ring on the delivery microcatheter was loosened. With slight forward gentle traction with the right hand on the delivery micro guidewire with the left hand the delivery microcatheter was retrieved deploying the retrieval device the proximal end of which was just proximal to the occluded  inferior division. 055 Zoom aspiration catheter was advanced and engaged at the origin of the occluded branch. With proximal flow arrest in the left internal carotid artery, and with constant aspiration being applied the hub of 055 aspiration catheter with the Penumbra aspiration device, and a 20 mL syringe at the hub of the balloon guide catheter in the left internal carotid  artery for approximately 2-1/2 minutes, the combination of the retrieval device, the microcatheter, and the Zoom aspiration catheter was retrieved and removed. Flow arrest was reversed in the left internal carotid artery. A control arteriogram performed through the balloon guide in the proximal left internal carotid artery demonstrated moderate spasm in the mid cervical left ICA. A 3 mm x 1 mm clot was noted entangled in the retrieval device. Wide patency was now noted of the previously occluded superior division inferior branch achieving a TICI 3 revascularization of the left middle cerebral artery distribution. The left anterior cerebral artery remained unchanged widely patent. Spasm noted at the distal end of the left internal carotid artery gradually resolved after 4 aliquots of 25 mcg of nitroglycerin intra-arterially. The balloon guide was then retrieved into the left common carotid artery. A final control arteriogram performed through the balloon guide catheter demonstrated near complete resolution of the previous noted spasm in the left internal carotid artery distally. The left MCA and ACA distributions continued to show complete revascularization. The balloon guide was then removed. The 8 French sheath was removed with hemostasis achieved with manual compression for approximately 20 minutes. Distal pulses remained Dopplerable in both feet unchanged. A CT of the brain demonstrated no evidence of intracranial hemorrhages. Patient was left intubated waiting the results of the COVID-19 test. IMPRESSION: Status post endovascular complete  revascularization of occluded prominent side branch of the superior division of the left middle cerebral artery with 1 pass with a 4 mm x 40 mm Solitaire X retrieval device and contact aspiration achieving a TICI 3 revascularization. PLAN: Follow-up as per referring MD. Electronically Signed   By: Luanne Bras M.D.   On: 01/29/2021 13:05   CT HEAD CODE STROKE WO CONTRAST  Result Date: 01/28/2021 CLINICAL DATA:  Right-sided weakness EXAM: CT ANGIOGRAPHY HEAD AND NECK CT PERFUSION BRAIN TECHNIQUE: Multidetector CT imaging of the head and neck was performed using the standard protocol during bolus administration of intravenous contrast. Multiplanar CT image reconstructions and MIPs were obtained to evaluate the vascular anatomy. Carotid stenosis measurements (when applicable) are obtained utilizing NASCET criteria, using the distal internal carotid diameter as the denominator. Multiphase CT imaging of the brain was performed following IV bolus contrast injection. Subsequent parametric perfusion maps were calculated using RAPID software. CONTRAST:  80 mL Omnipaque 350 COMPARISON:  Report of CTA from 2015 only. Images are unavailable at the time of dictation. FINDINGS: CT HEAD Brain: There is no acute intracranial hemorrhage or mass effect. There is hypoattenuation with loss of gray-white differentiation involving left insula and left frontoparietal lobes (pre and postcentral gyri). There is likely a chronic small left frontal cortical infarct involving the precentral gyrus. Chronic right occipitotemporal infarct. There are age-indeterminate but probably chronic small vessel infarcts of basal ganglia and central white matter bilaterally. Additional patchy and confluent areas of low-attenuation in the supratentorial white matter nonspecific but may reflect moderate chronic microvascular ischemic changes. Vascular: No definite hyperdense vessel. There may be focal hyperdensity of distal left M2 branch within the  sylvian fissure, which would correspond to CTA findings. Skull: Calvarium is unremarkable. Sinuses/Orbits: No acute finding. Other: None. Review of the MIP images confirms the above findings ASPECTS [facility] Stroke Program Early CT Score) - Ganglionic level infarction (caudate, lentiform nuclei, internal capsule, insula, M1-M3 cortex): 5 - Supraganglionic infarction (M4-M6 cortex): 2 Total score (0-10 with 10 being normal): 7 CTA NECK Aortic arch: Great vessel origins are patent with mild calcified plaque. Right carotid system: Patent. Calcified  plaque at the bifurcation and along the proximal internal carotid with less than 50% stenosis. Retropharyngeal course of the ICA. Left carotid system: Patent. Mixed plaque at the bifurcation and proximal internal carotid without stenosis. Retropharyngeal course of the ICA. Vertebral arteries: Patent and codominant.  No stenosis. Skeleton: Mild degenerative changes of the cervical spine. Other neck: Diffusely enlarged but homogeneous appearing thyroid. No ultrasound follow-up is recommended by current guidelines. Upper chest: Mild centrilobular emphysema. Review of the MIP images confirms the above findings CTA HEAD Anterior circulation: Intracranial internal carotid arteries are patent with calcified plaque along the cavernous and proximal supraclinoid portions. There is moderate to marked stenosis of the proximal left cavernous segment. Additional areas of moderate stenosis bilaterally. Left M1 MCA is patent. There is distal left M2 branch occlusion within the posterior sylvian fissure. Right middle cerebral artery is patent. Anterior cerebral arteries are patent. Anterior communicating artery is present. Posterior circulation: Intracranial vertebral arteries are patent. Basilar artery is patent. Major cerebellar artery origins are patent. Left posterior cerebral artery is patent. There is significantly decreased flow within the right PCA beginning at the P2 segment  probably related to prior infarct. Venous sinuses: Patent as allowed by contrast bolus timing. Review of the MIP images confirms the above findings CT Brain Perfusion Findings: CBF (<30%) Volume: 89m Perfusion (Tmax>6.0s) volume: 354mMismatch Volume: 2865mnfarction Location: Left MCA territory IMPRESSION: No acute intracranial hemorrhage. Acute left MCA territory infarction with ASPECT score of 7. Distal left M2 MCA branch occlusion. Perfusion imaging demonstrates core infarction of 11 mL congruent with noncontrast CT findings. There is a calculated penumbra of 28 mL. Additional chronic findings detailed above including infarcts and microvascular ischemic changes. Plaque without hemodynamically significant stenosis at the ICA origins. Calcified plaque along the intracranial internal carotid arteries. Suspect moderate to marked stenosis of the proximal left cavernous segment. Additional areas of moderate stenosis bilaterally. Significantly decreased flow within the right PCA beginning at the P2 segment probably related to prior infarct. Emphysema. Mild enlargement of the main pulmonary artery, which could indicate pulmonary arterial hypertension. Initial results were communicated to Dr. KirLeonel Ramsay 12:27 pm on 01/28/2021 by text page via the [facility] system. Electronically Signed   By: PraMacy MisD.   On: 01/28/2021 12:44   CT ANGIO HEAD NECK W WO CM W PERF (CODE STROKE)  Result Date: 01/28/2021 CLINICAL DATA:  Right-sided weakness EXAM: CT ANGIOGRAPHY HEAD AND NECK CT PERFUSION BRAIN TECHNIQUE: Multidetector CT imaging of the head and neck was performed using the standard protocol during bolus administration of intravenous contrast. Multiplanar CT image reconstructions and MIPs were obtained to evaluate the vascular anatomy. Carotid stenosis measurements (when applicable) are obtained utilizing NASCET criteria, using the distal internal carotid diameter as the denominator. Multiphase CT imaging  of the brain was performed following IV bolus contrast injection. Subsequent parametric perfusion maps were calculated using RAPID software. CONTRAST:  80 mL Omnipaque 350 COMPARISON:  Report of CTA from 2015 only. Images are unavailable at the time of dictation. FINDINGS: CT HEAD Brain: There is no acute intracranial hemorrhage or mass effect. There is hypoattenuation with loss of gray-white differentiation involving left insula and left frontoparietal lobes (pre and postcentral gyri). There is likely a chronic small left frontal cortical infarct involving the precentral gyrus. Chronic right occipitotemporal infarct. There are age-indeterminate but probably chronic small vessel infarcts of basal ganglia and central white matter bilaterally. Additional patchy and confluent areas of low-attenuation in the supratentorial white matter nonspecific but  may reflect moderate chronic microvascular ischemic changes. Vascular: No definite hyperdense vessel. There may be focal hyperdensity of distal left M2 branch within the sylvian fissure, which would correspond to CTA findings. Skull: Calvarium is unremarkable. Sinuses/Orbits: No acute finding. Other: None. Review of the MIP images confirms the above findings ASPECTS [facility] Stroke Program Early CT Score) - Ganglionic level infarction (caudate, lentiform nuclei, internal capsule, insula, M1-M3 cortex): 5 - Supraganglionic infarction (M4-M6 cortex): 2 Total score (0-10 with 10 being normal): 7 CTA NECK Aortic arch: Great vessel origins are patent with mild calcified plaque. Right carotid system: Patent. Calcified plaque at the bifurcation and along the proximal internal carotid with less than 50% stenosis. Retropharyngeal course of the ICA. Left carotid system: Patent. Mixed plaque at the bifurcation and proximal internal carotid without stenosis. Retropharyngeal course of the ICA. Vertebral arteries: Patent and codominant.  No stenosis. Skeleton: Mild degenerative changes  of the cervical spine. Other neck: Diffusely enlarged but homogeneous appearing thyroid. No ultrasound follow-up is recommended by current guidelines. Upper chest: Mild centrilobular emphysema. Review of the MIP images confirms the above findings CTA HEAD Anterior circulation: Intracranial internal carotid arteries are patent with calcified plaque along the cavernous and proximal supraclinoid portions. There is moderate to marked stenosis of the proximal left cavernous segment. Additional areas of moderate stenosis bilaterally. Left M1 MCA is patent. There is distal left M2 branch occlusion within the posterior sylvian fissure. Right middle cerebral artery is patent. Anterior cerebral arteries are patent. Anterior communicating artery is present. Posterior circulation: Intracranial vertebral arteries are patent. Basilar artery is patent. Major cerebellar artery origins are patent. Left posterior cerebral artery is patent. There is significantly decreased flow within the right PCA beginning at the P2 segment probably related to prior infarct. Venous sinuses: Patent as allowed by contrast bolus timing. Review of the MIP images confirms the above findings CT Brain Perfusion Findings: CBF (<30%) Volume: 24m Perfusion (Tmax>6.0s) volume: 372mMismatch Volume: 2854mnfarction Location: Left MCA territory IMPRESSION: No acute intracranial hemorrhage. Acute left MCA territory infarction with ASPECT score of 7. Distal left M2 MCA branch occlusion. Perfusion imaging demonstrates core infarction of 11 mL congruent with noncontrast CT findings. There is a calculated penumbra of 28 mL. Additional chronic findings detailed above including infarcts and microvascular ischemic changes. Plaque without hemodynamically significant stenosis at the ICA origins. Calcified plaque along the intracranial internal carotid arteries. Suspect moderate to marked stenosis of the proximal left cavernous segment. Additional areas of moderate  stenosis bilaterally. Significantly decreased flow within the right PCA beginning at the P2 segment probably related to prior infarct. Emphysema. Mild enlargement of the main pulmonary artery, which could indicate pulmonary arterial hypertension. Initial results were communicated to Dr. KirLeonel Ramsay 12:27 pm on 01/28/2021 by text page via the [facility] system. Electronically Signed   By: PraMacy MisD.   On: 01/28/2021 12:44    12-lead ECG sinus tachycardia at 106 bpm (personally reviewed) No prior EKGs to review, and unfortunately no [facility] 60-80[facility] (personally reviewed)  Assessment and Plan:  1. Cryptogenic stroke The patient presents with cryptogenic stroke.  The patient does not have a TEE planned for this AM.  I spoke at length with the patient about monitoring for afib with an implantable loop recorder vs event monitor.  I spoke with his daughter as well.  Pt follows with [facility] for unclear diagnosis Discussed with Dr. LamQuentin Oreith unclear cardiac diagnosis and pts continues  insistence that he has been told he has atrial fibrillation, we recommend monitoring with an event monitor and following up as an outpatient to discuss possible loop recorder further.   Monitor ordered and follow up made.   Shirley Friar, PA-C 02/07/2021 9:32 AM

## 2021-02-08 ENCOUNTER — Other Ambulatory Visit: Payer: Self-pay | Admitting: Neurology

## 2021-02-08 DIAGNOSIS — I63412 Cerebral infarction due to embolism of left middle cerebral artery: Secondary | ICD-10-CM

## 2021-02-08 LAB — BASIC METABOLIC PANEL
Anion gap: 8 (ref 5–15)
BUN: 18 mg/dL (ref 8–23)
CO2: 24 mmol/L (ref 22–32)
Calcium: 9.4 mg/dL (ref 8.9–10.3)
Chloride: 107 mmol/L (ref 98–111)
Creatinine, Ser: 1.17 mg/dL (ref 0.61–1.24)
GFR, Estimated: >60 mL/min (ref >60)
Glucose, Bld: 99 mg/dL (ref 70–99)
Potassium: 3.9 mmol/L (ref 3.5–5.1)
Sodium: 139 mmol/L (ref 135–145)

## 2021-02-08 LAB — CBC
HCT: 45.7 % (ref 39.0–52.0)
Hemoglobin: 15.5 g/dL (ref 13.0–17.0)
MCH: 29.1 pg (ref 26.0–34.0)
MCHC: 33.9 g/dL (ref 30.0–36.0)
MCV: 85.9 fL (ref 80.0–100.0)
Platelets: 180 10*3/uL (ref 150–400)
RBC: 5.32 MIL/uL (ref 4.22–5.81)
RDW: 13.9 % (ref 11.5–15.5)
WBC: 7.6 10*3/uL (ref 4.0–10.5)
nRBC: 0 % (ref 0.0–0.2)

## 2021-02-08 NOTE — Progress Notes (Signed)
Attempted report x1. 

## 2021-02-08 NOTE — TOC Transition Note (Signed)
Transition of Care [facility]) - CM/SW Discharge Note   Patient Details  Name: Kenneth Yu MRN: [id number] Date of Birth: 15-Aug-1955  [facility] CM/SW Contact:  Baldemar Lenis, LCSW Phone Number: 02/08/2021, 10:03 AM   Clinical Narrative:   Nurse to call report to [phone].    Final next level of care: Skilled Nursing Facility Barriers to Discharge: Barriers Resolved   Patient Goals and CMS Choice   CMS Medicare.gov Compare Post Acute Care list provided to:: Patient Choice offered to / list presented to : Patient  Discharge Placement              Patient chooses bed at:  ([facility]) Patient to be transferred to facility by: [facility] Name of family member notified: Tesha Patient and family notified of of transfer: 02/08/21  Discharge Plan and Services   Discharge Planning Services: CM Consult Post Acute Care Choice: IP Rehab                               Social Determinants of Health (SDOH) Interventions     Readmission Risk Interventions No flowsheet data found.

## 2021-02-08 NOTE — Plan of Care (Signed)
  Problem: Education: Goal: Knowledge of General Education information will improve Description: Including pain rating scale, medication(s)/side effects and non-pharmacologic comfort measures Outcome: Adequate for Discharge   Problem: Health Behavior/Discharge Planning: Goal: Ability to manage health-related needs will improve Outcome: Adequate for Discharge   Problem: Clinical Measurements: Goal: Ability to maintain clinical measurements within normal limits will improve Outcome: Adequate for Discharge Goal: Will remain free from infection Outcome: Adequate for Discharge Goal: Diagnostic test results will improve Outcome: Adequate for Discharge Goal: Respiratory complications will improve Outcome: Adequate for Discharge Goal: Cardiovascular complication will be avoided Outcome: Adequate for Discharge   Problem: Elimination: Goal: Will not experience complications related to bowel motility Outcome: Adequate for Discharge Goal: Will not experience complications related to urinary retention Outcome: Adequate for Discharge   Problem: Ischemic Stroke/TIA Tissue Perfusion: Goal: Complications of ischemic stroke/TIA will be minimized Outcome: Adequate for Discharge   Problem: Nutrition: Goal: Risk of aspiration will decrease Outcome: Adequate for Discharge Goal: Dietary intake will improve Outcome: Adequate for Discharge

## 2021-02-08 NOTE — Progress Notes (Signed)
Report given to Louann at [facility]

## 2021-02-12 ENCOUNTER — Encounter: Payer: Self-pay | Admitting: *Deleted

## 2021-02-12 NOTE — Progress Notes (Signed)
Patient ID: Kenneth Yu, male   DOB: 05/06/1956, 65 y.o.   MRN: [id number] Patient enrolled for [employer] to ship a 30 day cardiac event monitor to [name], in care of [facility] at [facility], [address] , [address], [place], [place]  [address], Attn: Dallas Breeding. Letter with instructions mailed to same address.

## 2021-02-15 ENCOUNTER — Ambulatory Visit (INDEPENDENT_AMBULATORY_CARE_PROVIDER_SITE_OTHER): Payer: Medicare (Managed Care)

## 2021-02-15 DIAGNOSIS — I639 Cerebral infarction, unspecified: Secondary | ICD-10-CM

## 2021-02-15 DIAGNOSIS — R000 Tachycardia, unspecified: Secondary | ICD-10-CM | POA: Diagnosis not present

## 2021-03-05 ENCOUNTER — Telehealth: Payer: Self-pay | Admitting: Student

## 2021-03-05 NOTE — Telephone Encounter (Signed)
Attempted to call nurse back to clarify. Was placed on hold for > . Will attempt later.

## 2021-03-05 NOTE — Telephone Encounter (Signed)
Gerarda Gunther from [facility] calling to see if the patient needs to continue wearing the heart monitor. She states he is not wearing it now and they do not have the phone part of the monitor. She says she thinks the name is [facility]. Phone: [phone]

## 2021-03-07 NOTE — Telephone Encounter (Signed)
Spoke with Gerarda Gunther who states that the patient lost the phone part of his monitor. He is no longer wearing it. She states that the company will send him a new one but he will be charged for the one he lost. She states that she thinks he was able to wear the monitor for about 10 days. She would like to know if that will be enough data or if he needs to get the new phone piece and continue to wear the monitor.

## 2021-03-19 ENCOUNTER — Inpatient Hospital Stay: Payer: Medicare HMO | Admitting: Adult Health

## 2021-04-11 NOTE — Progress Notes (Deleted)
PCP:  Default, Provider, MD Primary Cardiologist: None Electrophysiologist: Kenneth Prude, MD   Kenneth Yu is a 65 y.o. male seen today for Kenneth Prude, MD for post hospital follow up.  Since discharge from hospital the patient reports doing ***.  he denies chest pain, palpitations, dyspnea, PND, orthopnea, nausea, vomiting, dizziness, syncope, edema, weight gain, or early satiety.  Past Medical History:  Diagnosis Date   DVT (deep venous thrombosis) (HCC)    Hyperlipidemia    Hypertension    Myocardial infarction [facility])    Past Surgical History:  Procedure Laterality Date   IR CT HEAD LTD  01/28/2021   IR PERCUTANEOUS ART THROMBECTOMY/INFUSION INTRACRANIAL INC DIAG ANGIO  01/28/2021   RADIOLOGY WITH ANESTHESIA N/A 01/28/2021   Procedure: IR WITH ANESTHESIA -;  Surgeon: Julieanne Cotton, MD;  Location: [facility] OR;  Service: Radiology;  Laterality: N/A;    Current Outpatient Medications  Medication Sig Dispense Refill   acetaminophen (TYLENOL) 325 MG tablet Take 650 mg by mouth every 4 (four) hours as needed.     albuterol (PROVENTIL) (2.5 MG/3ML) 0.083% nebulizer solution Take 3 mLs (2.5 mg total) by nebulization every 4 (four) hours as needed for wheezing or shortness of breath. 90 mL 12   alum & mag hydroxide-simeth (MAALOX/MYLANTA) 200-200-20 MG/5ML suspension Take 15 mLs by mouth every 6 (six) hours as needed for indigestion or heartburn. 355 mL 0   amLODipine (NORVASC) 10 MG tablet Take 1 tablet (10 mg total) by mouth daily. 30 tablet 0   aspirin 81 MG tablet Take 81 mg by mouth daily.     atorvastatin (LIPITOR) 80 MG tablet Take 80 mg by mouth daily.     bisacodyl (DULCOLAX) 10 MG suppository Place 10 mg rectally daily as needed for moderate constipation.     docusate sodium (COLACE) 100 MG capsule Take 100 mg by mouth 2 (two) times daily.     famotidine (PEPCID) 20 MG tablet Take 1 tablet (20 mg total) by mouth 2 (two) times daily. 30 tablet 0   lisinopril  (PRINIVIL,ZESTRIL) 10 MG tablet Take 10 mg by mouth daily.     No current facility-administered medications for this visit.    No Known Allergies  Social History   Socioeconomic History   Marital status: Unknown    Spouse name: Not on file   Number of children: Not on file   Years of education: Not on file   Highest education level: Not on file  Occupational History   Occupation: divorced   Occupation: lives alone  Tobacco Use   Smoking status: Former   Smokeless tobacco: Not on file  Substance and Sexual Activity   Alcohol use: Yes   Drug use: Not on file   Sexual activity: Not on file  Other Topics Concern   Not on file  Social History Narrative   Divorced, lives alone, smoked in past for 34 year   Social Determinants of Health   Financial Resource Strain: Not on file  Food Insecurity: Not on file  Transportation Needs: Not on file  Physical Activity: Not on file  Stress: Not on file  Social Connections: Not on file  Intimate Partner Violence: Not on file     Review of Systems: All other systems reviewed and are otherwise negative except as noted above.  Physical Exam: There were no vitals filed for this visit.  GEN- The patient is well appearing, alert and oriented x 3 today.   HEENT: normocephalic, atraumatic; sclera clear,  conjunctiva pink; hearing intact; oropharynx clear; neck supple, no JVP Lymph- no cervical lymphadenopathy Lungs- Clear to ausculation bilaterally, normal work of breathing.  No wheezes, rales, rhonchi Heart- Regular rate and rhythm, no murmurs, rubs or gallops, PMI not laterally displaced GI- soft, non-tender, non-distended, bowel sounds present, no hepatosplenomegaly Extremities- no clubbing, cyanosis, or edema; DP/PT/radial pulses 2+ bilaterally MS- no significant deformity or atrophy Skin- warm and dry, no rash or lesion Psych- euthymic mood, full affect Neuro- strength and sensation are intact  EKG is not ordered.   Additional  studies reviewed include: Previous EP office notes. ***  30 day event monitor 02/2021 No AF detected. No sustained arrhythmias Rare atrial and ventricular ectopy.  Assessment and Plan:  1. Cryptogenic stroke ***   Graciella Freer, [place]  04/11/21 3:34 PM

## 2021-04-12 ENCOUNTER — Ambulatory Visit: Payer: Medicare (Managed Care) | Admitting: Student

## 2021-04-12 DIAGNOSIS — I639 Cerebral infarction, unspecified: Secondary | ICD-10-CM

## 2021-05-17 ENCOUNTER — Other Ambulatory Visit: Payer: Self-pay

## 2021-05-17 NOTE — Patient Outreach (Signed)
[facility]) Care Management  05/17/2021  Unique Searfoss 1955/08/22 [id number]   First telephone outreach attempt to obtain mRS.  SNF not able to transfer to patient    Vanice Sarah [facility] Management Assistant [phone]

## 2021-05-20 ENCOUNTER — Other Ambulatory Visit: Payer: Self-pay

## 2021-05-20 NOTE — Patient Outreach (Signed)
[facility]) Care Management  05/20/2021  Kenneth Yu 1956-06-12 [id number]   Second telephone outreach attempt to obtain mRS. No answer. Left message for returned call.  Vanice Sarah [facility] Management Assistant [phone]

## 2021-05-23 ENCOUNTER — Other Ambulatory Visit: Payer: Self-pay

## 2021-05-23 NOTE — Patient Outreach (Signed)
[facility]) Care Management  05/23/2021  Tajh Livsey 1955-12-01 [id number]   3 outreach attempts were completed to obtain mRs. mRs could not be obtained because patient never returned my calls. mRs=7    Vanice Sarah Care Management Assistant [phone]

## 2021-05-23 NOTE — Patient Outreach (Signed)
[facility]) Care Management  05/23/2021  Kenneth Yu 1955/08/29 [id number]   Telephone outreach to patient to obtain mRS was successfully completed. MRS= 4  Thank you, Vanice Sarah [facility] Care Management Assistant

## 2022-03-24 IMAGING — CT CT HEAD W/O CM
4 series · 16 of 47 positions shown, 18 images · non-contrast
Comparison: 01/30/2021

CLINICAL DATA: Left frontal headache

EXAM:
CT HEAD WITHOUT CONTRAST
TECHNIQUE: Contiguous axial images were obtained from the base of the skull
through the vertex without intravenous contrast.

[Series 3: head without · axial · non-contrast · 0.50mm/px · z∈[-36,+84]mm · 7 of 33 slices shown, 9 images]
[im 5/33  brain]
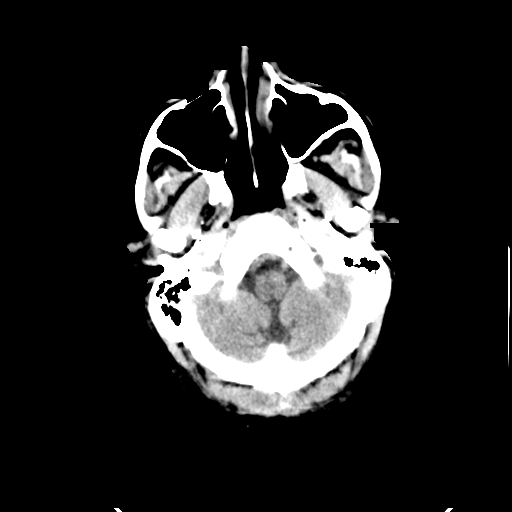
[im 5/33  bone]
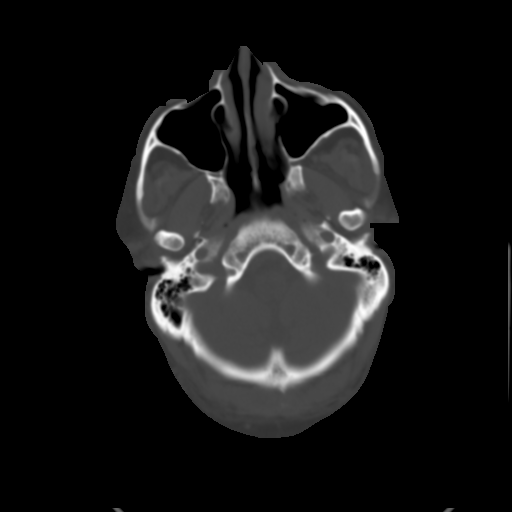
[im 9/33  brain]
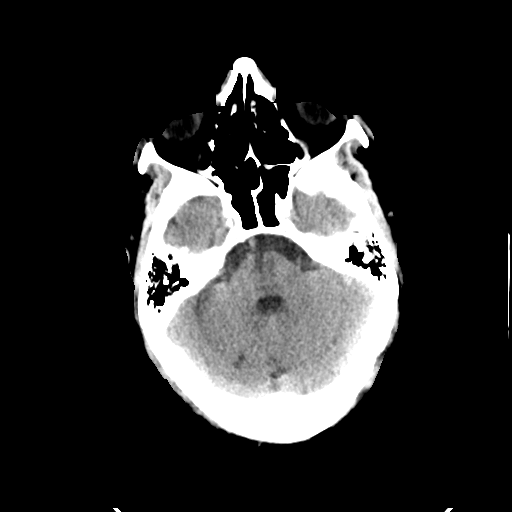
[im 13/33  brain]
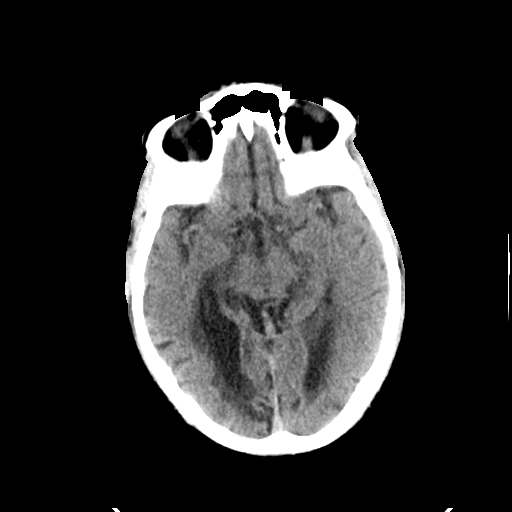
[im 17/33  brain]
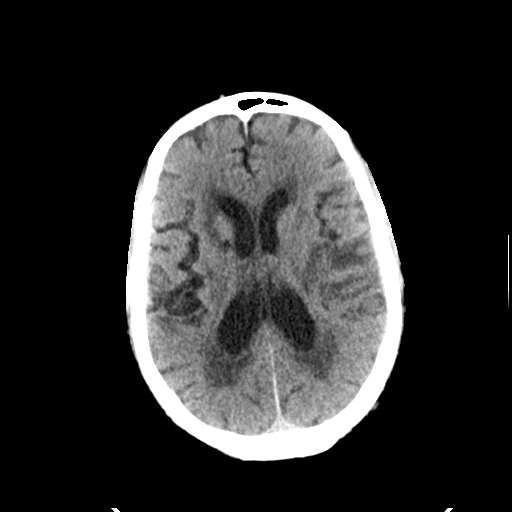
[im 21/33  brain]
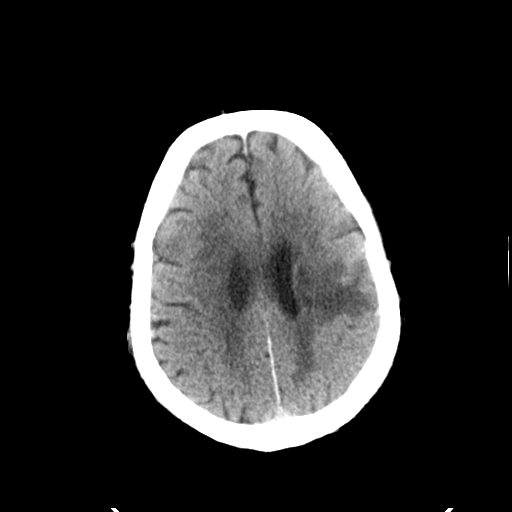
[im 21/33  bone]
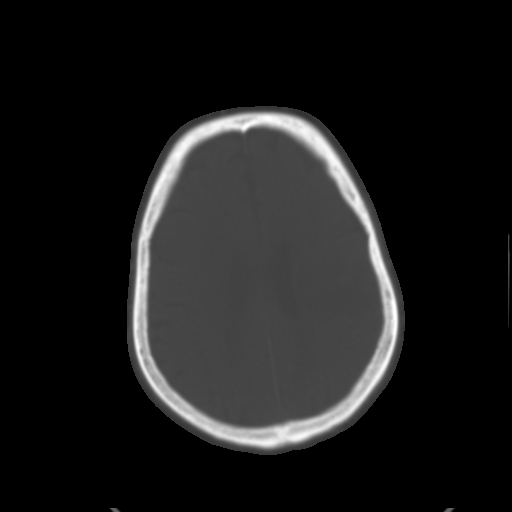
[im 25/33  brain]
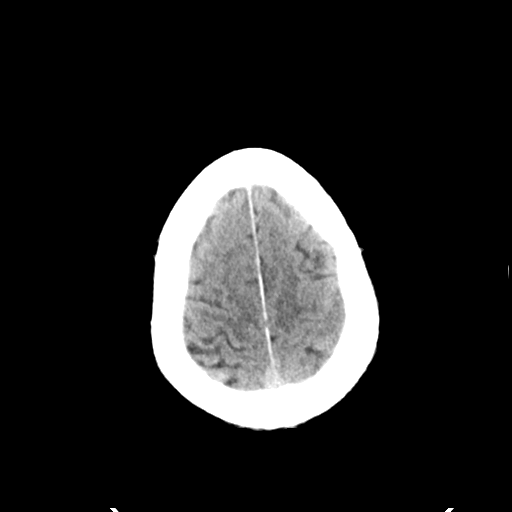
[im 29/33  brain]
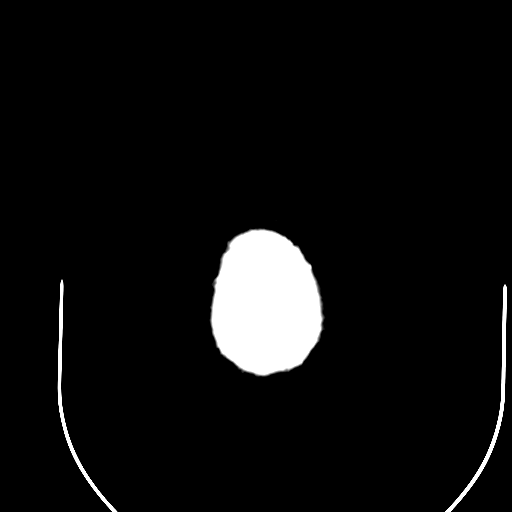

[Series 4: head bone · axial · 0.50mm/px · z∈[-40,-8]mm · 3 of 81 slices shown]
[im 9/81  bone]
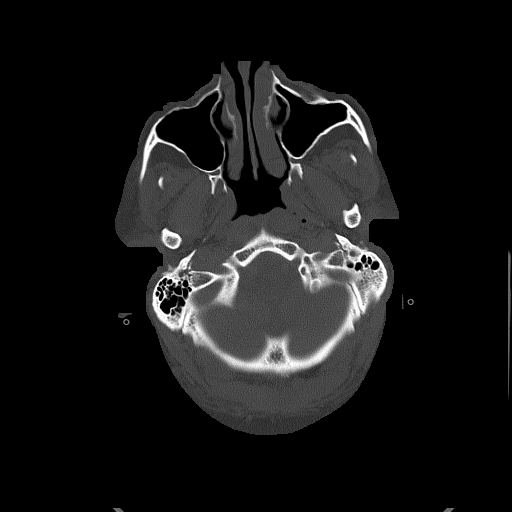
[im 17/81  bone]
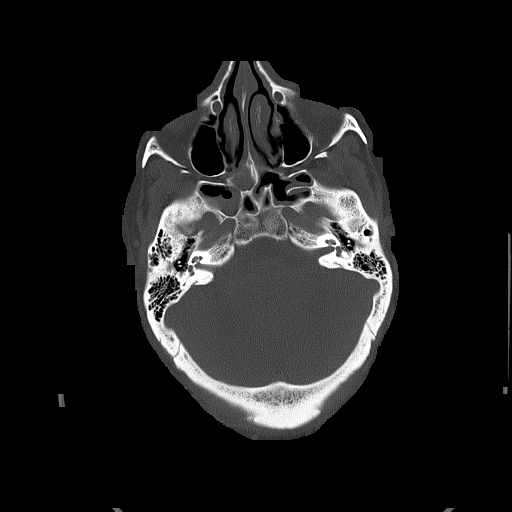
[im 25/81  bone]
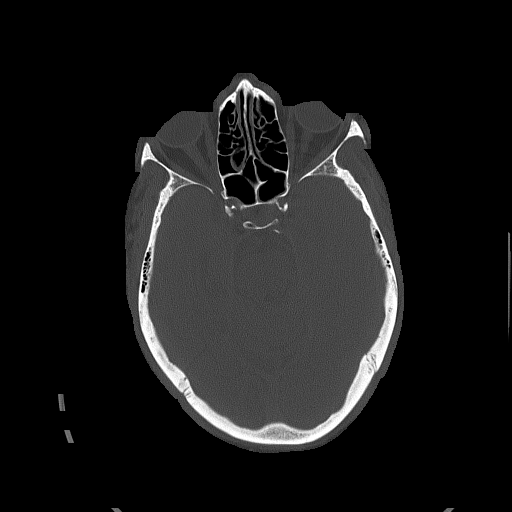

[Series 5: head without cor · coronal · non-contrast · 0.32mm/px · 3 of 77 slices shown]
[im 26/77  brain]
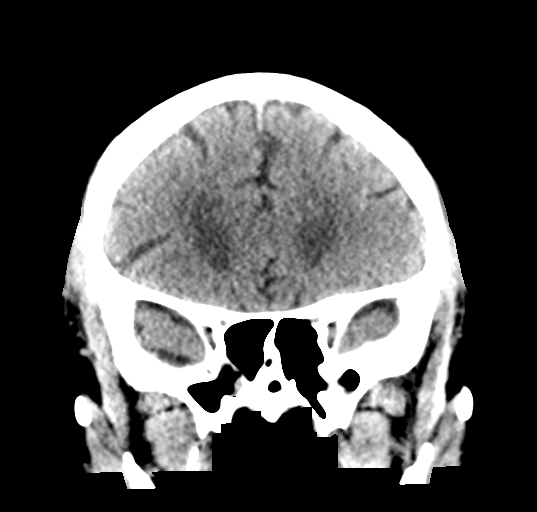
[im 34/77  brain]
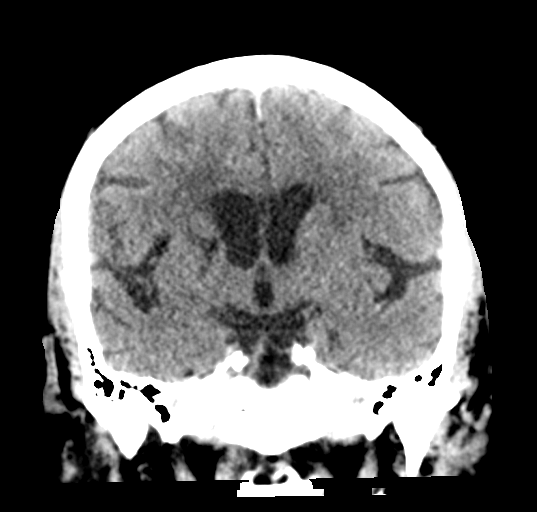
[im 43/77  brain]
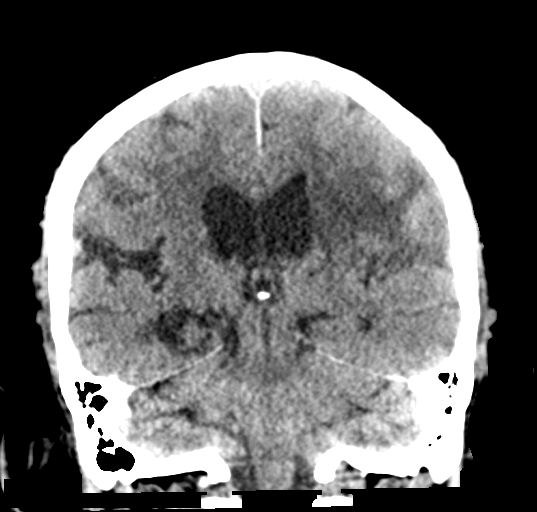

[Series 6: head without sag · sagittal · non-contrast · 0.32mm/px · 3 of 67 slices shown]
[im 23/67  brain]
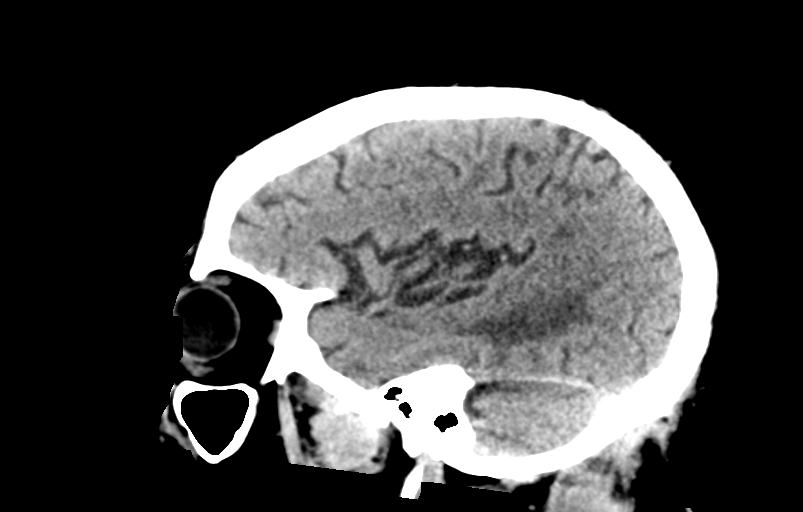
[im 34/67  brain]
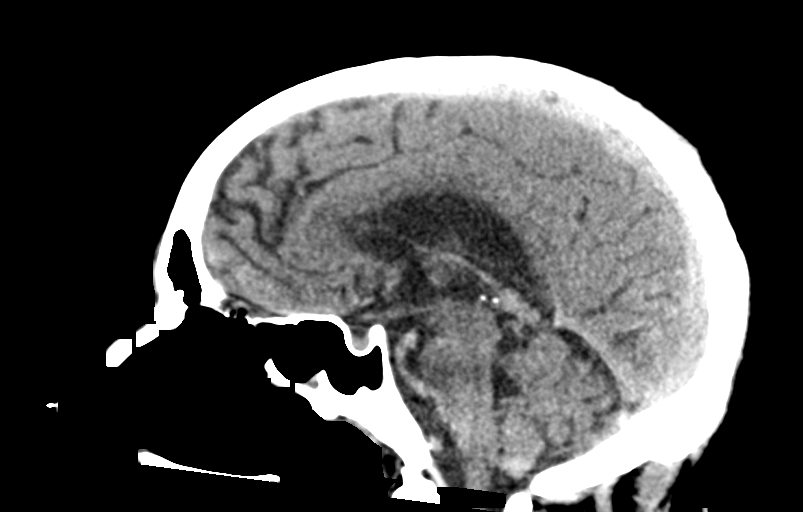
[im 45/67  brain]
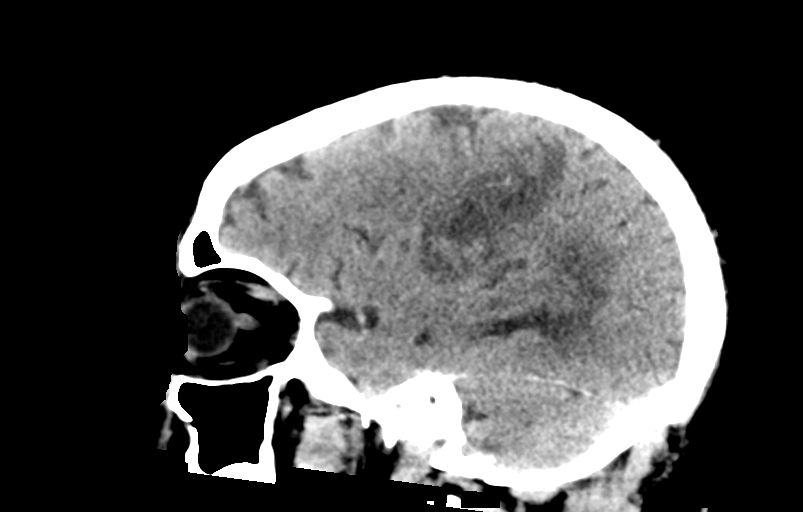

[16 of 47 positions shown; findings below may reference images not displayed]

FINDINGS: Brain: Evolving left MCA territory infarction is again identified.
Gyriform hyperdensity is again noted reflecting petechial
hemorrhage. This does not appear substantially changed from the
prior study. Additional smaller acute infarcts are better seen on
the prior MRI.

Chronic infarcts including involvement of right occipital lobe,
right medial temporal lobe, right caudate, and right cerebellum are
again identified. Stable findings of probable chronic microvascular
ischemic changes.

No significant mass effect.  No hydrocephalus.

Vascular: No new finding.

Skull: Calvarium is unremarkable.

Sinuses/Orbits: No acute finding.

Other: None.
IMPRESSION: Evolving recent left MCA territory infarction with similar petechial
hemorrhage. No significant mass effect.

## 2024-01-23 ENCOUNTER — Encounter (HOSPITAL_COMMUNITY): Payer: Self-pay | Admitting: Interventional Radiology
